# Patient Record
Sex: Female | Born: 1970
Health system: Southern US, Community
[De-identification: ages and names within clinical notes are randomized; demographics above are authoritative.]

## PROBLEM LIST (undated history)

## (undated) DIAGNOSIS — I519 Heart disease, unspecified: Secondary | ICD-10-CM

## (undated) DIAGNOSIS — Z85850 Personal history of malignant neoplasm of thyroid: Secondary | ICD-10-CM

## (undated) DIAGNOSIS — E663 Overweight: Secondary | ICD-10-CM

## (undated) DIAGNOSIS — Z9889 Other specified postprocedural states: Secondary | ICD-10-CM

## (undated) DIAGNOSIS — T7840XA Allergy, unspecified, initial encounter: Secondary | ICD-10-CM

## (undated) DIAGNOSIS — I2510 Atherosclerotic heart disease of native coronary artery without angina pectoris: Secondary | ICD-10-CM

## (undated) DIAGNOSIS — Z800 Family history of malignant neoplasm of digestive organs: Secondary | ICD-10-CM

## (undated) DIAGNOSIS — C801 Malignant (primary) neoplasm, unspecified: Secondary | ICD-10-CM

## (undated) DIAGNOSIS — M1990 Unspecified osteoarthritis, unspecified site: Secondary | ICD-10-CM

## (undated) DIAGNOSIS — R112 Nausea with vomiting, unspecified: Secondary | ICD-10-CM

## (undated) DIAGNOSIS — C50919 Malignant neoplasm of unspecified site of unspecified female breast: Secondary | ICD-10-CM

## (undated) DIAGNOSIS — K219 Gastro-esophageal reflux disease without esophagitis: Secondary | ICD-10-CM

## (undated) DIAGNOSIS — Z5189 Encounter for other specified aftercare: Secondary | ICD-10-CM

## (undated) DIAGNOSIS — D649 Anemia, unspecified: Secondary | ICD-10-CM

## (undated) DIAGNOSIS — E039 Hypothyroidism, unspecified: Secondary | ICD-10-CM

## (undated) HISTORY — DX: Anemia, unspecified: D64.9

## (undated) HISTORY — PX: COLONOSCOPY: SHX174

## (undated) HISTORY — DX: Gastro-esophageal reflux disease without esophagitis: K21.9

## (undated) HISTORY — PX: ULNAR NERVE REPAIR: SHX2594

## (undated) HISTORY — DX: Malignant neoplasm of unspecified site of unspecified female breast: C50.919

## (undated) HISTORY — DX: Heart disease, unspecified: I51.9

## (undated) HISTORY — DX: Unspecified osteoarthritis, unspecified site: M19.90

## (undated) HISTORY — DX: Nausea with vomiting, unspecified: R11.2

## (undated) HISTORY — DX: Other specified postprocedural states: Z98.890

## (undated) HISTORY — DX: Family history of malignant neoplasm of digestive organs: Z80.0

## (undated) HISTORY — DX: Malignant (primary) neoplasm, unspecified: C80.1

## (undated) HISTORY — PX: THYROIDECTOMY: SHX17

## (undated) HISTORY — DX: Encounter for other specified aftercare: Z51.89

## (undated) HISTORY — DX: Overweight: E66.3

## (undated) HISTORY — DX: Allergy, unspecified, initial encounter: T78.40XA

## (undated) HISTORY — DX: Gilbert syndrome: E80.4

## (undated) HISTORY — DX: Atherosclerotic heart disease of native coronary artery without angina pectoris: I25.10

## (undated) HISTORY — PX: OTHER SURGICAL HISTORY: SHX169

## (undated) HISTORY — DX: Personal history of malignant neoplasm of thyroid: Z85.850

## (undated) HISTORY — PX: UPPER GASTROINTESTINAL ENDOSCOPY: SHX188

## (undated) HISTORY — PX: BILATERAL TOTAL MASTECTOMY WITH AXILLARY LYMPH NODE DISSECTION: SHX6364

---
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
## 2001-11-16 ENCOUNTER — Ambulatory Visit (HOSPITAL_BASED_OUTPATIENT_CLINIC_OR_DEPARTMENT_OTHER): Admission: RE | Admit: 2001-11-16 | Discharge: 2001-11-16 | Payer: Self-pay | Admitting: Orthopaedic Surgery

## 2002-07-21 ENCOUNTER — Encounter: Admission: RE | Admit: 2002-07-21 | Discharge: 2002-07-21 | Payer: Self-pay | Admitting: Family Medicine

## 2002-07-21 ENCOUNTER — Encounter: Payer: Self-pay | Admitting: Family Medicine

## 2004-06-11 ENCOUNTER — Encounter: Admission: RE | Admit: 2004-06-11 | Discharge: 2004-06-11 | Payer: Self-pay | Admitting: Family Medicine

## 2004-08-05 ENCOUNTER — Other Ambulatory Visit: Admission: RE | Admit: 2004-08-05 | Discharge: 2004-08-05 | Payer: Self-pay | Admitting: Family Medicine

## 2004-10-14 ENCOUNTER — Encounter: Admission: RE | Admit: 2004-10-14 | Discharge: 2004-10-14 | Payer: Self-pay | Admitting: Family Medicine

## 2005-01-13 DIAGNOSIS — C801 Malignant (primary) neoplasm, unspecified: Secondary | ICD-10-CM

## 2005-01-13 HISTORY — DX: Malignant (primary) neoplasm, unspecified: C80.1

## 2006-01-07 ENCOUNTER — Other Ambulatory Visit: Admission: RE | Admit: 2006-01-07 | Discharge: 2006-01-07 | Payer: Self-pay | Admitting: Family Medicine

## 2006-08-18 ENCOUNTER — Emergency Department (HOSPITAL_COMMUNITY): Admission: EM | Admit: 2006-08-18 | Discharge: 2006-08-19 | Payer: Self-pay | Admitting: Emergency Medicine

## 2008-07-06 ENCOUNTER — Other Ambulatory Visit: Admission: RE | Admit: 2008-07-06 | Discharge: 2008-07-06 | Payer: Self-pay | Admitting: Family Medicine

## 2010-05-28 NOTE — Op Note (Signed)
NAMETEARIA, GIBBS NO.:  [id number]   MEDICAL RECORD NO.:  [id number]          PATIENT TYPE:  EMS   LOCATION:  MAJO                         FACILITY:  [facility]   PHYSICIAN:  Hermelinda Medicus, M.D.   DATE OF BIRTH:  Jan 31, 1970   DATE OF PROCEDURE:  08/19/2006  DATE OF DISCHARGE:                               OPERATIVE REPORT   This patient is a 40 year old female who, while at a baseball game, was  hit by a fly ball.  The ball hit her right in the frontal region, right  just below the hairline and causing a stellate crushing-type laceration.  The superior aspect of this was 4 cm, the inferior 3 cm, and the lateral  1 cm and 2 cm in size; with some crushing of tissue in the center, and  this cut right down to her skull.  There was no evidence of any skull  fracture.  She was not unconscious.  This was closed after cleansing  with Betadine and anesthetizing with 1% Xylocaine with epinephrine.  Closed with 5-0 chromic catgut subcutaneous, and then with 5-0 Ethilon.  Once this was completed, the area was shown to her husband where the  very center of the stellate laceration was somewhat tenuous in terms of  tissue damage, and could need to be revised.  However, she did very well  and Steri-Strips were applied.  A dressing was applied.  We will follow  with her on the second postoperative day, and then we will see her again  next week for suture removal.   MEDICATIONS:  She will go home with:  1. Amoxicillin 250 3 times a day.  2. Vicodin 1 tablet q.4-6 h. p.r.n.   ALLERGIES:  SHE HAS NO ALLERGIES TO MEDICATIONS.   DIAGNOSIS:  Stellate laceration of frontal region, trauma by a baseball.           ______________________________  Hermelinda Medicus, M.D.     JC/MEDQ  D:  08/19/2006  T:  08/19/2006  Job:  [id number]

## 2010-05-31 NOTE — Op Note (Signed)
NAME:  [name], [name]                      ACCOUNT NO.:  [id number]   MEDICAL RECORD NO.:  [id number]                   PATIENT TYPE:  AMB   LOCATION:  DSC                                  FACILITY:  [facility]   PHYSICIAN:  Lubertha Basque. Jerl Santos, M.D.             DATE OF BIRTH:  [birth year]   DATE OF PROCEDURE:  11/16/2001  DATE OF DISCHARGE:                                 OPERATIVE REPORT   PREOPERATIVE DIAGNOSIS:  Right ulnar nerve instability.   POSTOPERATIVE DIAGNOSIS:  Right ulnar nerve instability.   PROCEDURE:  Right elbow medial epicondylectomy.   ANESTHESIA:  General.   SURGEON:  Lubertha Basque. Jerl Santos, M.D.   ASSISTANT:  Lindwood Qua, P.A.   INDICATION FOR PROCEDURE:  The patient is a 40 year old woman 10 or 15 years  out from what sounds like an ulnar nerve decompression procedure.  She did  fairly well until over the last five years she developed a pop in her elbow  with flexion.  This would shoot a pain down into her hand.  This became  unpleasant to her.  This has persisted despite activity restriction and anti-  inflammatories.  She is offered a medial epicondylectomy with re-exploration  of the ulnar nerve.  The procedure was discussed with the patient and  informed operative consent was obtained after a discussion of the possible  complications of, reaction to anesthesia, infection, and neurovascular  injury.   DESCRIPTION OF PROCEDURE:  The patient was taken to the operating suite,  where a general anesthetic was applied without difficulty.  She was  positioned supine and prepped and draped in a normal sterile fashion.  After  the administration of preop IV antibiotics, the right arm was elevated,  exsanguinated, and a tourniquet was inflated about the upper arm.  Her old  incision was utilized with dissection down to an ulnar nerve which was  encased in scar tissue.  Once this was decompressed, upon flexion the ulnar  nerve could be seen to flip out of the  groove.  The flexor-pronator origin  was dissected off the medial epicondyle in anterior and posterior  directions.  An oscillating saw was used to perform a medial  epicondylectomy.  The edge of this was smoothed with a rongeur, followed by  repair of the flexor-pronator origin with Vicryl suture.  At this point the  elbow moved well and the ulnar nerve no longer subluxed over any prominence.  Dissection was carried and taken proximally to the intermuscular septum,  which was released around the nerve and distally into the FCU muscle belly,  where the nerve seemed to be very irritated.  The wound was again irrigated,  followed by release of the tourniquet.  The arm became pink and warm  immediately, and a small amount of bleeding was easily controlled with Bovie  cautery.  Undyed 2-0 Vicryl was used to reapproximate the subcutaneous  tissues, followed by a subcuticular Vicryl  and Steri-Strips.  Marcaine was  injected about the wound, followed by dry gauze and a posterior splint of  plaster with the elbow in neutral position.  Estimated blood loss and  intraoperative fluid as well as accurate tourniquet time can be obtained  from anesthesia records.    DISPOSITION:  The patient was extubated in the operating room and taken to  the recovery room in stable condition.  Plans were for her to go home the  same day and follow up in the office in less than a week.  I will contact  her by phone tonight.                                               Lubertha Basque Jerl Santos, M.D.    PGD/MEDQ  D:  11/16/2001  T:  11/16/2001  Job:  [id number]

## 2010-09-11 ENCOUNTER — Emergency Department (INDEPENDENT_AMBULATORY_CARE_PROVIDER_SITE_OTHER): Payer: BC Managed Care – PPO

## 2010-09-11 ENCOUNTER — Emergency Department (HOSPITAL_BASED_OUTPATIENT_CLINIC_OR_DEPARTMENT_OTHER)
Admission: EM | Admit: 2010-09-11 | Discharge: 2010-09-11 | Disposition: A | Discharge status: Other Acute Inpatient Hospital | Payer: BC Managed Care – PPO | Source: Home / Self Care | Attending: Emergency Medicine | Admitting: Emergency Medicine

## 2010-09-11 ENCOUNTER — Inpatient Hospital Stay (HOSPITAL_COMMUNITY)
Admission: AD | Admit: 2010-09-11 | Discharge: 2010-09-16 | DRG: 546 | Disposition: A | Discharge status: Home / Self Care | Payer: BC Managed Care – PPO | Source: Other Acute Inpatient Hospital | Attending: Cardiothoracic Surgery | Admitting: Cardiothoracic Surgery

## 2010-09-11 ENCOUNTER — Encounter: Payer: Self-pay | Admitting: *Deleted

## 2010-09-11 DIAGNOSIS — E039 Hypothyroidism, unspecified: Secondary | ICD-10-CM | POA: Insufficient documentation

## 2010-09-11 DIAGNOSIS — R55 Syncope and collapse: Secondary | ICD-10-CM | POA: Diagnosis present

## 2010-09-11 DIAGNOSIS — R0602 Shortness of breath: Secondary | ICD-10-CM | POA: Insufficient documentation

## 2010-09-11 DIAGNOSIS — I214 Non-ST elevation (NSTEMI) myocardial infarction: Secondary | ICD-10-CM | POA: Diagnosis present

## 2010-09-11 DIAGNOSIS — Z85850 Personal history of malignant neoplasm of thyroid: Secondary | ICD-10-CM

## 2010-09-11 DIAGNOSIS — I472 Ventricular tachycardia, unspecified: Secondary | ICD-10-CM | POA: Diagnosis not present

## 2010-09-11 DIAGNOSIS — R079 Chest pain, unspecified: Secondary | ICD-10-CM

## 2010-09-11 DIAGNOSIS — E876 Hypokalemia: Secondary | ICD-10-CM | POA: Diagnosis present

## 2010-09-11 DIAGNOSIS — E785 Hyperlipidemia, unspecified: Secondary | ICD-10-CM | POA: Diagnosis present

## 2010-09-11 DIAGNOSIS — K219 Gastro-esophageal reflux disease without esophagitis: Secondary | ICD-10-CM | POA: Diagnosis present

## 2010-09-11 DIAGNOSIS — R42 Dizziness and giddiness: Secondary | ICD-10-CM | POA: Insufficient documentation

## 2010-09-11 DIAGNOSIS — D62 Acute posthemorrhagic anemia: Secondary | ICD-10-CM | POA: Diagnosis not present

## 2010-09-11 DIAGNOSIS — I2542 Coronary artery dissection: Principal | ICD-10-CM | POA: Diagnosis present

## 2010-09-11 DIAGNOSIS — I4729 Other ventricular tachycardia: Secondary | ICD-10-CM | POA: Diagnosis not present

## 2010-09-11 HISTORY — DX: Hypothyroidism, unspecified: E03.9

## 2010-09-11 LAB — BASIC METABOLIC PANEL
BUN: 14 mg/dL (ref 6–23)
CO2: 21 mEq/L (ref 19–32)
Calcium: 8.7 mg/dL (ref 8.4–10.5)
Chloride: 106 mEq/L (ref 96–112)
Creatinine, Ser: 1 mg/dL (ref 0.50–1.10)
GFR calc Af Amer: >60 mL/min (ref >60)
GFR calc non Af Amer: >60 mL/min (ref >60)
Glucose, Bld: 98 mg/dL (ref 70–99)
Potassium: 3.4 mEq/L — ABNORMAL LOW (ref 3.5–5.1)
Sodium: 138 mEq/L (ref 135–145)

## 2010-09-11 LAB — URINALYSIS, ROUTINE W REFLEX MICROSCOPIC
Bilirubin Urine: NEGATIVE
Glucose, UA: NEGATIVE mg/dL
Hgb urine dipstick: NEGATIVE
Ketones, ur: NEGATIVE mg/dL
Nitrite: NEGATIVE
Protein, ur: NEGATIVE mg/dL
Specific Gravity, Urine: 1.008 (ref 1.005–1.030)
Urobilinogen, UA: 0.2 mg/dL (ref 0.0–1.0)
pH: 7 (ref 5.0–8.0)

## 2010-09-11 LAB — CARDIAC PANEL(CRET KIN+CKTOT+MB+TROPI)
CK, MB: 3.8 ng/mL (ref 0.3–4.0)
Relative Index: 2.9 — ABNORMAL HIGH (ref 0.0–2.5)
Total CK: 131 U/L (ref 7–177)
Troponin I: <0.3 ng/mL (ref <0.30)

## 2010-09-11 LAB — TROPONIN I: Troponin I: <0.3 ng/mL (ref <0.30)

## 2010-09-11 LAB — URINE MICROSCOPIC-ADD ON

## 2010-09-11 LAB — CBC
HCT: 37.7 % (ref 36.0–46.0)
Hemoglobin: 13.3 g/dL (ref 12.0–15.0)
MCH: 28.9 pg (ref 26.0–34.0)
MCHC: 35.3 g/dL (ref 30.0–36.0)
MCV: 81.8 fL (ref 78.0–100.0)
Platelets: 321 10*3/uL (ref 150–400)
RBC: 4.61 MIL/uL (ref 3.87–5.11)
RDW: 12.9 % (ref 11.5–15.5)
WBC: 16.2 10*3/uL — ABNORMAL HIGH (ref 4.0–10.5)

## 2010-09-11 MED ORDER — SODIUM CHLORIDE 0.9 % IV SOLN
Freq: Once | INTRAVENOUS | Status: AC
  Administered 2010-09-11: 15:00:00 via INTRAVENOUS

## 2010-09-11 MED ORDER — ENOXAPARIN SODIUM 100 MG/ML ~~LOC~~ SOLN: 1.0000 mg/kg | Freq: Once | SUBCUTANEOUS | Status: DC

## 2010-09-11 MED ORDER — SODIUM CHLORIDE 0.9 % IV SOLN
Freq: Once | INTRAVENOUS | Status: AC
  Administered 2010-09-11: 16:00:00 via INTRAVENOUS

## 2010-09-11 MED ORDER — ENOXAPARIN SODIUM 150 MG/ML ~~LOC~~ SOLN
SUBCUTANEOUS | Status: AC
  Administered 2010-09-11: 97 mg
  Filled 2010-09-11: qty 1

## 2010-09-11 NOTE — ED Notes (Signed)
Iv charted "removed" for documentation purposes only. IV remains intact at time of transfer. Report given to [facility] and they are on their way for transport

## 2010-09-11 NOTE — ED Notes (Signed)
Patient is resting comfortably.family at side pr reports "feeling much better Now" denies any discomfort Care plan reviewed No distress Improved overall Vital signs stable

## 2010-09-11 NOTE — ED Notes (Signed)
Report given to Tai RN on 4700. Pt transported to bed 4729 by [facility] who are here for transport.

## 2010-09-11 NOTE — ED Provider Notes (Signed)
History     CSN: [id number] Arrival date & time: 09/11/2010  2:19 PM  Chief Complaint  Patient presents with  . Chest Pain    Pt reports chest pain and diaphoresis while running today resolved,given asa 325mg  and .4SL Nitro per EMS   3/10 chest   Patient is a 40 y.o. female presenting with chest pain. The history is provided by the patient and the EMS personnel.  Chest Pain The chest pain began 1 - 2 hours ago. Duration of episode(s) is 1 hour. Chest pain occurs constantly. The chest pain is improving. The pain is associated with exertion. The severity of the pain is moderate. The quality of the pain is described as tightness (to throat/neck area, upper sternum). Primary symptoms include fatigue, shortness of breath, nausea and dizziness. Pertinent negatives for primary symptoms include no fever, no syncope, no cough, no wheezing, no palpitations, no abdominal pain and no vomiting.  Dizziness also occurs with nausea and diaphoresis. Dizziness does not occur with vomiting or weakness.   Associated symptoms include diaphoresis and near-syncope.  Pertinent negatives for associated symptoms include no lower extremity edema, no numbness and no weakness. She tried nitroglycerin and aspirin (given by EMS PTA) for the symptoms. Risk factors include obesity.  Her past medical history is significant for thyroid problem.  Her family medical history is significant for heart disease in family.  Procedure history is negative for cardiac catheterization, stress echo and exercise treadmill test.     Past Medical History  Diagnosis Date  . Hypothyroid     Past Surgical History  Procedure Date  . Thyroidectomy     History reviewed. No pertinent family history.  History  Substance Use Topics  . Smoking status: Never Smoker   . Smokeless tobacco: Not on file  . Alcohol Use: No    OB History    Grav Para Term Preterm Abortions TAB SAB Ect Mult Living                  Review of Systems    Constitutional: Positive for diaphoresis and fatigue. Negative for fever and chills.  HENT: Negative for congestion, rhinorrhea and sneezing.   Eyes: Negative.   Respiratory: Positive for shortness of breath. Negative for cough, chest tightness and wheezing.   Cardiovascular: Positive for chest pain and near-syncope. Negative for palpitations, leg swelling and syncope.  Gastrointestinal: Positive for nausea. Negative for vomiting, abdominal pain, diarrhea and blood in stool.  Genitourinary: Negative for frequency, hematuria, flank pain and difficulty urinating.  Musculoskeletal: Negative for back pain and arthralgias.  Skin: Positive for pallor. Negative for rash.  Neurological: Positive for dizziness. Negative for speech difficulty, weakness, numbness and headaches.    Physical Exam  BP 91/55  Pulse 82  Temp(Src) 97.9 F (36.6 C) (Oral)  Resp 18  SpO2 100%  Physical Exam  Constitutional: She is oriented to person, place, and time. She appears well-developed and well-nourished.  HENT:  Head: Normocephalic and atraumatic.  Eyes: Pupils are equal, round, and reactive to light.  Neck: Normal range of motion. Neck supple. No tracheal deviation present. No thyromegaly present.  Cardiovascular: Normal rate, regular rhythm, normal heart sounds and intact distal pulses.  Exam reveals no gallop and no friction rub.   No murmur heard. Pulmonary/Chest: Effort normal and breath sounds normal. No respiratory distress. She has no wheezes. She has no rales. She exhibits no tenderness.  Abdominal: Soft. Bowel sounds are normal. There is no tenderness. There is no rebound  and no guarding.  Musculoskeletal: Normal range of motion. She exhibits no edema and no tenderness.  Lymphadenopathy:    She has no cervical adenopathy.  Neurological: She is alert and oriented to person, place, and time.  Skin: Skin is warm and dry. No rash noted. There is pallor.  Psychiatric: She has a normal mood and affect.     ED Course  Procedures  Pt hypotensive on arrival with BP in 90s, dropped down to 70s.  HR was dropping down intor 30s, now stable in 70s-80s.  Pt's tightness to throat improving.  Getting fluid bolus, on oxygen  MDM     Results for orders placed during the hospital encounter of 09/11/10  CBC      Component Value Range   WBC 16.2 (*) 4.0 - 10.5 (K/uL)   RBC 4.61  3.87 - 5.11 (MIL/uL)   Hemoglobin 13.3  12.0 - 15.0 (g/dL)   HCT 16.1  09.6 - 04.5 (%)   MCV 81.8  78.0 - 100.0 (fL)   MCH 28.9  26.0 - 34.0 (pg)   MCHC 35.3  30.0 - 36.0 (g/dL)   RDW 40.9  81.1 - 91.4 (%)   Platelets 321  150 - 400 (K/uL)  BASIC METABOLIC PANEL      Component Value Range   Sodium 138  135 - 145 (mEq/L)   Potassium 3.4 (*) 3.5 - 5.1 (mEq/L)   Chloride 106  96 - 112 (mEq/L)   CO2 21  19 - 32 (mEq/L)   Glucose, Bld 98  70 - 99 (mg/dL)   BUN 14  6 - 23 (mg/dL)   Creatinine, Ser 7.82  0.50 - 1.10 (mg/dL)   Calcium 8.7  8.4 - 95.6 (mg/dL)   GFR calc non Af Amer >60  >60 (mL/min)   GFR calc Af Amer >60  >60 (mL/min)  TROPONIN I      Component Value Range   Troponin I <0.30  <0.30 (ng/mL)  CARDIAC PANEL(CRET KIN+CKTOT+MB+TROPI)      Component Value Range   Total CK 131  7 - 177 (U/L)   CK, MB 3.8  0.3 - 4.0 (ng/mL)   Troponin I <0.30  <0.30 (ng/mL)   Relative Index 2.9 (*) 0.0 - 2.5    Dg Chest 2 View  09/11/2010  *RADIOLOGY REPORT*  Clinical Data: Chest pain.  CHEST - 2 VIEW  Comparison: None  Findings: The cardiac silhouette, mediastinal and hilar contours are within normal limits.  The lungs are clear.  The bony thorax is intact.  IMPRESSION: No acute cardiopulmonary findings.  Original Report Authenticated By: P. Loralie Champagne, M.D.    EKG: there are no previous tracings available for comparison, sinus bradycardia, nl axis, no ST/T wave changes.  Discussed with Hospitalist at [facility], will be admitted to [facility], MD 09/11/10 [phone]

## 2010-09-11 NOTE — ED Notes (Signed)
Report received from [facility]. Assuming care of pt at this time. Pt denies CP or associated symptoms. Vitals stable. Family at bedside. Pt aware of plans for admission.

## 2010-09-11 NOTE — ED Notes (Signed)
MD at bedside.Variable heart rate with drop in Blood Pressure MD in at side Fluid bolus in progress

## 2010-09-11 NOTE — ED Notes (Signed)
Pt had 7-10 minute episode of chest pain with diaphoresis/today while running

## 2010-09-11 NOTE — ED Notes (Signed)
Pt reports onset of mid sternal chest pain/ tightness. Began around 12:30 while running/ walking. Pt denies any hx of heart disease. Non smoker. At moment pt reports still some chest tightness but no pain.

## 2010-09-12 ENCOUNTER — Inpatient Hospital Stay (HOSPITAL_COMMUNITY): Payer: BC Managed Care – PPO

## 2010-09-12 DIAGNOSIS — I214 Non-ST elevation (NSTEMI) myocardial infarction: Secondary | ICD-10-CM

## 2010-09-12 DIAGNOSIS — I2510 Atherosclerotic heart disease of native coronary artery without angina pectoris: Secondary | ICD-10-CM

## 2010-09-12 LAB — POCT I-STAT 4, (NA,K, GLUC, HGB,HCT)
Glucose, Bld: 93 mg/dL (ref 70–99)
Glucose, Bld: 94 mg/dL (ref 70–99)
Glucose, Bld: 93 mg/dL (ref 70–99)
Glucose, Bld: 109 mg/dL — ABNORMAL HIGH (ref 70–99)
HCT: 32 % — ABNORMAL LOW (ref 36.0–46.0)
HCT: 25 % — ABNORMAL LOW (ref 36.0–46.0)
HCT: 30 % — ABNORMAL LOW (ref 36.0–46.0)
HCT: 34 % — ABNORMAL LOW (ref 36.0–46.0)
Hemoglobin: 10.9 g/dL — ABNORMAL LOW (ref 12.0–15.0)
Hemoglobin: 10.2 g/dL — ABNORMAL LOW (ref 12.0–15.0)
Hemoglobin: 8.8 g/dL — ABNORMAL LOW (ref 12.0–15.0)
Hemoglobin: 11.6 g/dL — ABNORMAL LOW (ref 12.0–15.0)
Potassium: 3.8 mEq/L (ref 3.5–5.1)
Potassium: 4.3 mEq/L (ref 3.5–5.1)
Potassium: 3.6 mEq/L (ref 3.5–5.1)
Sodium: 140 mEq/L (ref 135–145)
Sodium: 140 mEq/L (ref 135–145)
Sodium: 141 mEq/L (ref 135–145)
Sodium: 148 mEq/L — ABNORMAL HIGH (ref 135–145)

## 2010-09-12 LAB — POCT I-STAT 3, ART BLOOD GAS (G3+)
Acid-base deficit: 5 mmol/L — ABNORMAL HIGH (ref 0.0–2.0)
Acid-base deficit: 5 mmol/L — ABNORMAL HIGH (ref 0.0–2.0)
Acid-base deficit: 5 mmol/L — ABNORMAL HIGH (ref 0.0–2.0)
Bicarbonate: 24.6 mEq/L — ABNORMAL HIGH (ref 20.0–24.0)
Bicarbonate: 19.4 mEq/L — ABNORMAL LOW (ref 20.0–24.0)
O2 Saturation: 97 %
O2 Saturation: 98 %
Patient temperature: 37
TCO2: 21 mmol/L (ref 0–100)
TCO2: 20 mmol/L (ref 0–100)
pCO2 arterial: 38.4 mmHg (ref 35.0–45.0)
pCO2 arterial: 41 mmHg (ref 35.0–45.0)
pH, Arterial: 7.318 — ABNORMAL LOW (ref 7.350–7.400)
pH, Arterial: 7.386 (ref 7.350–7.400)
pH, Arterial: 7.39 (ref 7.350–7.400)
pO2, Arterial: 494 mmHg — ABNORMAL HIGH (ref 80.0–100.0)
pO2, Arterial: 365 mmHg — ABNORMAL HIGH (ref 80.0–100.0)
pO2, Arterial: 414 mmHg — ABNORMAL HIGH (ref 80.0–100.0)
pO2, Arterial: 90 mmHg (ref 80.0–100.0)

## 2010-09-12 LAB — CBC
HCT: 34.3 % — ABNORMAL LOW (ref 36.0–46.0)
HCT: 28.3 % — ABNORMAL LOW (ref 36.0–46.0)
HCT: 30.3 % — ABNORMAL LOW (ref 36.0–46.0)
Hemoglobin: 9.7 g/dL — ABNORMAL LOW (ref 12.0–15.0)
Hemoglobin: 10.4 g/dL — ABNORMAL LOW (ref 12.0–15.0)
MCH: 28 pg (ref 26.0–34.0)
MCH: 28 pg (ref 26.0–34.0)
MCHC: 34.3 g/dL (ref 30.0–36.0)
MCHC: 34.3 g/dL (ref 30.0–36.0)
MCV: 81.1 fL (ref 78.0–100.0)
MCV: 81.8 fL (ref 78.0–100.0)
MCV: 81.7 fL (ref 78.0–100.0)
Platelets: 256 10*3/uL (ref 150–400)
Platelets: 263 10*3/uL (ref 150–400)
Platelets: 182 10*3/uL (ref 150–400)
Platelets: 207 10*3/uL (ref 150–400)
RBC: 4.41 MIL/uL (ref 3.87–5.11)
RBC: 4.23 MIL/uL (ref 3.87–5.11)
RBC: 3.46 MIL/uL — ABNORMAL LOW (ref 3.87–5.11)
RBC: 3.71 MIL/uL — ABNORMAL LOW (ref 3.87–5.11)
RDW: 12.8 % (ref 11.5–15.5)
RDW: 12.8 % (ref 11.5–15.5)
RDW: 12.9 % (ref 11.5–15.5)
RDW: 13 % (ref 11.5–15.5)
WBC: 10.1 10*3/uL (ref 4.0–10.5)
WBC: 8.4 10*3/uL (ref 4.0–10.5)
WBC: 14.6 10*3/uL — ABNORMAL HIGH (ref 4.0–10.5)
WBC: 18.7 10*3/uL — ABNORMAL HIGH (ref 4.0–10.5)

## 2010-09-12 LAB — CK TOTAL AND CKMB (NOT AT ARMC)
CK, MB: 25 ng/mL (ref 0.3–4.0)
Total CK: 291 U/L — ABNORMAL HIGH (ref 7–177)

## 2010-09-12 LAB — MRSA PCR SCREENING
MRSA by PCR: NEGATIVE
MRSA by PCR: NEGATIVE

## 2010-09-12 LAB — MAGNESIUM: Magnesium: 2.7 mg/dL — ABNORMAL HIGH (ref 1.5–2.5)

## 2010-09-12 LAB — POCT I-STAT, CHEM 8
BUN: 6 mg/dL (ref 6–23)
Calcium, Ion: 1.08 mmol/L — ABNORMAL LOW (ref 1.12–1.32)
Creatinine, Ser: 0.8 mg/dL (ref 0.50–1.10)
Glucose, Bld: 134 mg/dL — ABNORMAL HIGH (ref 70–99)
Hemoglobin: 9.9 g/dL — ABNORMAL LOW (ref 12.0–15.0)
Sodium: 139 mEq/L (ref 135–145)
TCO2: 18 mmol/L (ref 0–100)

## 2010-09-12 LAB — CARDIAC PANEL(CRET KIN+CKTOT+MB+TROPI)
CK, MB: 41.1 ng/mL (ref 0.3–4.0)
Relative Index: 9.4 — ABNORMAL HIGH (ref 0.0–2.5)
Relative Index: 8.5 — ABNORMAL HIGH (ref 0.0–2.5)
Total CK: 436 U/L — ABNORMAL HIGH (ref 7–177)
Total CK: 293 U/L — ABNORMAL HIGH (ref 7–177)
Troponin I: 13.56 ng/mL (ref <0.30)

## 2010-09-12 LAB — PROTIME-INR
INR: 1.1 (ref 0.00–1.49)
INR: 4.07 — ABNORMAL HIGH (ref 0.00–1.49)
INR: 1.44 (ref 0.00–1.49)
Prothrombin Time: 14.4 seconds (ref 11.6–15.2)
Prothrombin Time: 17.8 seconds — ABNORMAL HIGH (ref 11.6–15.2)

## 2010-09-12 LAB — CREATININE, SERUM
Creatinine, Ser: 0.73 mg/dL (ref 0.50–1.10)
GFR calc Af Amer: >60 mL/min (ref >60)
GFR calc non Af Amer: >60 mL/min (ref >60)

## 2010-09-12 LAB — BASIC METABOLIC PANEL
CO2: 19 mEq/L (ref 19–32)
Chloride: 110 mEq/L (ref 96–112)
GFR calc Af Amer: >60 mL/min (ref >60)
Sodium: 139 mEq/L (ref 135–145)

## 2010-09-12 LAB — GLUCOSE, CAPILLARY
Glucose-Capillary: 116 mg/dL — ABNORMAL HIGH (ref 70–99)
Glucose-Capillary: 108 mg/dL — ABNORMAL HIGH (ref 70–99)

## 2010-09-12 LAB — HEMOGLOBIN A1C
Hgb A1c MFr Bld: 5.3 % (ref <5.7)
Mean Plasma Glucose: 105 mg/dL (ref <117)

## 2010-09-12 LAB — TYPE AND SCREEN: ABO/RH(D): O POS

## 2010-09-12 LAB — PLATELET COUNT: Platelets: 257 10*3/uL (ref 150–400)

## 2010-09-12 LAB — PREGNANCY, URINE
Preg Test, Ur: NEGATIVE
Preg Test, Ur: NEGATIVE

## 2010-09-12 LAB — LIPID PANEL
Cholesterol: 145 mg/dL (ref 0–200)
Total CHOL/HDL Ratio: 4.8 RATIO
Triglycerides: 124 mg/dL (ref <150)
VLDL: 25 mg/dL (ref 0–40)

## 2010-09-12 LAB — TSH: TSH: 0.087 u[IU]/mL — ABNORMAL LOW (ref 0.350–4.500)

## 2010-09-12 LAB — APTT
aPTT: 102 seconds — ABNORMAL HIGH (ref 24–37)
aPTT: 38 seconds — ABNORMAL HIGH (ref 24–37)

## 2010-09-12 LAB — POCT ACTIVATED CLOTTING TIME
Activated Clotting Time: 133 seconds
Activated Clotting Time: 380 seconds

## 2010-09-12 LAB — D-DIMER, QUANTITATIVE: D-Dimer, Quant: 0.46 ug/mL-FEU (ref 0.00–0.48)

## 2010-09-12 LAB — TROPONIN I: Troponin I: 3.13 ng/mL (ref <0.30)

## 2010-09-12 LAB — ABO/RH: ABO/RH(D): O POS

## 2010-09-13 ENCOUNTER — Inpatient Hospital Stay (HOSPITAL_COMMUNITY): Payer: BC Managed Care – PPO

## 2010-09-13 DIAGNOSIS — IMO0001 Reserved for inherently not codable concepts without codable children: Secondary | ICD-10-CM

## 2010-09-13 DIAGNOSIS — E1165 Type 2 diabetes mellitus with hyperglycemia: Secondary | ICD-10-CM

## 2010-09-13 HISTORY — PX: OTHER SURGICAL HISTORY: SHX169

## 2010-09-13 LAB — CBC
HCT: 27.8 % — ABNORMAL LOW (ref 36.0–46.0)
HCT: 28.8 % — ABNORMAL LOW (ref 36.0–46.0)
Hemoglobin: 9.4 g/dL — ABNORMAL LOW (ref 12.0–15.0)
Hemoglobin: 10 g/dL — ABNORMAL LOW (ref 12.0–15.0)
MCH: 27.9 pg (ref 26.0–34.0)
MCH: 28.7 pg (ref 26.0–34.0)
MCHC: 33.8 g/dL (ref 30.0–36.0)
MCHC: 34.7 g/dL (ref 30.0–36.0)
MCV: 82.5 fL (ref 78.0–100.0)
MCV: 82.8 fL (ref 78.0–100.0)
Platelets: 182 10*3/uL (ref 150–400)
Platelets: 245 10*3/uL (ref 150–400)
RBC: 3.37 MIL/uL — ABNORMAL LOW (ref 3.87–5.11)
RBC: 3.48 MIL/uL — ABNORMAL LOW (ref 3.87–5.11)
RDW: 12.9 % (ref 11.5–15.5)
RDW: 13.5 % (ref 11.5–15.5)
WBC: 16.6 10*3/uL — ABNORMAL HIGH (ref 4.0–10.5)
WBC: 17.9 10*3/uL — ABNORMAL HIGH (ref 4.0–10.5)

## 2010-09-13 LAB — T4: T4, Total: 5.3 ug/dL (ref 5.0–12.5)

## 2010-09-13 LAB — POCT I-STAT 3, ART BLOOD GAS (G3+)
Bicarbonate: 17.3 mEq/L — ABNORMAL LOW (ref 20.0–24.0)
TCO2: 19 mmol/L (ref 0–100)
pCO2 arterial: 50.2 mmHg — ABNORMAL HIGH (ref 35.0–45.0)
pH, Arterial: 7.143 — CL (ref 7.350–7.400)

## 2010-09-13 LAB — GLUCOSE, CAPILLARY
Glucose-Capillary: 138 mg/dL — ABNORMAL HIGH (ref 70–99)
Glucose-Capillary: 121 mg/dL — ABNORMAL HIGH (ref 70–99)
Glucose-Capillary: 110 mg/dL — ABNORMAL HIGH (ref 70–99)
Glucose-Capillary: 114 mg/dL — ABNORMAL HIGH (ref 70–99)
Glucose-Capillary: 140 mg/dL — ABNORMAL HIGH (ref 70–99)

## 2010-09-13 LAB — BASIC METABOLIC PANEL
BUN: 8 mg/dL (ref 6–23)
CO2: 21 mEq/L (ref 19–32)
Calcium: 7.4 mg/dL — ABNORMAL LOW (ref 8.4–10.5)
Chloride: 105 mEq/L (ref 96–112)
Creatinine, Ser: 0.68 mg/dL (ref 0.50–1.10)
GFR calc Af Amer: >60 mL/min (ref >60)
GFR calc non Af Amer: >60 mL/min (ref >60)
Glucose, Bld: 114 mg/dL — ABNORMAL HIGH (ref 70–99)
Potassium: 3.5 mEq/L (ref 3.5–5.1)
Sodium: 134 mEq/L — ABNORMAL LOW (ref 135–145)

## 2010-09-13 LAB — CREATININE, SERUM
Creatinine, Ser: 0.72 mg/dL (ref 0.50–1.10)
GFR calc Af Amer: >60 mL/min (ref >60)
GFR calc non Af Amer: >60 mL/min (ref >60)

## 2010-09-13 LAB — POCT I-STAT, CHEM 8
Creatinine, Ser: 0.9 mg/dL (ref 0.50–1.10)
HCT: 29 % — ABNORMAL LOW (ref 36.0–46.0)
Hemoglobin: 9.9 g/dL — ABNORMAL LOW (ref 12.0–15.0)
Potassium: 3.5 mEq/L (ref 3.5–5.1)
Sodium: 138 mEq/L (ref 135–145)

## 2010-09-13 LAB — CARDIAC PANEL(CRET KIN+CKTOT+MB+TROPI)
CK, MB: 14.5 ng/mL (ref 0.3–4.0)
Relative Index: 4.7 — ABNORMAL HIGH (ref 0.0–2.5)
Total CK: 307 U/L — ABNORMAL HIGH (ref 7–177)
Troponin I: 2.74 ng/mL (ref <0.30)

## 2010-09-13 LAB — T4, FREE: Free T4: 1.29 ng/dL (ref 0.80–1.80)

## 2010-09-13 LAB — MAGNESIUM
Magnesium: 2.4 mg/dL (ref 1.5–2.5)
Magnesium: 2.3 mg/dL (ref 1.5–2.5)

## 2010-09-13 LAB — TSH: TSH: 0.049 u[IU]/mL — ABNORMAL LOW (ref 0.350–4.500)

## 2010-09-14 ENCOUNTER — Inpatient Hospital Stay (HOSPITAL_COMMUNITY): Payer: BC Managed Care – PPO

## 2010-09-14 LAB — GLUCOSE, CAPILLARY
Glucose-Capillary: 113 mg/dL — ABNORMAL HIGH (ref 70–99)
Glucose-Capillary: 115 mg/dL — ABNORMAL HIGH (ref 70–99)

## 2010-09-14 LAB — BASIC METABOLIC PANEL
BUN: 9 mg/dL (ref 6–23)
CO2: 25 mEq/L (ref 19–32)
Calcium: 7.7 mg/dL — ABNORMAL LOW (ref 8.4–10.5)
Chloride: 106 mEq/L (ref 96–112)
Creatinine, Ser: 0.76 mg/dL (ref 0.50–1.10)
GFR calc Af Amer: >60 mL/min (ref >60)
GFR calc non Af Amer: >60 mL/min (ref >60)
Glucose, Bld: 111 mg/dL — ABNORMAL HIGH (ref 70–99)
Potassium: 3.5 mEq/L (ref 3.5–5.1)
Sodium: 137 mEq/L (ref 135–145)

## 2010-09-14 LAB — CBC
HCT: 27.8 % — ABNORMAL LOW (ref 36.0–46.0)
Hemoglobin: 9.4 g/dL — ABNORMAL LOW (ref 12.0–15.0)
MCH: 28.1 pg (ref 26.0–34.0)
MCHC: 33.8 g/dL (ref 30.0–36.0)
MCV: 83.2 fL (ref 78.0–100.0)
Platelets: 216 10*3/uL (ref 150–400)
RBC: 3.34 MIL/uL — ABNORMAL LOW (ref 3.87–5.11)
RDW: 13.4 % (ref 11.5–15.5)
WBC: 14.5 10*3/uL — ABNORMAL HIGH (ref 4.0–10.5)

## 2010-09-15 ENCOUNTER — Inpatient Hospital Stay (HOSPITAL_COMMUNITY): Payer: BC Managed Care – PPO

## 2010-09-15 LAB — GLUCOSE, CAPILLARY
Glucose-Capillary: 113 mg/dL — ABNORMAL HIGH (ref 70–99)
Glucose-Capillary: 100 mg/dL — ABNORMAL HIGH (ref 70–99)
Glucose-Capillary: 104 mg/dL — ABNORMAL HIGH (ref 70–99)
Glucose-Capillary: 113 mg/dL — ABNORMAL HIGH (ref 70–99)

## 2010-09-15 LAB — CBC
HCT: 28.2 % — ABNORMAL LOW (ref 36.0–46.0)
Hemoglobin: 9.4 g/dL — ABNORMAL LOW (ref 12.0–15.0)
MCH: 27.7 pg (ref 26.0–34.0)
MCHC: 33.3 g/dL (ref 30.0–36.0)
MCV: 83.2 fL (ref 78.0–100.0)
Platelets: 216 10*3/uL (ref 150–400)
RBC: 3.39 MIL/uL — ABNORMAL LOW (ref 3.87–5.11)
RDW: 13.3 % (ref 11.5–15.5)
WBC: 10.3 10*3/uL (ref 4.0–10.5)

## 2010-09-15 LAB — BASIC METABOLIC PANEL
BUN: 6 mg/dL (ref 6–23)
CO2: 25 mEq/L (ref 19–32)
Calcium: 8.2 mg/dL — ABNORMAL LOW (ref 8.4–10.5)
Chloride: 107 mEq/L (ref 96–112)
Creatinine, Ser: 0.67 mg/dL (ref 0.50–1.10)
GFR calc Af Amer: >60 mL/min (ref >60)
GFR calc non Af Amer: >60 mL/min (ref >60)
Glucose, Bld: 96 mg/dL (ref 70–99)
Potassium: 3.3 mEq/L — ABNORMAL LOW (ref 3.5–5.1)
Sodium: 139 mEq/L (ref 135–145)

## 2010-09-16 LAB — BASIC METABOLIC PANEL
BUN: 6 mg/dL (ref 6–23)
CO2: 24 mEq/L (ref 19–32)
Calcium: 8.6 mg/dL (ref 8.4–10.5)
Chloride: 103 mEq/L (ref 96–112)
Creatinine, Ser: 0.75 mg/dL (ref 0.50–1.10)
GFR calc Af Amer: >60 mL/min (ref >60)
GFR calc non Af Amer: >60 mL/min (ref >60)
Glucose, Bld: 98 mg/dL (ref 70–99)
Potassium: 3.5 mEq/L (ref 3.5–5.1)
Sodium: 136 mEq/L (ref 135–145)

## 2010-09-17 LAB — POCT I-STAT 3, ART BLOOD GAS (G3+)
Acid-base deficit: 2 mmol/L (ref 0.0–2.0)
Patient temperature: 36.2
pH, Arterial: 7.431 — ABNORMAL HIGH (ref 7.350–7.400)

## 2010-09-20 ENCOUNTER — Other Ambulatory Visit: Payer: Self-pay | Admitting: *Deleted

## 2010-09-25 NOTE — Cardiovascular Report (Signed)
[name], [name] NO.:  [id number]  MEDICAL RECORD NO.:  [id number]  LOCATION:  2905                         FACILITY:  [facility]  PHYSICIAN:  Arturo Morton. Riley Kill, MD, FACCDATE OF BIRTH:  December 27, 1970  DATE OF PROCEDURE: DATE OF DISCHARGE:                           CARDIAC CATHETERIZATION   INDICATIONS:  [name] is a 40 year old who presented to the emergency room after running with chest pain.  There were no definite electrocardiographic changes, but her troponin was 13.  She was seen by Dr. Gala Romney, set up for urgent cardiac catheterization.  PROCEDURE: 1. Left heart catheterization. 2. Selective coronary arteriography. 3. Selective left ventriculography. 4. Intravascular ultrasound to the right coronary artery.  DESCRIPTION OF THE PROCEDURE:  The patient was brought to the catheterization laboratory and prepped and draped in the usual fashion. We had initially planned to use the radial approach, but with potential that the patient might have spontaneous coronary dissection based on her clinical history, I elected to use the femoral approach.  Following this, ventriculography was performed in the RAO projection followed by proximal root aortography to identify other pathology.  Following this, a shot of the right coronary artery was obtained.  Intracoronary nitroglycerin was administered.  Repeat shots were obtained as well. Following this, a view of the left coronary system was obtained in multiple angiographic projections.  We then completed the procedure and I discussed the findings with my colleagues.  The patient has a diffuse area of narrowing in the proximal to mid right coronary artery.  There are few other signs of atherosclerotic disease.  Based upon the clinical history, my concern was that the patient might have a spontaneous coronary dissection with intramural hematoma.  As a result, I elected to consider intravascular ultrasound.  I  discussed this with the patient and subsequently with the husband.  A 5-French sheath was exchanged for a 6-French sheath.  Following this, ACT was checked and found to be appropriate.  We then entered the right coronary artery using a JR-4 guiding catheter with side holes.  A traverse wire was placed down in the distal vessel.  Intravascular ultrasound was performed.  This demonstrated a normal large caliber distal vessel of 4 to 4.5 mm in diameter.  In the area of question, there is an extensive hematoma that extends throughout the entire area of narrowing.  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 Intravascular ultrasound catheter was then subsequently removed.  I called Dr. Kathlee Nations Trigt to the catheterization laboratory and he and Dr. Clifton Jackson and I reviewed the findings.  The patient has an intact distal lumen.  It is widely patent.  She has evidence of intravascular hematoma.  It is not extended to the distal vessel yet.  Options of percutaneous intervention versus revascularization surgery were then reviewed.  After thorough discussion, we felt that urgent revascularization surgery would be the best mechanism for protection of the distal integrity of the right coronary artery.  This was explained to the patient and her husband and they were agreeable to proceed.  She is being taken to the operating room by Dr. [name].  HEMODYNAMIC DATA: 1. Central aortic pressure is 118/86,  mean 102. 2. Left ventricular pressure 113/9. 3. There was no gradient or pullback across aortic valve.  ANGIOGRAPHIC DATA: 1. Ventriculography in the RAO projection reveals an inferobasal area     of moderately severe hypokinesis.  The rest of the left ventricle     appears to move.  EF would be in the range of 50%. 2. Aortic root does not appear to demonstrate dissection.  There is no     significant aortic regurgitation.  We were not able to  identify     significant contrast hang up at the coronaries with the aortic root     injection. 3. The left main is free of disease. 4. The LAD courses to the apex.  There are 2 major diagonal branches.     There may be about 20% narrowing in the distal LAD but it does not     appear to be highly compromised and blood flow is relatively good     distally.  The diagonals are without critical narrowing. 5. The circumflex is a large-caliber vessel providing predominantly a     large marginal branch free of critical disease. 6. The right coronary artery demonstrates segmental narrowing of about     40-50% throughout the proximal and mid vessel and then a focal area     of about 70%.  The vessel then opens up and provides a posterior     descending and very large posterolateral system with multiple     branches and a large vascular distribution. 7. Intravascular ultrasound demonstrates evidence of a fairly normal     distal vessel with lumens of up to almost 5 mm.  Extending from the     midportion of the right coronary artery all the way close towards     the ostium is evidence of a large volume hematoma with extrinsic     compression of the lumen.  This is compatible with a intramural     hematoma.  CONCLUSION: 1. Non-ST-elevation myocardial infarction. 2. Narrowing of the right coronary artery with intramural hematoma     noted by intracoronary ultrasound compatible with spontaneous     coronary dissection in a 40 year old female runner.  DISPOSITION:  The patient presented with chest pain and positive enzymes.  She has a wall motion abnormality.  An ultrasound evidence of a large intramural hematoma of the right coronary artery.  The integrity of the distal vessel is intact by intravascular ultrasound. Importantly, options at this point would include awaiting or establishment of the integrity of the distal lumen.  Given the historical natural course of spontaneous coronary  dissection, it was felt that establishment of the distal luminal integrity would be the best option.  After discussion with Dr. [name], given the size of the right coronary artery it was felt that approaching this with urgent revascularization surgery would provide the likely best optimal outcome at the present time.  This will proceed promptly.     Arturo Morton. Riley Kill, MD, [facility]     TDS/MEDQ  D:  09/12/2010  T:  09/12/2010  Job:  [id number]  cc:   Bevelyn Buckles. Bensimhon, MD CV Laboratory  Electronically Signed by Shawnie Pons MD [facility] on 09/25/2010 06:09:26 PM

## 2010-09-30 ENCOUNTER — Encounter: Payer: Self-pay | Admitting: Cardiology

## 2010-09-30 ENCOUNTER — Encounter: Payer: Self-pay | Admitting: *Deleted

## 2010-10-01 ENCOUNTER — Ambulatory Visit (INDEPENDENT_AMBULATORY_CARE_PROVIDER_SITE_OTHER): Payer: BC Managed Care – PPO | Admitting: Cardiology

## 2010-10-01 ENCOUNTER — Encounter: Payer: Self-pay | Admitting: Cardiology

## 2010-10-01 DIAGNOSIS — I2510 Atherosclerotic heart disease of native coronary artery without angina pectoris: Secondary | ICD-10-CM

## 2010-10-01 DIAGNOSIS — E7800 Pure hypercholesterolemia, unspecified: Secondary | ICD-10-CM

## 2010-10-01 NOTE — Patient Instructions (Addendum)
Your physician recommends that you return for a FASTING lipid and liver profile (414.01, 272.0), nothing to eat or drink after midnight--lab opens at 8:30.   Your physician has requested that you have an exercise tolerance test in 4-6 WEEKS. For further information please visit [email or web address] Please also follow instruction sheet, as given.  Your physician recommends that you continue on your current medications as directed. Please refer to the Current Medication list given to you today.  [facility] IS NOT APPROVED BY YOUR INSURANCE, ONCE YOU HAVE LABS DRAWN WE WILL START A GENERIC STATIN.

## 2010-10-01 NOTE — Progress Notes (Signed)
HPI:  Doing well post bypass.  Presented with [facility], and underwent CABG.  Walking daily and doing well.   No chest pain.  Had CP while running, pos enzymes, then evidence of RCA hematoma with vessel compromise-----leading to urgent CABG.  IVUS done reveals large RCA hematoma.    Current Outpatient Prescriptions  Medication Sig Dispense Refill  . aspirin 325 MG tablet Take 325 mg by mouth daily.        . diphenhydrAMINE (BENADRYL) 25 mg capsule Take 25 mg by mouth at bedtime as needed.        . diphenhydrAMINE (SOMINEX) 25 MG tablet Take 25 mg by mouth at bedtime as needed.        Marland Kitchen levonorgestrel (MIRENA) 20 MCG/24HR IUD 1 each by Intrauterine route once.        Marland Kitchen levothyroxine (SYNTHROID, LEVOTHROID) 150 MCG tablet Take 150 mcg by mouth daily.        . metoprolol tartrate (LOPRESSOR) 25 MG tablet Take 1/2 tablet twice a day       . omeprazole (PRILOSEC OTC) 20 MG tablet Take 20 mg by mouth daily.          No Known Allergies  Past Medical History  Diagnosis Date  . Hypothyroid   . Overweight     Past Surgical History  Procedure Date  . Thyroidectomy     Family History  Problem Relation Age of Onset  . Hypertension Mother   . Hypertension Father   . Hypertension Sister   . Coronary artery disease      grandfather    History   Social History  . Marital Status: Married    Spouse Name: N/A    Number of Children: N/A  . Years of Education: N/A   Occupational History  . Not on file.   Social History Main Topics  . Smoking status: Never Smoker   . Smokeless tobacco: Not on file  . Alcohol Use: No  . Drug Use: No  . Sexually Active: Yes    Birth Control/ Protection: IUD   Other Topics Concern  . Not on file   Social History Narrative  . No narrative on file    ROS: Please see the HPI.  All other systems reviewed and negative.  PHYSICAL EXAM:  BP 104/80  Pulse 71  Resp 18  Ht 5\' 8"  (1.727 m)  Wt 215 lb 12.8 oz (97.886 kg)  BMI 32.81 kg/m2  General: Well  developed, well nourished, in no acute distress. Head:  Normocephalic and atraumatic. Neck: no JVD Lungs: Clear to auscultation and percussion. Median sternotomy healing well.  Heart: Normal S1 and S2.  No murmur, rubs or gallops.  Abdomen:  Normal bowel sounds; soft; non tender; no organomegaly Pulses: Pulses normal in all 4 extremities. Extremities: No clubbing or cyanosis. No edema. Neurologic: Alert and oriented x 3.  EKG:  NSR.Marland Kitchen Delay in R wave progression.  No acute changes.  CARDIAC CATH:  September 12, 2010  ANGIOGRAPHIC DATA:   1. Ventriculography in the RAO projection reveals an inferobasal area       of moderately severe hypokinesis.  The rest of the left ventricle       appears to move.  EF would be in the range of 50%.   2. Aortic root does not appear to demonstrate dissection.  There is no       significant aortic regurgitation.  We were not able to identify       significant  contrast hang up at the coronaries with the aortic root       injection.   3. The left main is free of disease.   4. The LAD courses to the apex.  There are 2 major diagonal branches.       There may be about 20% narrowing in the distal LAD but it does not       appear to be highly compromised and blood flow is relatively good       distally.  The diagonals are without critical narrowing.   5. The circumflex is a large-caliber vessel providing predominantly a       large marginal branch free of critical disease.   6. The right coronary artery demonstrates segmental narrowing of about       40-50% throughout the proximal and mid vessel and then a focal area       of about 70%.  The vessel then opens up and provides a posterior       descending and very large posterolateral system with multiple       branches and a large vascular distribution.   7. Intravascular ultrasound demonstrates evidence of a fairly normal       distal vessel with lumens of up to almost 5 mm.  Extending from the        midportion of the right coronary artery all the way close towards       the ostium is evidence of a large volume hematoma with extrinsic       compression of the lumen.  This is compatible with a intramural       hematoma.      CONCLUSION:   1. Non-ST-elevation myocardial infarction.   2. Narrowing of the right coronary artery with intramural hematoma       noted by intracoronary ultrasound compatible with spontaneous       coronary dissection in a 40 year old female runner.      DISPOSITION:  The patient presented with chest pain and positive   enzymes.  She has a wall motion abnormality.  An ultrasound evidence of   a large intramural hematoma of the right coronary artery.  The integrity   of the distal vessel is intact by intravascular ultrasound.   Importantly, options at this point would include awaiting or   establishment of the integrity of the distal lumen.  Given the   historical natural course of spontaneous coronary dissection, it was   felt that establishment of the distal luminal integrity would be the   best option.  After discussion with Dr. [name], given the size of the   right coronary artery it was felt that approaching this with urgent   revascularization surgery would provide the likely best optimal outcome   at the present time.  This will proceed promptly.          ASSESSMENT AND PLAN:

## 2010-10-02 NOTE — Consult Note (Signed)
NAMEDAVINITY, FANARA NO.:  [id number]  MEDICAL RECORD NO.:  [id number]  LOCATION:  2905                         FACILITY:  [facility]  PHYSICIAN:  Bevelyn Buckles. Aowyn Rozeboom, MDDATE OF BIRTH:  Dec 09, 1970  DATE OF CONSULTATION:  09/12/2010 DATE OF DISCHARGE:                                CONSULTATION   REQUESTING PHYSICIAN:  Richarda Overlie, MD of the [facility].  PRIMARY CARE PHYSICIAN:  Dr. Hyacinth Meeker.  REASON FOR CONSULTATION:  Non-ST elevation myocardial infarction.  HISTORY OF PRESENT ILLNESS:  [name] is a delightful 40 year old woman with a history of overweight, low HDL, and thyroid cancer status post I- 131 ablation about 10 years ago.  She denies any cardiac history.  She does not have any history of diabetes or hypertension.  About 5 weeks ago, she joined a beginning running club at [employer] where she works.  She had been doing quite well with this.  Yesterday after her lunch time run, she developed chest pain, diaphoresis, and presyncope.  This persisted for quite some time.  She went back to her desk and put her head down in her desk and her boss asked her what was wrong.  At that point, they called 911.  She was apparently taken to the [facility] and initial EKG had some question of mild inferior ST-elevation.  D-dimer was negative.  White count was mildly elevated at 16.2.  First set of cardiac markers were normal.  However, second set of cardiac markers came back with a troponin of 13.56 and a CK-MB of 41.1.  She was transferred to [facility] and accepted by the [facility].  They contacted me early this morning.  She was started on heparin and aspirin and treated with beta-blockers.  She is now pain free.  Overnight, she did have a 14-beat run of nonsustained VT.  REVIEW OF SYSTEMS:  She denies any fevers or chills.  No nausea or vomiting.  She has not had any heart failure symptoms.  No claudication. She has not  had any bleeding.  She has an IUD and does not believe that she is pregnant.  Remainder of review of systems, all systems are negative except for HPI.  PROBLEM LIST: 1. Overweight. 2. Low HDL. 3. Thyroid cancer status post I-131 ablation approximately 10 years     ago.  HOME MEDICATIONS:  None.  ALLERGIES:  None.  SOCIAL HISTORY:  She is married.  She works as an Arts administrator at [employer].  She denies tobacco use, just social alcohol.  FAMILY HISTORY:  Both her mother, father, and sister have hypertension. There is a grandfather with coronary artery disease.  PHYSICAL EXAMINATION:  GENERAL:  She is sitting in bed in no acute distress.  She is somewhat nervous. VITAL SIGNS:  Blood pressure 128/80 with a heart rate of 94.  She is saturating 98% on 2 L nasal cannula. HEENT:  Normal. NECK:  Supple.  No JVD.  Carotids are 2+ bilaterally without bruits. There is no lymphadenopathy or thyromegaly. CARDIAC:  PMI is nondisplaced.  Regular rate and rhythm.  No murmurs, rubs, or gallops. LUNGS:  Clear. ABDOMEN:  Obese, nontender, nondistended.  No hepatosplenomegaly, no  bruits.  No masses.  Good bowel sounds. EXTREMITIES:  Warm with no cyanosis, clubbing, or edema.  There are strong distal pulses throughout. NEURO:  Alert and oriented x3.  Cranial nerves II through XII are intact.  Moves all 4 extremities without difficulty.  Affect is pleasant.  LABORATORY DATA:  Sodium 134, potassium 3.4, chloride 106, bicarb 21, BUN of 14, creatinine of 1.  White count 16.2, hemoglobin 13.3, platelets 321.  Urinalysis is negative.  Initial troponin is less than 0.3.  Followup troponin was 13.56.  Initial CK and MB were normal. Followup CK and MB of 41.1.  D-dimer was negative.  EKG shows sinus rhythm with heart rate of 79.  There is a first-degree AV block at 208 milliseconds.  There is now small Q-waves in III and aVF, which were not there previously.  ASSESSMENT: 1. Non-ST elevation myocardial  infarction. 2. Nonsustained ventricular tachycardia. 3. History of thyroid cancer status post I-131 ablation. 4. Low HDL.  PLAN/DISCUSSION:  She will need cardiac catheterization this morning.  I have discussed this with her including the risks.  She will be treated with heparin, beta-blockers, aspirin, and statin.  We will transfer her to our service.  I did discuss the possibility with her of possible spontaneous coronary dissection; however, I think this is likely a ruptured plaque physiology.     Bevelyn Buckles. Cleotilde Spadaccini, MD     DRB/MEDQ  D:  09/12/2010  T:  09/12/2010  Job:  [id number]  Electronically Signed by Arvilla Meres MD on 10/02/2010 10:39:04 PM

## 2010-10-04 NOTE — Discharge Summary (Signed)
[name], [name].:  [id number]  MEDICAL RECORD NO.:  [id number]  LOCATION:  2006                         FACILITY:  [facility]  PHYSICIAN:  Kerin Perna, M.D.  DATE OF BIRTH:  1970-10-16  DATE OF ADMISSION:  09/11/2010 DATE OF DISCHARGE:  09/16/2010                              DISCHARGE SUMMARY   HISTORY:  The patient is a 40 year old female who works in the Music therapist at [employer].  Approximately 5 weeks ago, she began an exercise program specifically running where she works.  She reported that she had been doing quite well with this.  On the date of presentation, she had an episode where she developed chest pain, diaphoresis and presyncope. These symptoms persisted for quite sometime.  Her boss inquired what was wrong with her and then they called 911.  She was initially taken to the [facility] where initial EKG had some questionable findings of inferior ST-segment elevation.  D-dimer was negative.  White cell count was elevated at 16.2.  The first set of cardiac enzymes were negative, however, the second set did prove to be positive with a troponin of 13.5 and a CK-MB of 41.1.  She was then transferred to [facility] and accepted by the [facility] for further evaluation and treatment to include Cardiology evaluation.  She was started on heparin and aspirin as well as beta blocker.  REVIEW OF SYMPTOMS:  She denied fevers or chills.  She had no nausea or vomiting.  She has not had any heart failure symptoms.  She denied claudication.  She has not had any bleeding.  She does have an IUD. Otherwise, the review of symptoms was unremarkable with the exception of the HPI.  MEDICAL HISTORY: 1. Overweight. 2. Low HDL level. 3. History of thyroid cancer.  HOME MEDICATIONS: 1. Levothyroxine 150 mcg daily. 2. Mirena intrauterine q. 5 years. 3. Omeprazole 20 mg daily. 4. Diphenhydramine 25 mg p.r.n. as needed for  sleep.  ALLERGIES:  None.  SOCIAL HISTORY:  She is married.  She works as an Arts administrator at [employer].  She denies tobacco use, alcohol use is described as social.  FAMILY HISTORY:  Both her mother, father and sister have hypertension. She also has a grandfather with a history of coronary artery disease.  PHYSICAL EXAMINATION:  Please see the history and physical.  HOSPITAL COURSE:  The patient ruled in for non-ST-segment elevation myocardial infarction.  She was taken to cardiac catheterization by Dr. Riley Kill.  She was found to have findings consistent with a dissection of the right coronary artery that was felt to be spontaneous with evidence of hematoma.  Due to this finding, urgent surgical consultation was obtained with Kerin Perna, MD who evaluated the patient and studies and agreed with recommendations to proceed with urgent surgical revascularization procedure.  On September 13, 2010, she underwent emergency coronary artery bypass grafting x1 with a saphenous vein graft to the distal right coronary artery.  She tolerated the procedure well and was taken to the surgical intensive care unit in stable condition.  POSTOPERATIVE HOSPITAL COURSE:  She has progressed quite nicely.  She has maintained stable hemodynamics.  She had been  neurologically intact. She was weaned from the ventilator without difficulty.  All routine lines, monitors, drainage devices have been discontinued in the standard fashion.  She does have a mild acute blood loss anemia.  Her values have stabilized.  Her most recent hemoglobin/hematocrit dated September 15, 2010, are 9.4 and 28.2 respectively.  Her electrolytes, BUN and creatinine are within normal limits.  She has had no significant cardiac dysrhythmias.  She has been weaned from oxygen and maintains good saturations on room air.  She is tolerating routine advancement and activities using standard protocols.  Her incisions are healing well without  evidence of infection.  Her overall status is felt to be stable for discharge on today's date, September 16, 2010.  MEDICATIONS AT DISCHARGE: 1. Aspirin 325 mg daily. 2. Guaifenesin p.r.n. 3. Metoprolol 12.5 mg twice daily. 4. Oxycodone 5-10 mg every 4-6 hours p.r.n. for pain. 5. Crestor 20 mg daily. 6. Diphenhydramine p.r.n. for sleep 25 mg. 7. Levothyroxine 150 mcg daily. 8. Mirena as used previously 20 mcg per 24-hour intrauterine device     every 5 years. 9. Omeprazole 20 mg daily. 10.Note, she was Topamax 25 mg 3 tablets at bedtime, but this has not     been restarted yet.  INSTRUCTIONS:  The patient received written instructions in regards to medications, activity, diet, wound care and followup.  Follow up include Dr. Prescott Gum office 2 weeks post discharge, Dr. Zenaida Niece Trigt's office 3 weeks post discharge.  FINAL DIAGNOSIS:  Spontaneous right coronary artery dissection, now status post surgical revascularization as described.  Other diagnoses include thyroid cancer, hypothyroidism, low HDL, dyslipidemia, acute blood loss anemia.     Rowe Clack, P.A.-C.   ______________________________ Kerin Perna, M.D.    Sherryll Burger  D:  09/16/2010  T:  09/16/2010  Job:  [id number]  cc:   Bevelyn Buckles. Bensimhon, MD  Electronically Signed by Deniece Portela GOLD P.A.-C. on 09/19/2010 12:38:13 PM Electronically Signed by Kerin Perna M.D. on 10/04/2010 01:22:32 PM

## 2010-10-04 NOTE — Consult Note (Signed)
[name], [name] NO.:  [id number]  MEDICAL RECORD NO.:  [id number]  LOCATION:  2399                         FACILITY:  [facility]  PHYSICIAN:  Kerin Perna, M.D.  DATE OF BIRTH:  13-Sep-1970  DATE OF CONSULTATION:  09/12/2010 DATE OF DISCHARGE:                                CONSULTATION   REASON FOR CONSULTATION:  Right coronary artery dissection with acute inferior MI.  CHIEF COMPLAINT:  Chest pain.  PHYSICIAN REQUESTING CONSULTATION:  Arturo Morton. Riley Kill, MD, [facility]  PRIMARY CARE PHYSICIAN:  Dr. Hyacinth Meeker  HISTORY OF PRESENT ILLNESS:  I was asked to evaluate this 40 year old Caucasian female nonsmoker for emergency coronary bypass grafting.  She presented to the hospital system with chest pain and an EKG that showed ST-segment changes in the inferior leads.  Her cardiac enzymes were elevated with a troponin of 13 and she was scheduled for urgent cardiac catheterization by Dr. Riley Kill.  Dr. Riley Kill demonstrated evidence of a right coronary intramural hematoma from probable vessel spontaneous dissection with compression of the lumen.  The vessel did not appear to have significant atherosclerotic changes.  The LAD and circumflex systems were free of significant disease.  The ventriculogram showed some inferior wall hypokinesia.  The patient did have few transient episodes of ventricular tachycardia prior to the cath.  She is now hemodynamically stable in the cath lab for the surgical consultation.  PAST MEDICAL HISTORY: 1. Status post total thyroidectomy at [facility] for     thyroid cancer in approximately 10 years ago. 2. Low HDL dyslipidemia.  No known drug allergies.  HOME MEDICATIONS:  None.  SOCIAL HISTORY:  The patient is married with children.  Works in the Firefighter at [employer].  She does not smoke and she uses alcohol occasionally.  REVIEW OF SYSTEMS:  CONSTITUTIONAL:  Negative for fever, weight  loss. She has started to run to lose weight.  She is in a running club at [employer].  ENT:  Negative for dental problems or difficulty swallowing.  THORACIC:  Negative for history of abnormal chest x-ray or chest trauma.  CARDIAC:  Positive for acute coronary artery disease. Negative for previous arrhythmia, murmur, or family history of coronary artery disease.  There is a family history of hypertension.  GI: Negative for GERD, hepatitis, jaundice, or blood per rectum.  UROLOGIC: Negative for kidney stones.  ENDOCRINE:  Positive for thyroid disease status post surgery and I-131 ablation 10 years ago.  She had no problems with the thyroid surgery or operation recovery.  She did have some nausea from the anesthetic agents.  VASCULAR:  Negative for DVT, bleeding disorder, or clotting disorder.  NEUROLOGIC:  Negative for stroke or seizure.  PHYSICAL EXAMINATION:  GENERAL:  The patient is an alert young female on the cath lab table in no acute distress. VITAL SIGNS:  Blood pressure is 129/70, pulse is 90 sinus, saturations 95% to 99% on 2 L. ENT:  Normocephalic.  Dentition good. NECK:  Without JVD or mass. LYMPHATICS:  No palpable adenopathy.  She has a well-healed thyroid collar incision. CARDIAC:  Regular rhythm without murmur or gallop. ABDOMEN:  Soft, nontender. LUNGS:  Clear without deformity of  the thorax. EXTREMITIES:  Good perfusion.  No clubbing, cyanosis, tenderness, or edema.  Peripheral pulses are all intact in the extremities. NEUROLOGIC:  Alert and oriented without focal motor deficit.  LABORATORY DATA:  Her troponin this morning is 13, MB of 41.1 ng/mL. White count 16,000, hemoglobin 13, creatinine 1.0.  I reviewed the results of the cardiac cath with Dr. Riley Kill.  The I shows a clear intramural hematoma in the main right coronary.  The aortogram shows no evidence of dissection of the ascending aorta or aortic root.  IMPRESSION AND PLAN:  The patient will be prepared  for urgent surgical coronary revascularization of the right coronary.  I have discussed the indications, benefits, and risk of the surgery with the patient.  She understands and agrees to proceed.     Kerin Perna, M.D.     PV/MEDQ  D:  09/12/2010  T:  09/12/2010  Job:  [id number]  cc:   Arturo Morton. Riley Kill, MD, [facility] Bevelyn Buckles. Bensimhon, MD Dr. Hyacinth Meeker  Electronically Signed by Kerin Perna M.D. on 10/04/2010 01:22:41 PM

## 2010-10-04 NOTE — Op Note (Signed)
[name], [name] NO.:  [id number]  MEDICAL RECORD NO.:  [id number]  LOCATION:  2311                         FACILITY:  [facility]  PHYSICIAN:  Kerin Perna, M.D.  DATE OF BIRTH:  1970/09/22  DATE OF PROCEDURE:  09/13/2010 DATE OF DISCHARGE:                              OPERATIVE REPORT   OPERATIONS: 1. Emergency coronary artery bypass grafting x1 for spontaneous     coronary artery dissection (endoscopically harvested saphenous vein     graft to the distal right coronary artery). 2. Endoscopic harvest of right greater saphenous vein.  SURGEON:  Kerin Perna, MD  ASSISTANT:  Rowe Clack, PA-C  ANESTHESIA:  General.  INDICATIONS:  The patient is a 40 year old Caucasian female who developed acute chest pain after running for exercise.  She presented to Urgent Care were her cardiac enzymes were mildly elevated and had ST- segment changes.  Urgent cardiac catheterization by Dr. Riley Kill demonstrated evidence of an intramural hematoma surrounding the midportion of the right coronary artery with compression of the lumen. The other coronary arteries appeared to be normal.  There was no sign of atherosclerotic disease.  There was no sign of aortic dissection or aneurysm.  The patient was felt to be candidate for emergency surgical revascularization and I examined the patient in the Cath Lab and agreed that the surgery was her best option.  I discussed the situation in the Cath Lab with the patient as well as in the Cath Lab waiting area with the patient's husband and family.  I reviewed the indications, benefits and risks of emergency coronary artery bypass surgery for treatment of acute coronary artery dissection.  I reviewed the alternatives to surgical therapy as well.  The patient understood the risks of the operation to include MI, stroke, bleeding, infection, recurrent dissection, and death.  After reviewing these issues, she demonstrated her  understanding and agreed to proceed with surgery under what I felt was an informed consent.  OPERATIVE FINDINGS: 1. Good-quality conduit. 2. Minimal perivascular changes around the area of the hematoma noted     from the outside of the epicardium. 3. No blood products required.  OPERATION:  The patient was brought directly from the Cath Lab to the operating room where general anesthesia was induced.  The chest, abdomen and legs were prepped and draped as a sterile field.  A transesophageal 2-D echoprobe had been placed by the anesthesiologist.  A sternotomy was performed as the saphenous vein was harvested endoscopically from the right leg.  The pericardium was opened and suspended.  There was no significant pericardial effusion or blood in the pericardium. Pursestrings were placed in the ascending aorta and right atrium.  After the vein had been harvested, heparin was administered and ACT was documented as being therapeutic for bypass.  The patient was cannulated and placed on cardiopulmonary bypass.  The coronary arteries were carefully inspected.  There was no significant abnormality in order to the right coronary artery.  However, the area of concern was located and some deep epicardial fat.  I did not disturb this or dissect into that area.  The vessel just before the bifurcation at the posterior descending appeared to be good  target and this was dissected out. Cardioplegic catheters were placed for both antegrade and retrograde cold blood cardioplegia and the patient was cooled to 32 degrees.  The crossclamp was then applied.  An 800 mL of cold blood cardioplegia was delivered in split doses between the antegrade and retrograde cardioplegia lines.  There was a good cardioplegic arrest.  The distal coronary anastomosis was then placed at the distal RCA just before the PD bifurcation.  It was 1.6 to 1.7-mm vessel with normal intima and a normal arterial wall.  A reverse  saphenous vein was sewn end-to-side with running 7-0 Prolene.  There was excellent flow through graft.  The proximal anastomosis was then performed after the vein was trimmed the appropriate length and orientation using a 4.5-mm punch on the ascending aorta with a running 7-0 Prolene.  Prior to removing the crossclamp, the air was vented from the coronary arteries with a dose of retrograde warm blood cardioplegia.  The crossclamp was removed.  The heart was resumed a spontaneous rhythm.  The graft had excellent flow and hemostasis was documented at the proximal and distal anastomoses.  The patient was rewarmed and reperfused and pacing wires were applied.  The lungs were expanded and the ventilator was resumed. The patient was weaned from bypass without difficulty.  Cardiac output and blood pressure were stable without inotropes.  Protamine was administered without adverse reaction.  The superior pericardial fat was closed over the aorta.  A mediastinal chest tube was placed and brought out through separate incision.  The sternum was closed with interrupted steel wire.  The pectoralis fascia was closed with a running #1 Vicryl. Subcutaneous and skin layers were closed with a running Vicryl and sterile dressings were applied.  Total cardiopulmonary bypass time was 41 minutes.     Kerin Perna, M.D.     PV/MEDQ  D:  09/13/2010  T:  09/14/2010  Job:  [id number]  cc:   Arturo Morton. Riley Kill, MD, [facility]. Bensimhon, MD  Electronically Signed by Kerin Perna M.D. on 10/04/2010 01:22:44 PM

## 2010-10-07 ENCOUNTER — Other Ambulatory Visit: Payer: Self-pay | Admitting: Cardiothoracic Surgery

## 2010-10-07 DIAGNOSIS — I2510 Atherosclerotic heart disease of native coronary artery without angina pectoris: Secondary | ICD-10-CM

## 2010-10-08 DIAGNOSIS — E039 Hypothyroidism, unspecified: Secondary | ICD-10-CM

## 2010-10-08 DIAGNOSIS — Z85850 Personal history of malignant neoplasm of thyroid: Secondary | ICD-10-CM | POA: Insufficient documentation

## 2010-10-08 DIAGNOSIS — E663 Overweight: Secondary | ICD-10-CM | POA: Insufficient documentation

## 2010-10-08 DIAGNOSIS — I2510 Atherosclerotic heart disease of native coronary artery without angina pectoris: Secondary | ICD-10-CM | POA: Insufficient documentation

## 2010-10-08 NOTE — Assessment & Plan Note (Signed)
Presented with SCAD involving the RCA with IVUS evidence of RCA intramural hematoma.  Underwent CABG with PVT.  Doing well.  Continue will current progress.

## 2010-10-09 ENCOUNTER — Ambulatory Visit (INDEPENDENT_AMBULATORY_CARE_PROVIDER_SITE_OTHER): Payer: Self-pay | Admitting: Cardiothoracic Surgery

## 2010-10-09 ENCOUNTER — Encounter: Payer: Self-pay | Admitting: Cardiothoracic Surgery

## 2010-10-09 ENCOUNTER — Other Ambulatory Visit (INDEPENDENT_AMBULATORY_CARE_PROVIDER_SITE_OTHER): Payer: BC Managed Care – PPO | Admitting: *Deleted

## 2010-10-09 ENCOUNTER — Ambulatory Visit
Admission: RE | Admit: 2010-10-09 | Discharge: 2010-10-09 | Disposition: A | Discharge status: Home / Self Care | Payer: BC Managed Care – PPO | Source: Ambulatory Visit | Attending: Cardiothoracic Surgery | Admitting: Cardiothoracic Surgery

## 2010-10-09 VITALS — BP 108/76 | HR 72 | Resp 18 | Ht 68.0 in | Wt 215.0 lb

## 2010-10-09 DIAGNOSIS — I2510 Atherosclerotic heart disease of native coronary artery without angina pectoris: Secondary | ICD-10-CM

## 2010-10-09 DIAGNOSIS — E7800 Pure hypercholesterolemia, unspecified: Secondary | ICD-10-CM

## 2010-10-09 DIAGNOSIS — Z951 Presence of aortocoronary bypass graft: Secondary | ICD-10-CM

## 2010-10-09 DIAGNOSIS — I2542 Coronary artery dissection: Secondary | ICD-10-CM

## 2010-10-09 LAB — HEPATIC FUNCTION PANEL
ALT: 197 U/L — ABNORMAL HIGH (ref 0–35)
AST: 158 U/L — ABNORMAL HIGH (ref 0–37)
Alkaline Phosphatase: 290 U/L — ABNORMAL HIGH (ref 39–117)
Bilirubin, Direct: 0.2 mg/dL (ref 0.0–0.3)
Total Bilirubin: 1 mg/dL (ref 0.3–1.2)

## 2010-10-09 LAB — LIPID PANEL
Cholesterol: 169 mg/dL (ref 0–200)
VLDL: 29 mg/dL (ref 0.0–40.0)

## 2010-10-09 NOTE — Progress Notes (Signed)
HPI  The patient returns 4 weeks after emergency coronary artery bypass surgery for spontaneous right coronary dissection. She had a vein graft placed to the distal RCA and did well following surgery. She had no atherosclerotic changes of her coronary vessels. She had no significant myocardial injury. She is doing well at home and is walking daily. She has no recurrent angina and the incisions are healing well.    Current Outpatient Prescriptions  Medication Sig Dispense Refill  . aspirin 81 MG tablet Take 81 mg by mouth daily.        . diphenhydrAMINE (BENADRYL) 25 mg capsule Take 25 mg by mouth at bedtime as needed.        Marland Kitchen levonorgestrel (MIRENA) 20 MCG/24HR IUD 1 each by Intrauterine route once.        Marland Kitchen levothyroxine (SYNTHROID, LEVOTHROID) 150 MCG tablet Take 150 mcg by mouth daily.        . metoprolol tartrate (LOPRESSOR) 25 MG tablet Take 1/2 tablet twice a day       . omeprazole (PRILOSEC OTC) 20 MG tablet Take 20 mg by mouth daily.        Marland Kitchen oxycodone (OXY-IR) 5 MG capsule Take 5 mg by mouth every 4 (four) hours as needed.           Review of Systems: No fever. Good appetite. Some problems with insomnia. No unusual incisional pain.   Physical Exam Blood pressure 108/74 pulse 72 and regular saturation room air 98% she is alert and pleasant. Sounds are clear and equal. The sternum is well-healed. Cardiac rhythm is regular without gallop or rub. The right leg vein harvest incision is healed and there is no significant pedal edema.  Diagnostic Tests: A chest x-ray performed today shows clear lung fields, no pleural effusion, sternal wires intact.  Impression: Excellent progress early postop period she will resume driving, light activities, and is scheduled for a stress test with Dr. Tedra Senegal later this month. I will have her return in 4-5 weeks to review her status regarding return to work.

## 2010-10-09 NOTE — Patient Instructions (Signed)
Continue a daily walk or two 25 minutes, resume driving, do not lift more than 10lbs

## 2010-10-10 ENCOUNTER — Telehealth: Payer: Self-pay

## 2010-10-10 ENCOUNTER — Encounter: Payer: Self-pay | Admitting: Gastroenterology

## 2010-10-10 DIAGNOSIS — R748 Abnormal levels of other serum enzymes: Secondary | ICD-10-CM

## 2010-10-10 NOTE — Telephone Encounter (Signed)
I spoke with the pt and made her aware of lab results.  The pt is also aware of abdominal ultrasound and repeat labs.  I will continue to work on GI appt.

## 2010-10-10 NOTE — Telephone Encounter (Signed)
I spoke with GI and she is scheduled on Monday 10/14/10 at 2:00 with Dr Russella Dar.  I made the [name] aware of this appointment.

## 2010-10-10 NOTE — Telephone Encounter (Signed)
Pt rtn call, pls call  °

## 2010-10-10 NOTE — Telephone Encounter (Signed)
Tell patient to not take a statin specifically. Have her get an ultrasound of the liver, and arrange an appointment to see GI medicine as quickly as possible.  Recheck her TSH, and liver profile repeat. TS  I left a message on the pt's home number and cell phone to call the office. The pt is scheduled for an Abdominal Ultrasound on 10/11/10 at 8:30.  The pt should arrive at 8:15 and NPO after midnight.  I called GI but there power is currently out. Order placed for lab work to be repeated on 10/11/10.

## 2010-10-11 ENCOUNTER — Ambulatory Visit (INDEPENDENT_AMBULATORY_CARE_PROVIDER_SITE_OTHER): Payer: BC Managed Care – PPO | Admitting: *Deleted

## 2010-10-11 ENCOUNTER — Ambulatory Visit (HOSPITAL_COMMUNITY)
Admission: RE | Admit: 2010-10-11 | Discharge: 2010-10-11 | Disposition: A | Discharge status: Home / Self Care | Payer: BC Managed Care – PPO | Source: Ambulatory Visit | Attending: Cardiology | Admitting: Cardiology

## 2010-10-11 DIAGNOSIS — R748 Abnormal levels of other serum enzymes: Secondary | ICD-10-CM

## 2010-10-11 DIAGNOSIS — R945 Abnormal results of liver function studies: Secondary | ICD-10-CM | POA: Insufficient documentation

## 2010-10-11 DIAGNOSIS — E039 Hypothyroidism, unspecified: Secondary | ICD-10-CM

## 2010-10-11 DIAGNOSIS — Z951 Presence of aortocoronary bypass graft: Secondary | ICD-10-CM | POA: Insufficient documentation

## 2010-10-11 LAB — HEPATIC FUNCTION PANEL
ALT: 124 U/L — ABNORMAL HIGH (ref 0–35)
AST: 53 U/L — ABNORMAL HIGH (ref 0–37)
Alkaline Phosphatase: 262 U/L — ABNORMAL HIGH (ref 39–117)
Bilirubin, Direct: 0.2 mg/dL (ref 0.0–0.3)
Total Bilirubin: 1.6 mg/dL — ABNORMAL HIGH (ref 0.3–1.2)

## 2010-10-14 ENCOUNTER — Ambulatory Visit (INDEPENDENT_AMBULATORY_CARE_PROVIDER_SITE_OTHER): Payer: BC Managed Care – PPO | Admitting: Gastroenterology

## 2010-10-14 ENCOUNTER — Encounter: Payer: Self-pay | Admitting: Gastroenterology

## 2010-10-14 ENCOUNTER — Ambulatory Visit: Payer: BC Managed Care – PPO

## 2010-10-14 VITALS — BP 118/70 | HR 64 | Ht 68.0 in | Wt 218.8 lb

## 2010-10-14 DIAGNOSIS — R7401 Elevation of levels of liver transaminase levels: Secondary | ICD-10-CM

## 2010-10-14 DIAGNOSIS — I2510 Atherosclerotic heart disease of native coronary artery without angina pectoris: Secondary | ICD-10-CM

## 2010-10-14 DIAGNOSIS — E7800 Pure hypercholesterolemia, unspecified: Secondary | ICD-10-CM

## 2010-10-14 LAB — HEPATIC FUNCTION PANEL
ALT: 57 U/L — ABNORMAL HIGH (ref 0–35)
Alkaline Phosphatase: 192 U/L — ABNORMAL HIGH (ref 39–117)
Bilirubin, Direct: 0.1 mg/dL (ref 0.0–0.3)
Total Bilirubin: 1 mg/dL (ref 0.3–1.2)
Total Protein: 7.2 g/dL (ref 6.0–8.3)

## 2010-10-14 LAB — IBC PANEL
Saturation Ratios: 11.4 % — ABNORMAL LOW (ref 20.0–50.0)
Transferrin: 306.3 mg/dL (ref 212.0–360.0)

## 2010-10-14 NOTE — Progress Notes (Signed)
History of Present Illness: This is a 40 year old female referred for evaluation of elevated liver enzymes. Patient relates a history of elevated liver enzymes for several years. I do not have records available today. She states she has been tested for viral hepatitis and these tests have been negative. She denies alcohol use, blood transfusions, history of jaundice, history of exposure to hepatitis, family history of liver disease or intravenous drug usage. She was placed on a statin during recent hospitalization for urgent coronary bypass surgery but has not been on a statin since discharge. Her most recent liver function tests show an AST of 158, and ALT 197 an alkaline phosphatase of 290. Formal ultrasound performed on September 28 showed no abnormalities. She has no GI or liver-related complaints. Denies weight loss, abdominal pain, constipation, diarrhea, change in stool caliber, melena, hematochezia, nausea, vomiting, dysphagia, reflux symptoms, chest pain.  Review of Systems: Pertinent positive and negative review of systems were noted in the above HPI section. All other review of systems were otherwise negative.  Current Medications, Allergies, Past Medical History, Past Surgical History, Family History and Social History were reviewed in [employer] record.  Physical Exam: General: Well developed , well nourished, no acute distress Head: Normocephalic and atraumatic Eyes:  sclerae anicteric, EOMI Ears: Normal auditory acuity Mouth: No deformity or lesions Neck: Supple, no masses or thyromegaly Lungs: Clear throughout to auscultation Heart: Regular rate and rhythm; no murmurs, rubs or bruits Abdomen: Soft, non tender and non distended. No masses, hepatosplenomegaly or hernias noted. Normal Bowel sounds Musculoskeletal: Symmetrical with no gross deformities  Skin: No lesions on visible extremities Pulses:  Normal pulses noted Extremities: No clubbing, cyanosis, edema  or deformities noted Neurological: Alert oriented x 4, grossly nonfocal Cervical Nodes:  No significant cervical adenopathy Inguinal Nodes: No significant inguinal adenopathy Psychological:  Alert and cooperative. Normal mood and affect  Assessment and Recommendations:  1. Elevated liver enzymes including transaminases and alkaline phosphatase. Rule out medication side effects, chronic metabolic liver disease. No evidence for fatty infiltration on ultrasound but she has risk factors for fatty liver disease. Request prior liver tests records and any relevant hepatic serologies from Dr. Wynonia Lawman office. Consider liver biopsy. Repeat liver function test today and prothrombin time. Standard serologies for viral and metabolic liver diseases.

## 2010-10-14 NOTE — Patient Instructions (Addendum)
Go directly to the basement to have your labs drawn today. The Primary Care Physician we have in our building is Dr. Rene Paci and the phone number for her office is [phone]. We also have Dr. Loreen Freud at [facility] and their phone number is [phone]. The Endocrinologist we have within [place] is Dr. Everardo All who located also on the fist floor with Dr. Felicity Coyer if you would like to contact their office for an appointment.   cc: Shawnie Pons, MD

## 2010-10-15 LAB — ALPHA-1-ANTITRYPSIN: A-1 Antitrypsin, Ser: 82 mg/dL — ABNORMAL LOW (ref 90–200)

## 2010-10-15 LAB — CERULOPLASMIN: Ceruloplasmin: 29 mg/dL (ref 20–60)

## 2010-10-16 LAB — ANTI-SMOOTH MUSCLE ANTIBODY, IGG: Smooth Muscle Ab: 7 U (ref <20)

## 2010-10-17 NOTE — H&P (Signed)
[name], RAHIMI NO.:  [id number]  MEDICAL RECORD NO.:  [id number]  LOCATION:  4729                         FACILITY:  [facility]  PHYSICIAN:  Richarda Overlie, MD       DATE OF BIRTH:  01/28/1970  DATE OF ADMISSION:  09/11/2010 DATE OF DISCHARGE:                             HISTORY & PHYSICAL   PRIMARY CARE PHYSICIAN:  Dr. Hyacinth Meeker.  CHIEF COMPLAINT:  Chest pain.  SUBJECTIVE:  This is a 40 year old female who works at [employer].  She states that she was, otherwise, in good health up until this morning.  She recently started graded exercise program where she has been running long distances over the course of the last 1 month and has done quite well.  Today after she completed the run, she started experiencing some shortness of breath which was fairly acute in onset associated with diaphoresis and dizziness.  The patient denied any radiation of the pain.  The chest pain was located mostly in the left side of the chest, described as chest tightness, very different from her discomfort caused by her gastroesophageal reflux.  The patient also felt dizzy and lightheaded and her symptoms lasted for about 2 hours.  The patient has not had any recent history of travel.  She does not have any history of DVT or PE.  The patient, however, uses an IUD Mirena for the last several years.  The patient has not had any orthopnea, paroxysmal nocturnal dyspnea, dependent edema.  She denies any urinary urgency, frequency, or dysuria.  She is found to have an elevated white count of 16.2, but a negative infection workup in the ED.  She has also experienced some palpitations and near-syncopal episode today, but she did not pass out.  PAST MEDICAL HISTORY: 1. History of thyroid cancer. 2. Hypothyroidism.  PAST SURGICAL HISTORY:  Surgery for thyroid cancer.  ALLERGIES:  No known drug allergies.  SOCIAL HISTORY:  No history of smoking.  Drinks socially.  FAMILY  HISTORY:  Positive for hypertension in sister and the mother.  REVIEW OF SYSTEMS:  A 14-point review of systems was done as documented in HPI.  PHYSICAL EXAMINATION:  VITAL SIGNS:  Blood pressure 113/77, respirations 18, temperature 98.5, pulse 93-98% on room air. GENERAL:  Comfortable, currently in no acute cardiopulmonary distress. HEENT:  Pupils equal and reactive.  Extraocular movements intact. NECK:  Supple.  No JVD. LUNGS:  Clear to auscultation bilaterally.  No wheezes, crackles, or rhonchi. CARDIOVASCULAR:  Regular rate and rhythm.  No murmurs, rubs, or gallops. ABDOMEN:  Obese, soft, nontender, nondistended. EXTREMITIES:  Without cyanosis, clubbing, or edema. NEUROLOGIC:  Cranial nerves II through XII grossly intact.  LABORATORY DATA:  Urinalysis negative.  Troponin less than 0.3.  CK-MB is 3.8.  Relative index is mildly elevated at 2.9.  Sodium 134, potassium 3.4, chloride 106, bicarb 21, glucose 98, BUN 14, creatinine 1, calcium 8.7.  WBC 16.2, hemoglobin 13.3, hematocrit 37.7, platelet of 321.  EKG shows normal sinus rhythm, at a rate 59 beats per minute.  Chest x-ray shows no acute infiltrate and no acute cardiopulmonary disease.  ASSESSMENT: 1. Chest pain, rule out acute coronary syndrome. 2. Rule out  pulmonary embolism in the setting of history of thyroid     cancer as well as use of intrauterine device. 3. Hypokalemia.  PLAN:  The patient will be admitted to telemetry.  We will cycle cardiac enzymes.  We will place her on full-dose aspirin as well as sublingual nitro as needed.  The patient is bradycardic; therefore, no beta- blockers indicated.  We will order a 2-D echo to rule out wall motion abnormalities because of the fact that she is in Mirena.  We will also do a stat D-dimer.  If elevated, we will do a CT angio to rule out a PE.  We will also check a TSH because of the history of hypothyroidism and thyroid cancer and a free T4.  She is a full  code.     Richarda Overlie, MD     NA/MEDQ  D:  09/11/2010  T:  09/11/2010  Job:  [id number]  Electronically Signed by Richarda Overlie MD on 10/17/2010 01:23:15 PM

## 2010-10-24 ENCOUNTER — Ambulatory Visit (INDEPENDENT_AMBULATORY_CARE_PROVIDER_SITE_OTHER): Payer: BC Managed Care – PPO | Admitting: *Deleted

## 2010-10-24 DIAGNOSIS — I2510 Atherosclerotic heart disease of native coronary artery without angina pectoris: Secondary | ICD-10-CM

## 2010-10-24 LAB — HEPATIC FUNCTION PANEL
ALT: 71 U/L — ABNORMAL HIGH (ref 0–35)
AST: 27 U/L (ref 0–37)
Bilirubin, Direct: 0.1 mg/dL (ref 0.0–0.3)
Total Bilirubin: 1.2 mg/dL (ref 0.3–1.2)
Total Protein: 7.4 g/dL (ref 6.0–8.3)

## 2010-11-06 ENCOUNTER — Ambulatory Visit (INDEPENDENT_AMBULATORY_CARE_PROVIDER_SITE_OTHER): Payer: Self-pay | Admitting: Cardiothoracic Surgery

## 2010-11-06 ENCOUNTER — Encounter: Payer: Self-pay | Admitting: Cardiothoracic Surgery

## 2010-11-06 VITALS — BP 105/77 | HR 75 | Resp 14 | Ht 68.0 in | Wt 216.0 lb

## 2010-11-06 DIAGNOSIS — I2510 Atherosclerotic heart disease of native coronary artery without angina pectoris: Secondary | ICD-10-CM

## 2010-11-06 DIAGNOSIS — Z951 Presence of aortocoronary bypass graft: Secondary | ICD-10-CM

## 2010-11-06 DIAGNOSIS — I2542 Coronary artery dissection: Secondary | ICD-10-CM

## 2010-11-06 NOTE — Patient Instructions (Signed)
Do not lift more than 20 lbs until 3 months after surgery. OK to start outpatient cardiac rehab if approved by cardiologist.

## 2010-11-06 NOTE — Progress Notes (Signed)
HPI The patient returns for 2 months postop check after emergency CABG x1 for spontaneous right coronary dissection. She is doing well without recurrent angina. She is walking 1/2-2 miles daily. She is scheduled to have a stress test with Dr. Tedra Senegal next week. She has resumed driving in light activity levels. She is currently being evaluated by GI medicine for elevated LFTs and for now her stats and is on hold. She did receive a short course of Crestor while she was hospitalized after surgery. No nausea. A recent abdominal ultrasound was normal liver normal gallbladder normal kidneys.  Current Outpatient Prescriptions  Medication Sig Dispense Refill  . aspirin 81 MG tablet Take 81 mg by mouth daily.        . diphenhydrAMINE (BENADRYL) 25 mg capsule Take 25 mg by mouth at bedtime as needed.        Marland Kitchen levonorgestrel (MIRENA) 20 MCG/24HR IUD 1 each by Intrauterine route once.        Marland Kitchen levothyroxine (SYNTHROID, LEVOTHROID) 150 MCG tablet Take 150 mcg by mouth daily.        . metoprolol tartrate (LOPRESSOR) 25 MG tablet Take 1/2 tablet twice a day       . omeprazole (PRILOSEC OTC) 20 MG tablet Take 20 mg by mouth daily.        Marland Kitchen oxycodone (OXY-IR) 5 MG capsule Take 5 mg by mouth every 4 (four) hours as needed.           Review of Systems: The patient denies fever weight loss or night sweats. She still has some mild incisional pain relieved by Tylenol. She has some intermittent headaches which are chronic. No pedal edema. Physical Exam Vital signs blood pressure 110/80. Pulse 70 and regular. Saturation 98% on room air. Breath sounds are clear and equal. Sternal incision and leg incision well-healed. Cardiac rhythm regular without murmur rub or gallop. No pedal edema.    Diagnostic Tests:  None   Impression: Excellent early postop course 2 months following emergency CABG x1 with vein graft to the distal RCA for spontaneous dissection. She will start cardiac rehabilitation after she is clear by  Dr. Tedra Senegal. She knows not to lift more than 20 pounds for another month. She will return here as needed.

## 2010-11-13 ENCOUNTER — Ambulatory Visit (INDEPENDENT_AMBULATORY_CARE_PROVIDER_SITE_OTHER): Payer: BC Managed Care – PPO | Admitting: Cardiology

## 2010-11-13 ENCOUNTER — Encounter: Payer: Self-pay | Admitting: Gastroenterology

## 2010-11-13 ENCOUNTER — Other Ambulatory Visit (INDEPENDENT_AMBULATORY_CARE_PROVIDER_SITE_OTHER): Payer: BC Managed Care – PPO

## 2010-11-13 ENCOUNTER — Other Ambulatory Visit: Payer: Self-pay | Admitting: *Deleted

## 2010-11-13 ENCOUNTER — Ambulatory Visit (INDEPENDENT_AMBULATORY_CARE_PROVIDER_SITE_OTHER): Payer: BC Managed Care – PPO | Admitting: Gastroenterology

## 2010-11-13 VITALS — BP 120/78 | HR 64 | Ht 68.0 in | Wt 218.0 lb

## 2010-11-13 DIAGNOSIS — D509 Iron deficiency anemia, unspecified: Secondary | ICD-10-CM

## 2010-11-13 DIAGNOSIS — Z951 Presence of aortocoronary bypass graft: Secondary | ICD-10-CM

## 2010-11-13 DIAGNOSIS — I2510 Atherosclerotic heart disease of native coronary artery without angina pectoris: Secondary | ICD-10-CM

## 2010-11-13 DIAGNOSIS — E7800 Pure hypercholesterolemia, unspecified: Secondary | ICD-10-CM

## 2010-11-13 LAB — CBC WITH DIFFERENTIAL/PLATELET
Basophils Absolute: 0 10*3/uL (ref 0.0–0.1)
HCT: 39.1 % (ref 36.0–46.0)
Lymphs Abs: 1.6 10*3/uL (ref 0.7–4.0)
MCHC: 34 g/dL (ref 30.0–36.0)
MCV: 82.5 fl (ref 78.0–100.0)
Monocytes Absolute: 0.4 10*3/uL (ref 0.1–1.0)
Platelets: 310 10*3/uL (ref 150.0–400.0)
RDW: 13.3 % (ref 11.5–14.6)

## 2010-11-13 LAB — HEPATIC FUNCTION PANEL
ALT: 60 U/L — ABNORMAL HIGH (ref 0–35)
Bilirubin, Direct: 0.1 mg/dL (ref 0.0–0.3)

## 2010-11-13 MED ORDER — METOPROLOL TARTRATE 25 MG PO TABS: ORAL_TABLET | ORAL | Status: DC

## 2010-11-13 NOTE — Progress Notes (Signed)
History of Present Illness: This is a 40 year old female improved substantially on recheck on October 1. She had borderline positive ANA at 1:40 and slightly low alpha-1 antitrypsin at 82. In addition she had a mild iron deficiency with a saturation of 11.4%. She has no complaints.  Current Medications, Allergies, Past Medical History, Past Surgical History, Family History and Social History were reviewed in [employer] record.  Physical Exam: General: Well developed , well nourished, no acute distress Head: Normocephalic and atraumatic Eyes:  sclerae anicteric, EOMI Ears: Normal auditory acuity Mouth: No deformity or lesions Lungs: Clear throughout to auscultation Heart: Regular rate and rhythm; no murmurs, rubs or bruits Abdomen: Soft, non tender and non distended. No masses, hepatosplenomegaly or hernias noted. Normal Bowel sounds Musculoskeletal: Symmetrical with no gross deformities  Neurological: Alert oriented x 4, grossly nonfocal Psychological:  Alert and cooperative. Normal mood and affect  Assessment and Recommendations:  1. Abnormal LFTs. Autoimmune hepatitis is possible but unlikely given the borderline positive ANA and negative anti-smooth muscle antibody. Possible alpha-1 antitrypsin deficiency although her level is only slightly below normal. Obtain viral hepatitis markers, CBC and repeat liver function tests today. If her LFTs remain elevated proceed with liver biopsy for further evaluation.  2. Iron deficiency. CBC today. Begin iron replacement if she is anemic.

## 2010-11-13 NOTE — Progress Notes (Signed)
Exercise Treadmill Test  Pre-Exercise Testing Evaluation Rhythm: normal sinus  Rate: 80   PR:  .19 QRS:  .08  QT:  .38 QTc: .43     Test  Exercise Tolerance Test Ordering MD: Shawnie Pons, MD  Interpreting MD:  Shawnie Pons, MD  Unique Test No: 1  Treadmill:  1  Indication for ETT: Post CABG  Contraindication to ETT: No   Stress Modality: exercise - treadmill  Cardiac Imaging Performed: non   Protocol: standard Bruce - maximal  Max BP: 166/78  Max MPHR (bpm):  179 85% MPR (bpm):  152  MPHR obtained (bpm):  173 % MPHR obtained: 95%  Reached 85% MPHR (min:sec):  6;20 Total Exercise Time (min-sec):  8:38  Workload in METS: 10.1 Borg Scale: 15  Reason ETT Terminated:  fatigue    ST Segment Analysis At Rest: normal ST segments - no evidence of significant ST depression With Exercise: no evidence of significant ST depression  Other Information Arrhythmia:  No Angina during ETT:  absent (0) Quality of ETT:  diagnostic  ETT Interpretation:  normal - no evidence of ischemia by ST analysis  Comments: Patient exercised without incident.  She tolerated this well. She presented with SCAD, and had urgent CABG because of the size of the vessel.  She has done well post surgery, and has a great attitude.    Recommendations: Continue medical therapy.  Low level exercise program with walking. Avoid running.  Continue analysis of her liver enzymes.  Being followed by Dr. Russella Dar

## 2010-11-13 NOTE — Patient Instructions (Addendum)
Go directly to the basement to have your labs drawn today.  We will call you with the results of these lab tests and to possible schedule your liver biopsy.  cc: Sigmund Hazel, MD

## 2010-11-14 ENCOUNTER — Other Ambulatory Visit: Payer: Self-pay | Admitting: Gastroenterology

## 2010-11-18 ENCOUNTER — Other Ambulatory Visit: Payer: Self-pay | Admitting: Physician Assistant

## 2010-11-19 ENCOUNTER — Encounter (HOSPITAL_COMMUNITY): Payer: Self-pay | Admitting: Pharmacy Technician

## 2010-11-21 ENCOUNTER — Ambulatory Visit (HOSPITAL_COMMUNITY)
Admission: RE | Admit: 2010-11-21 | Discharge: 2010-11-21 | Disposition: A | Discharge status: Home / Self Care | Payer: BC Managed Care – PPO | Source: Ambulatory Visit | Attending: Gastroenterology | Admitting: Gastroenterology

## 2010-11-21 ENCOUNTER — Other Ambulatory Visit: Payer: Self-pay | Admitting: Interventional Radiology

## 2010-11-21 DIAGNOSIS — R7989 Other specified abnormal findings of blood chemistry: Secondary | ICD-10-CM | POA: Insufficient documentation

## 2010-11-21 DIAGNOSIS — M25519 Pain in unspecified shoulder: Secondary | ICD-10-CM | POA: Insufficient documentation

## 2010-11-21 DIAGNOSIS — R110 Nausea: Secondary | ICD-10-CM | POA: Insufficient documentation

## 2010-11-21 LAB — CBC
HCT: 37.3 % (ref 36.0–46.0)
Hemoglobin: 12.8 g/dL (ref 12.0–15.0)
MCHC: 34.3 g/dL (ref 30.0–36.0)
RDW: 12.7 % (ref 11.5–15.5)
WBC: 6.5 10*3/uL (ref 4.0–10.5)

## 2010-11-21 LAB — PROTIME-INR
INR: 0.99 (ref 0.00–1.49)
Prothrombin Time: 13.3 seconds (ref 11.6–15.2)

## 2010-11-21 LAB — APTT: aPTT: 30 seconds (ref 24–37)

## 2010-11-21 MED ORDER — MIDAZOLAM HCL 5 MG/5ML IJ SOLN
INTRAMUSCULAR | Status: AC | PRN
  Administered 2010-11-21: 2 mg via INTRAVENOUS

## 2010-11-21 MED ORDER — ONDANSETRON HCL 4 MG/2ML IJ SOLN
INTRAMUSCULAR | Status: AC
  Administered 2010-11-21: 4 mg via INTRAVENOUS
  Filled 2010-11-21: qty 2

## 2010-11-21 MED ORDER — SODIUM CHLORIDE 0.9 % IV SOLN: INTRAVENOUS | Status: DC

## 2010-11-21 MED ORDER — FENTANYL CITRATE 0.05 MG/ML IJ SOLN
INTRAMUSCULAR | Status: AC | PRN
  Administered 2010-11-21: 100 ug via INTRAVENOUS

## 2010-11-21 NOTE — H&P (Signed)
[name] is an 40 y.o. female.   Chief Complaint: elevated liver enzymes HPI: Patient with history of elevated liver enzymes presents today for elective US guided random core liver biopsy.  Past Medical History  Diagnosis Date  . Hypothyroid   . Overweight   . CAD (coronary artery disease)     open heart surgery 09/12/10  . History of thyroid cancer   . GERD (gastroesophageal reflux disease)     Past Surgical History  Procedure Date  . Thyroidectomy   . Coronary artery bypass grafting x1 09/13/10    [name]  . Arm surgery     rt    Family History  Problem Relation Age of Onset  . Hypertension Mother   . Hypertension Father   . Hypertension Sister   . Coronary artery disease Maternal Grandfather   . Heart disease Maternal Aunt   . Lupus Maternal Aunt   . Diabetes Maternal Aunt   . Colon polyps Mother   . Hiatal hernia Father   . Colon cancer Neg Hx    Social History:  reports that she has never smoked. She has never used smokeless tobacco. She reports that she drinks alcohol. She reports that she does not use illicit drugs.  Allergies: No Known Allergies  Medications Prior to Admission  Medication Sig Dispense Refill  . acetaminophen (TYLENOL) 325 MG tablet Take 325 mg by mouth every 6 (six) hours as needed. Pain        . aspirin 81 MG tablet Take 81 mg by mouth every morning.       . diphenhydrAMINE (BENADRYL) 25 mg capsule Take 25 mg by mouth at bedtime.       Marland Kitchen levonorgestrel (MIRENA) 20 MCG/24HR IUD 1 each by Intrauterine route once.        Marland Kitchen levothyroxine (SYNTHROID, LEVOTHROID) 150 MCG tablet Take 150 mcg by mouth every morning.       . metoprolol tartrate (LOPRESSOR) 25 MG tablet Take 1/2 tablet twice a day  30 tablet  11  . omeprazole (PRILOSEC OTC) 20 MG tablet Take 20 mg by mouth daily.        Marland Kitchen oxycodone (OXY-IR) 5 MG capsule Take 5 mg by mouth every 4 (four) hours as needed. Pain        Medications Prior to Admission  Medication Dose Route  Frequency Provider Last Rate Last Dose  . 0.9 %  sodium chloride infusion   Intravenous Continuous Anselm Pancoast, PA       Labs: PT 13.3  INR 0.99   WBC 6.5   HGB  12.8  PLT   272 Review of Systems  Constitutional: Negative for fever and chills.  Respiratory: Negative for cough and shortness of breath.   Cardiovascular: Negative for chest pain.  Gastrointestinal: Negative for nausea, vomiting and abdominal pain.  Neurological: Negative for headaches.  Endo/Heme/Allergies: Does not bruise/bleed easily.    Blood pressure 121/75, pulse 68, height 5\' 8"  (1.727 m), weight 218 lb (98.884 kg), SpO2 96.00%. Physical Exam  Constitutional: She is oriented to person, place, and time. She appears well-developed and well-nourished.  Cardiovascular: Normal rate, regular rhythm and normal heart sounds.   Respiratory: Effort normal and breath sounds normal.  GI: Soft. Bowel sounds are normal. There is no tenderness.  Musculoskeletal: Normal range of motion.  Neurological: She is alert and oriented to person, place, and time.  Psychiatric: She has a normal mood and affect.     Assessment/Plan Patient with  history of elevated liver enzymes; plan is for US guided random core liver biopsy.  [name],D KEVIN 11/21/2010, 10:14 AM

## 2010-11-21 NOTE — Progress Notes (Signed)
Pt resting comfortable and states her rt shoulder pain is tolerable.

## 2010-11-21 NOTE — ED Notes (Signed)
MD at bedside. 

## 2010-11-21 NOTE — Progress Notes (Signed)
Pt states her nausea is improving and pain in shoulder has improved.  Dr. Grace Isaac has been notified of pt's status and VS.

## 2010-11-21 NOTE — Progress Notes (Signed)
Pt A & O x 3.  Pt c/o mild aching in shoulder only.  Pt states it is tolerable.

## 2010-11-21 NOTE — Progress Notes (Signed)
Pt states she feels nauseated, she states it started after Dr. Grace Isaac had her turn to her side for a couple minutes then back on her back.  Pt given cool wash cloth and lights turned out for comfort measures. VSS.

## 2010-11-21 NOTE — Procedures (Signed)
Successful US guided Core liver biopsy.  Three 18 gauge biopsies obtained.  No immediate complications.

## 2010-11-21 NOTE — ED Notes (Signed)
Patient denies pain and is resting comfortably.  

## 2010-11-21 NOTE — Progress Notes (Signed)
Notified Dr. Grace Isaac of pt's c/o of shoulder pain. New order given.

## 2010-11-21 NOTE — ED Notes (Signed)
Vital signs stable. 

## 2010-11-21 NOTE — Progress Notes (Signed)
Dr. Watts at pt's bedside.  

## 2010-11-26 ENCOUNTER — Other Ambulatory Visit: Payer: Self-pay

## 2010-12-02 ENCOUNTER — Telehealth (HOSPITAL_COMMUNITY): Payer: Self-pay | Admitting: *Deleted

## 2010-12-02 NOTE — Telephone Encounter (Signed)
Pt called to decline cardiac rehab due to exercising on own.

## 2011-01-17 ENCOUNTER — Telehealth: Payer: Self-pay | Admitting: Gastroenterology

## 2011-01-20 NOTE — Telephone Encounter (Signed)
Patient is due for LFT she is advised to come to basement anytime M-F 7:30-5

## 2011-01-21 ENCOUNTER — Ambulatory Visit (INDEPENDENT_AMBULATORY_CARE_PROVIDER_SITE_OTHER): Payer: BC Managed Care – PPO | Admitting: Cardiology

## 2011-01-21 ENCOUNTER — Other Ambulatory Visit (INDEPENDENT_AMBULATORY_CARE_PROVIDER_SITE_OTHER): Payer: BC Managed Care – PPO

## 2011-01-21 ENCOUNTER — Encounter: Payer: Self-pay | Admitting: Cardiology

## 2011-01-21 ENCOUNTER — Other Ambulatory Visit: Payer: Self-pay

## 2011-01-21 VITALS — BP 118/76 | HR 73 | Ht 68.0 in | Wt 224.8 lb

## 2011-01-21 DIAGNOSIS — R7989 Other specified abnormal findings of blood chemistry: Secondary | ICD-10-CM

## 2011-01-21 DIAGNOSIS — R945 Abnormal results of liver function studies: Secondary | ICD-10-CM

## 2011-01-21 DIAGNOSIS — E039 Hypothyroidism, unspecified: Secondary | ICD-10-CM

## 2011-01-21 DIAGNOSIS — I2510 Atherosclerotic heart disease of native coronary artery without angina pectoris: Secondary | ICD-10-CM

## 2011-01-21 LAB — HEPATIC FUNCTION PANEL
Albumin: 4 g/dL (ref 3.5–5.2)
Total Bilirubin: 1.4 mg/dL — ABNORMAL HIGH (ref 0.3–1.2)

## 2011-01-21 NOTE — Patient Instructions (Signed)
Your physician wants you to follow-up in: 6 MONTHS.  You will receive a reminder letter in the mail two months in advance. If you don't receive a letter, please call our office to schedule the follow-up appointment.  Your physician recommends that you continue on your current medications as directed. Please refer to the Current Medication list given to you today.  

## 2011-01-21 NOTE — Progress Notes (Signed)
   HPI:  Doing well.  Wants to resume some activity.  No chest pain.  Tolerating meds.    Current Outpatient Prescriptions  Medication Sig Dispense Refill  . acetaminophen (TYLENOL) 325 MG tablet Take 325 mg by mouth every 6 (six) hours as needed. Pain        . aspirin 81 MG tablet Take 81 mg by mouth every morning.       . diphenhydrAMINE (BENADRYL) 25 mg capsule Take 25 mg by mouth at bedtime.       Marland Kitchen levonorgestrel (MIRENA) 20 MCG/24HR IUD 1 each by Intrauterine route once.        Marland Kitchen levothyroxine (SYNTHROID, LEVOTHROID) 150 MCG tablet Take 150 mcg by mouth every morning.       . metoprolol tartrate (LOPRESSOR) 25 MG tablet Take 1/2 tablet twice a day  30 tablet  11  . omeprazole (PRILOSEC OTC) 20 MG tablet Take 20 mg by mouth daily.        Marland Kitchen oxycodone (OXY-IR) 5 MG capsule Take 5 mg by mouth every 4 (four) hours as needed. Pain         No Known Allergies  Past Medical History  Diagnosis Date  . Hypothyroid   . Overweight   . CAD (coronary artery disease)     open heart surgery 09/12/10  . History of thyroid cancer   . GERD (gastroesophageal reflux disease)     Past Surgical History  Procedure Date  . Thyroidectomy   . Coronary artery bypass grafting x1 09/13/10    [name]  . Arm surgery     rt    Family History  Problem Relation Age of Onset  . Hypertension Mother   . Hypertension Father   . Hypertension Sister   . Coronary artery disease Maternal Grandfather   . Heart disease Maternal Aunt   . Lupus Maternal Aunt   . Diabetes Maternal Aunt   . Colon polyps Mother   . Hiatal hernia Father   . Colon cancer Neg Hx     History   Social History  . Marital Status: Married    Spouse Name: N/A    Number of Children: 2  . Years of Education: N/A   Occupational History  .  [employer]   Social History Main Topics  . Smoking status: Never Smoker   . Smokeless tobacco: Never Used  . Alcohol Use: Yes     rarely  . Drug Use: No  . Sexually Active:  Yes    Birth Control/ Protection: IUD   Other Topics Concern  . Not on file   Social History Narrative  . No narrative on file    ROS: Please see the HPI.  All other systems reviewed and negative.  PHYSICAL EXAM:  BP 118/76  Pulse 73  Ht 5\' 8"  (1.727 m)  Wt 101.969 kg (224 lb 12.8 oz)  BMI 34.18 kg/m2  General: Well developed, well nourished, in no acute distress. Head:  Normocephalic and atraumatic. Neck: no JVD Lungs: Clear to auscultation and percussion. Heart: Normal S1 and S2.  No murmur, rubs or gallops.  Pulses: Pulses normal in all 4 extremities. Extremities: No clubbing or cyanosis. No edema.  Harvest site looks good.   Neurologic: Alert and oriented x 3.  EKG:  NSR.  Delay in R wave progression, likely from lead position.  ASSESSMENT AND PLAN:

## 2011-01-26 DIAGNOSIS — R945 Abnormal results of liver function studies: Secondary | ICD-10-CM | POA: Insufficient documentation

## 2011-01-26 NOTE — Assessment & Plan Note (Signed)
She presented with SCAD.  She is currently stable and improved.  She would like to resume some activity.  I told her that light aerobic activity would be ok, but discouraged running for her.  She will do this.  Options discussed.

## 2011-01-26 NOTE — Assessment & Plan Note (Signed)
She had original LFT abnormalities.  They are improving off of statins.  She had SCAD.  Biopsy does not appear revealing  (defer to Dr. Russella Dar).  I am not inclined to place her back on statin as LDL 98, and pathology of her presentation was different.

## 2011-01-26 NOTE — Assessment & Plan Note (Signed)
On replacement.  Needs TSH rechecked.

## 2011-01-27 ENCOUNTER — Telehealth: Payer: Self-pay

## 2011-01-27 DIAGNOSIS — E039 Hypothyroidism, unspecified: Secondary | ICD-10-CM

## 2011-01-27 NOTE — Telephone Encounter (Signed)
[name]  She needs a TSH rechecked. Last was 0.20 and improving. Can you arrange either with Dr. Rondel Baton office or here.  Thanks  TS

## 2011-01-30 NOTE — Telephone Encounter (Signed)
I spoke with the pt and she will come into our office and have a TSH rechecked next week.  The pt said she would like to change to a new endocrinologist.

## 2011-02-04 ENCOUNTER — Other Ambulatory Visit: Payer: BC Managed Care – PPO | Admitting: *Deleted

## 2011-02-05 ENCOUNTER — Other Ambulatory Visit (INDEPENDENT_AMBULATORY_CARE_PROVIDER_SITE_OTHER): Payer: BC Managed Care – PPO | Admitting: *Deleted

## 2011-02-05 DIAGNOSIS — E039 Hypothyroidism, unspecified: Secondary | ICD-10-CM

## 2011-02-05 LAB — TSH: TSH: 3.76 u[IU]/mL (ref 0.35–5.50)

## 2011-02-05 LAB — HEPATIC FUNCTION PANEL
AST: 21 U/L (ref 0–37)
Alkaline Phosphatase: 96 U/L (ref 39–117)
Bilirubin, Direct: 0.2 mg/dL (ref 0.0–0.3)

## 2011-02-06 ENCOUNTER — Other Ambulatory Visit: Payer: Self-pay

## 2011-02-07 ENCOUNTER — Other Ambulatory Visit: Payer: Self-pay

## 2011-04-18 ENCOUNTER — Encounter: Payer: Self-pay | Admitting: Cardiology

## 2011-05-06 ENCOUNTER — Telehealth: Payer: Self-pay | Admitting: Cardiology

## 2011-05-06 NOTE — Telephone Encounter (Signed)
I spoke with the pt and she complains of some discomfort above her left breast near the sternum.  This has been off and on for 2 weeks. The discomfort does improve when she presses on chest. The pt denies CP and said that she has been under a lot of stress the past few weeks. I scheduled the pt to see Dr Riley Kill on 05/07/11.

## 2011-05-06 NOTE — Telephone Encounter (Signed)
Pt having some discomfort in chest for about a week, not having pain, wants to know if she should be seen or not, had open heart surgery in august

## 2011-05-07 ENCOUNTER — Ambulatory Visit (INDEPENDENT_AMBULATORY_CARE_PROVIDER_SITE_OTHER): Payer: BC Managed Care – PPO | Admitting: Cardiology

## 2011-05-07 ENCOUNTER — Encounter: Payer: Self-pay | Admitting: Cardiology

## 2011-05-07 DIAGNOSIS — R0789 Other chest pain: Secondary | ICD-10-CM

## 2011-05-07 DIAGNOSIS — R7989 Other specified abnormal findings of blood chemistry: Secondary | ICD-10-CM

## 2011-05-07 DIAGNOSIS — R945 Abnormal results of liver function studies: Secondary | ICD-10-CM

## 2011-05-07 DIAGNOSIS — R079 Chest pain, unspecified: Secondary | ICD-10-CM

## 2011-05-07 DIAGNOSIS — Z85850 Personal history of malignant neoplasm of thyroid: Secondary | ICD-10-CM

## 2011-05-07 LAB — CARDIAC PANEL: Relative Index: 3.9 calc — ABNORMAL HIGH (ref 0.0–2.5)

## 2011-05-07 NOTE — Patient Instructions (Signed)
Your physician wants you to follow-up in:   DUE IN  June OR July  2013\ You will receive a reminder letter in the mail two months in advance. If you don't receive a letter, please call our office to schedule the follow-up appointment. Your physician recommends that you continue on your current medications as directed. Please refer to the Current Medication list given to you today. Your physician recommends that you return for lab work in: TODAY  TROPONIN  AND CK PANEL  DX CHEST PAIN Non-Cardiac CT Angiography (CTA), is a special type of CT scan that uses a computer to produce multi-dimensional views of major blood vessels throughout the body. In CT angiography, a contrast material is injected through an IV to help visualize the blood vessels  DX CHEST PAIN

## 2011-05-11 DIAGNOSIS — R079 Chest pain, unspecified: Secondary | ICD-10-CM | POA: Insufficient documentation

## 2011-05-11 NOTE — Assessment & Plan Note (Addendum)
Her chest pain is somewhat atypical.  It is stress related.  There are no acute ECG changes.  Regarding mechanism, I suspect, as does she, that some emotional distress is having a component.  Regarding treatment, I think we should check a set of cardiac enzymes.  This would be helfpul.  Previously, they were positive which was the tip off.  Her coronary anatomy demonstrated modest narrowing of the RCA by angio, but a large hematoma by IVUS, consistent with our series on SCAD.  I also think at this point a CTA of the coronaries, done on the 264 slice scanner might be helpful.  As there is a relationship with FMD, some of this might be observed, and we would have a way to follow her seeing that the MAYO series suggests a potential for recurrence, even though we have not seen that here.  With the new scanner, the dosimetry should be low  (history of thyroid cancer).

## 2011-05-11 NOTE — Progress Notes (Signed)
HPI:  [name] is in for follow up.  She has had some chest discomfort recently and wanted to get checked.  She is in the midst of some difficult personal issues related to her marriage, and she thinks this is kicking things up a bit.  It seems somewhat related to stress.  It is not exertion per se related, but rather just there, and more oriented to the left of the sternum.  No diaphoresis, nausea, vomiting, or other symptoms.  We talked SCAD in detail.    Current Outpatient Prescriptions  Medication Sig Dispense Refill  . acetaminophen (TYLENOL) 325 MG tablet Take 325 mg by mouth every 6 (six) hours as needed. Pain        . aspirin 81 MG tablet Take 81 mg by mouth every morning.       . diphenhydrAMINE (BENADRYL) 25 mg capsule Take 25 mg by mouth at bedtime.       Marland Kitchen levonorgestrel (MIRENA) 20 MCG/24HR IUD 1 each by Intrauterine route once.        Marland Kitchen levothyroxine (SYNTHROID, LEVOTHROID) 150 MCG tablet Take 150 mcg by mouth every morning.       . metoprolol tartrate (LOPRESSOR) 25 MG tablet Take 1/2 tablet twice a day  30 tablet  11  . omeprazole (PRILOSEC OTC) 20 MG tablet Take 20 mg by mouth daily.        Marland Kitchen oxycodone (OXY-IR) 5 MG capsule Take 5 mg by mouth every 4 (four) hours as needed. Pain         No Known Allergies  Past Medical History  Diagnosis Date  . Hypothyroid   . Overweight   . CAD (coronary artery disease)     open heart surgery 09/12/10  . History of thyroid cancer   . GERD (gastroesophageal reflux disease)     Past Surgical History  Procedure Date  . Thyroidectomy   . Coronary artery bypass grafting x1 09/13/10    [name]  . Arm surgery     rt    Family History  Problem Relation Age of Onset  . Hypertension Mother   . Hypertension Father   . Hypertension Sister   . Coronary artery disease Maternal Grandfather   . Heart disease Maternal Aunt   . Lupus Maternal Aunt   . Diabetes Maternal Aunt   . Colon polyps Mother   . Hiatal hernia Father   .  Colon cancer Neg Hx     History   Social History  . Marital Status: Married    Spouse Name: N/A    Number of Children: 2  . Years of Education: N/A   Occupational History  .  [employer]   Social History Main Topics  . Smoking status: Never Smoker   . Smokeless tobacco: Never Used  . Alcohol Use: Yes     rarely  . Drug Use: No  . Sexually Active: Yes    Birth Control/ Protection: IUD   Other Topics Concern  . Not on file   Social History Narrative  . No narrative on file    ROS: Please see the HPI.  All other systems reviewed and negative.  PHYSICAL EXAM:  BP 110/84  Pulse 62  Ht 5\' 8"  (1.727 m)  Wt 217 lb (98.431 kg)  BMI 32.99 kg/m2  General: Well developed, well nourished, in no acute distress. Head:  Normocephalic and atraumatic. Neck: no JVD Lungs: Clear to auscultation and percussion. Sternotomy well healed.  Heart: Normal S1 and S2.  No murmur, rubs or gallops.  Abdomen:  Normal bowel sounds; soft; non tender; no organomegaly Pulses: Pulses normal in all 4 extremities. Extremities: No clubbing or cyanosis. No edema. Neurologic: Alert and oriented x 3.  EKG:  NSR.  Delay R wave progression.  No acute changes.   ASSESSMENT AND PLAN:

## 2011-05-11 NOTE — Assessment & Plan Note (Signed)
Notes received

## 2011-05-11 NOTE — Assessment & Plan Note (Signed)
Last set were normal.  Dr. Russella Dar suggested six month follow up.  Statins not as important per se, as not clear any relationship. Her IVUS was mainly hematoma.

## 2011-05-13 ENCOUNTER — Other Ambulatory Visit: Payer: Self-pay

## 2011-05-13 DIAGNOSIS — R079 Chest pain, unspecified: Secondary | ICD-10-CM

## 2011-05-14 ENCOUNTER — Encounter: Payer: Self-pay | Admitting: *Deleted

## 2011-05-15 ENCOUNTER — Telehealth: Payer: Self-pay | Admitting: Cardiology

## 2011-05-15 ENCOUNTER — Other Ambulatory Visit (INDEPENDENT_AMBULATORY_CARE_PROVIDER_SITE_OTHER): Payer: BC Managed Care – PPO

## 2011-05-15 DIAGNOSIS — R079 Chest pain, unspecified: Secondary | ICD-10-CM

## 2011-05-16 ENCOUNTER — Telehealth: Payer: Self-pay | Admitting: *Deleted

## 2011-05-16 LAB — BASIC METABOLIC PANEL
CO2: 26 mEq/L (ref 19–32)
Chloride: 105 mEq/L (ref 96–112)
Creatinine, Ser: 0.8 mg/dL (ref 0.4–1.2)
Glucose, Bld: 120 mg/dL — ABNORMAL HIGH (ref 70–99)

## 2011-05-16 NOTE — Telephone Encounter (Signed)
Message copied by Carmelina Paddock on Fri May 16, 2011  7:43 AM ------      Message from: Carmelina Paddock      Created: Fri May 16, 2011  7:01 AM      Regarding: FW: Cardiac CTA                   ----- Message -----         From: Carmelina Paddock         Sent: 05/16/2011   7:00 AM           To: Connye Burkitt, NT      Subject: FW: Cardiac CTA                                          Good morning Merita Norton,            Per Dr. Riley Kill please cancel Cardiac CTA.            Thanks,            Morrie Sheldon            ----- Message -----         From: Sharyn Blitz, RN         Sent: 05/15/2011   4:44 PM           To: Carmelina Paddock      Subject: RE: Cardiac CTA                                          Dr Riley Kill spoke with the pt and decided to cancel cardiac cta      ----- Message -----         From: Carmelina Paddock         Sent: 05/14/2011   9:15 AM           To: Sharyn Blitz, RN, Herby Abraham, MD      Subject: Cardiac CTA                                              Needs MD review for Cardiac CTA.            Insurance RN unable to approve at her level due to no recent cardiac testing.            Case will close 05/15/11            Please call             [phone] option 2      ID# [id number]      DOB: 17-Apr-2070            Thanks,            Morrie Sheldon

## 2011-05-21 ENCOUNTER — Other Ambulatory Visit (HOSPITAL_COMMUNITY): Payer: BC Managed Care – PPO

## 2011-05-30 ENCOUNTER — Ambulatory Visit (INDEPENDENT_AMBULATORY_CARE_PROVIDER_SITE_OTHER): Payer: BC Managed Care – PPO | Admitting: Cardiology

## 2011-05-30 ENCOUNTER — Encounter: Payer: Self-pay | Admitting: Cardiology

## 2011-05-30 VITALS — BP 110/84 | HR 64 | Ht 68.0 in | Wt 221.0 lb

## 2011-05-30 DIAGNOSIS — R945 Abnormal results of liver function studies: Secondary | ICD-10-CM

## 2011-05-30 DIAGNOSIS — R079 Chest pain, unspecified: Secondary | ICD-10-CM

## 2011-05-30 DIAGNOSIS — R7989 Other specified abnormal findings of blood chemistry: Secondary | ICD-10-CM

## 2011-05-30 DIAGNOSIS — I2510 Atherosclerotic heart disease of native coronary artery without angina pectoris: Secondary | ICD-10-CM

## 2011-05-30 NOTE — Progress Notes (Signed)
HPI:  The patient is in for followup. She still notes that in stressful conditions she has the discomfort up along her left chest in the parasternal region. It is in a different location from what she originally presented with. She did not get short of breath nor has diaphoresis. It is unlike what she had at the time of her cardiac event. She notes this merrily during times of stress, and she is currently in a difficult situation with regard to her personal life.  Current Outpatient Prescriptions  Medication Sig Dispense Refill  . acetaminophen (TYLENOL) 325 MG tablet Take 325 mg by mouth every 6 (six) hours as needed. Pain        . aspirin 81 MG tablet Take 81 mg by mouth every morning.       . diphenhydrAMINE (BENADRYL) 25 mg capsule Take 25 mg by mouth at bedtime.       Marland Kitchen levonorgestrel (MIRENA) 20 MCG/24HR IUD 1 each by Intrauterine route once.        Marland Kitchen levothyroxine (SYNTHROID, LEVOTHROID) 150 MCG tablet Take 150 mcg by mouth every morning.       . metoprolol tartrate (LOPRESSOR) 25 MG tablet Take 1/2 tablet twice a day  30 tablet  11  . omeprazole (PRILOSEC OTC) 20 MG tablet Take 20 mg by mouth daily.        Marland Kitchen oxycodone (OXY-IR) 5 MG capsule Take 5 mg by mouth every 4 (four) hours as needed. Pain         No Known Allergies  Past Medical History  Diagnosis Date  . Hypothyroid   . Overweight   . CAD (coronary artery disease)     open heart surgery 09/12/10  . History of thyroid cancer   . GERD (gastroesophageal reflux disease)     Past Surgical History  Procedure Date  . Thyroidectomy   . Coronary artery bypass grafting x1 09/13/10    [name]  . Arm surgery     rt    Family History  Problem Relation Age of Onset  . Hypertension Mother   . Hypertension Father   . Hypertension Sister   . Coronary artery disease Maternal Grandfather   . Heart disease Maternal Aunt   . Lupus Maternal Aunt   . Diabetes Maternal Aunt   . Colon polyps Mother   . Hiatal hernia Father     . Colon cancer Neg Hx     History   Social History  . Marital Status: Married    Spouse Name: N/A    Number of Children: 2  . Years of Education: N/A   Occupational History  .  [employer]   Social History Main Topics  . Smoking status: Never Smoker   . Smokeless tobacco: Never Used  . Alcohol Use: Yes     rarely  . Drug Use: No  . Sexually Active: Yes    Birth Control/ Protection: IUD   Other Topics Concern  . Not on file   Social History Narrative  . No narrative on file    ROS: Please see the HPI.  All other systems reviewed and negative.  PHYSICAL EXAM:  BP 110/84  Pulse 64  Ht 5\' 8"  (1.727 m)  Wt 221 lb (100.245 kg)  BMI 33.60 kg/m2  General: Well developed, well nourished, in no acute distress. Head:  Normocephalic and atraumatic. Neck: no JVD Lungs: Clear to auscultation and percussion. Heart: Normal S1 and S2.  No murmur, rubs  or gallops.  Chest:  nontender Pulses: Pulses normal in all 4 extremities. Extremities: No clubbing or cyanosis. No edema. Neurologic: Alert and oriented x 3.  EKG:  NSR.  WNL. ASSESSMENT AND PLAN:  More than 30 minutes total evaluation and note preparation time.

## 2011-05-30 NOTE — Patient Instructions (Signed)
Your physician recommends that you schedule a follow-up appointment in: 3 WEEKS  Your physician recommends that you continue on your current medications as directed. Please refer to the Current Medication list given to you today.  

## 2011-05-31 NOTE — Assessment & Plan Note (Signed)
She seems pretty convinced that her current symptoms are related to stress.  The location is different, she has not other associated symptoms.  It seems to correlate with the stress in her life presently, which is complex and stressful for her.  We will continue to monitor her closely.  I reviewed her films in detail last pm in the cath lab, and we will continue to follow her.

## 2011-05-31 NOTE — Assessment & Plan Note (Addendum)
These have been scheduled by Dr. Russella Dar for six months follow up.  Path report without fibrosis.  We have discussed later eval for FMD.

## 2011-06-16 ENCOUNTER — Encounter: Payer: Self-pay | Admitting: Endocrinology

## 2011-06-16 ENCOUNTER — Other Ambulatory Visit (INDEPENDENT_AMBULATORY_CARE_PROVIDER_SITE_OTHER): Payer: BC Managed Care – PPO

## 2011-06-16 ENCOUNTER — Ambulatory Visit (INDEPENDENT_AMBULATORY_CARE_PROVIDER_SITE_OTHER): Payer: BC Managed Care – PPO | Admitting: Endocrinology

## 2011-06-16 VITALS — BP 118/82 | HR 77 | Temp 97.7°F | Ht 68.0 in | Wt 221.0 lb

## 2011-06-16 DIAGNOSIS — C73 Malignant neoplasm of thyroid gland: Secondary | ICD-10-CM | POA: Insufficient documentation

## 2011-06-16 DIAGNOSIS — E890 Postprocedural hypothyroidism: Secondary | ICD-10-CM | POA: Insufficient documentation

## 2011-06-16 LAB — BASIC METABOLIC PANEL
BUN: 10 mg/dL (ref 6–23)
Chloride: 108 mEq/L (ref 96–112)
Creatinine, Ser: 1 mg/dL (ref 0.4–1.2)
GFR: 68.41 mL/min (ref >60.00)
Glucose, Bld: 83 mg/dL (ref 70–99)

## 2011-06-16 LAB — TSH: TSH: 0.45 u[IU]/mL (ref 0.35–5.50)

## 2011-06-16 NOTE — Patient Instructions (Signed)
blood tests are being requested for you today.  You will receive a letter with results. Please return in 1 year. pending the test results, please continue the same thyroid medication for now

## 2011-06-16 NOTE — Progress Notes (Signed)
Subjective:    Patient ID: [name], female    DOB: [birth year], 41 y.o.   MRN: [id number]  HPI Pt had thyroidectomy in approx 2006 for papillary adenocarcinoma of the thyroid cancer.  She then had adjuvant i-131 rx at [facility].  she had a nuc med scan a few years later, which pt says was negative.  She says her cancer blood test has been neg also.  She says synthroid was decreased, with the goal of a normal tsh, because of her long disease-free interval.   Pt states few years of slight intermittent headache, throughout the head, but no assoc sxs. Past Medical History  Diagnosis Date  . Hypothyroid   . Overweight   . CAD (coronary artery disease)     open heart surgery 09/12/10  . History of thyroid cancer   . GERD (gastroesophageal reflux disease)     Past Surgical History  Procedure Date  . Thyroidectomy   . Coronary artery bypass grafting x1 09/13/10    [name]  . Arm surgery     rt    History   Social History  . Marital Status: Married    Spouse Name: N/A    Number of Children: 2  . 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 Smoking status: Never Smoker   . Smokeless tobacco: Never Used  . Alcohol Use: Yes     rarely  . Drug Use: No  . Sexually Active: Yes    Birth Control/ Protection: IUD   Other Topics Concern  . Not on file   Social History Narrative   Regular exercise-yesCaffeine Use-yes    Current Outpatient Prescriptions on File Prior to Visit  Medication Sig Dispense Refill  . acetaminophen (TYLENOL) 325 MG tablet Take 325 mg by mouth every 6 (six) hours as needed. Pain        . aspirin 81 MG tablet Take 81 mg by mouth every morning.       . diphenhydrAMINE (BENADRYL) 25 mg capsule Take 25 mg by mouth at bedtime.       Marland Kitchen levonorgestrel (MIRENA) 20 MCG/24HR IUD 1 each by Intrauterine route once.        Marland Kitchen levothyroxine (SYNTHROID, LEVOTHROID) 150 MCG tablet Take 150 mcg by mouth  every morning.       . metoprolol tartrate (LOPRESSOR) 25 MG tablet Take 1/2 tablet twice a day  30 tablet  11  . omeprazole (PRILOSEC) 20 MG capsule Take 20 mg by mouth daily.       Marland Kitchen oxycodone (OXY-IR) 5 MG capsule Take 5 mg by mouth every 4 (four) hours as needed. Pain        No Known Allergies  Family History  Problem Relation Age of Onset  . Hypertension Mother   . Hypertension Father   . Hypertension Sister   . Coronary artery disease Maternal Grandfather   . Heart disease Maternal Aunt   . Lupus Maternal Aunt   . Diabetes Maternal Aunt   . Colon polyps Mother   . Hiatal hernia Father   . Colon cancer Neg Hx    BP 118/82  Pulse 77  Temp(Src) 97.7 F (36.5 C) (Oral)  Ht 5\' 8"  (1.727 m)  Wt 221 lb (100.245 kg)  BMI 33.60 kg/m2  SpO2 97%  Review of Systems denies weight loss, hoarseness, double vision, palpitations, sob, diarrhea, polyuria, myalgias, neck pain, numbness, tremor,  anxiety, easy bruising, and rhinorrhea.  She has intermittent headache, easy bruising, and excessive diaphoresis--all chronic.  She has reg menses.    Objective:   Physical Exam VS: see vs page GEN: no distress HEAD: head: no deformity eyes: no periorbital swelling, no proptosis external nose and ears are normal mouth: no lesion seen NECK: a healed scar is present.  i do not appreciate a nodule in the thyroid or elsewhere in the neck CHEST WALL: no deformity.  Healed medial sternotomy scar LUNGS:  Clear to auscultation CV: reg rate and rhythm, no murmur ABD: abdomen is soft, nontender.  no hepatosplenomegaly.  not distended.  no hernia. MUSCULOSKELETAL: muscle bulk and strength are grossly normal.  no obvious joint swelling.  gait is normal and steady EXTEMITIES: no deformity.  no ulcer on the feet.  feet are of normal color and temp.  no edema NEURO:  cn 2-12 grossly intact.   readily moves all 4's.  sensation is diffusely intact to touch SKIN:  Normal texture and temperature.  No rash or  suspicious lesion is visible.   NODES:  None palpable at the neck.   PSYCH: alert, oriented x3.  Does not appear anxious nor depressed.  outside test results are reviewed: i reviewed pathol report:  Largest diameter is 27 mm.  It is near but does not involve the margins Lab Results  Component Value Date   TSH 0.45 06/16/2011   T4TOTAL 5.3 09/13/2010  Thyroglobulin 0.8     Assessment & Plan:  Stage-1 papillary adenocarcinoma of the thyroid.  No evidence of recurrence Postsurgical hypothyroidism.  In view of her long disease-free interval and low stage, normal tsh is appropriate. Headache, not thyroid-related

## 2011-06-17 ENCOUNTER — Telehealth: Payer: Self-pay | Admitting: Endocrinology

## 2011-06-17 NOTE — Telephone Encounter (Signed)
Received 16 pages from [facility], sent to Dr. Everardo All. 06/17/11/SD.

## 2011-06-18 ENCOUNTER — Encounter: Payer: Self-pay | Admitting: Endocrinology

## 2011-06-18 DIAGNOSIS — K219 Gastro-esophageal reflux disease without esophagitis: Secondary | ICD-10-CM | POA: Insufficient documentation

## 2011-06-23 ENCOUNTER — Encounter: Payer: Self-pay | Admitting: Cardiology

## 2011-06-23 ENCOUNTER — Ambulatory Visit (INDEPENDENT_AMBULATORY_CARE_PROVIDER_SITE_OTHER): Payer: BC Managed Care – PPO | Admitting: Cardiology

## 2011-06-23 VITALS — BP 108/74 | HR 73 | Ht 68.0 in | Wt 222.0 lb

## 2011-06-23 DIAGNOSIS — I2510 Atherosclerotic heart disease of native coronary artery without angina pectoris: Secondary | ICD-10-CM

## 2011-06-23 DIAGNOSIS — R079 Chest pain, unspecified: Secondary | ICD-10-CM

## 2011-06-23 NOTE — Progress Notes (Signed)
HPI:  The patient returns in followup. In general, things are actually better at home. However, she continues to have persistent left parasternal chest pain. The discomfort at times almost constant. It is not as severe as when she came in with her non-ST elevation myocardial infarction. However it is there. A previous cardiac enzymes were negative. Talked about doing a CAT scan of the coronary vessels in the vein graft to the right. It is important to note the patient presented with SCAD  (spontaneous coronary artery dissection).  She had a large hematoma in the RCA with a large distal vessel, and we recommended urgent surgery .  She has no clinical evidence of FMD.  Her symptoms are fairly constant, but she does look good overall.    Current Outpatient Prescriptions  Medication Sig Dispense Refill  . acetaminophen (TYLENOL) 325 MG tablet Take 325 mg by mouth every 6 (six) hours as needed. Pain        . aspirin 81 MG tablet Take 81 mg by mouth every morning.       . diphenhydrAMINE (BENADRYL) 25 mg capsule Take 25 mg by mouth at bedtime.       Marland Kitchen levonorgestrel (MIRENA) 20 MCG/24HR IUD 1 each by Intrauterine route once.        Marland Kitchen levothyroxine (SYNTHROID, LEVOTHROID) 150 MCG tablet Take 150 mcg by mouth every morning.       . metoprolol tartrate (LOPRESSOR) 25 MG tablet Take 1/2 tablet twice a day  30 tablet  11  . omeprazole (PRILOSEC) 20 MG capsule Take 20 mg by mouth daily.       Marland Kitchen oxycodone (OXY-IR) 5 MG capsule Take 5 mg by mouth every 4 (four) hours as needed. Pain         No Known Allergies  Past Medical History  Diagnosis Date  . Hypothyroid   . Overweight   . CAD (coronary artery disease)     open heart surgery 09/12/10  . History of thyroid cancer   . GERD (gastroesophageal reflux disease)     Past Surgical History  Procedure Date  . Thyroidectomy   . Coronary artery bypass grafting x1 09/13/10    [name]  . Arm surgery     rt    Family History  Problem Relation Age  of Onset  . Hypertension Mother   . Hypertension Father   . Hypertension Sister   . Coronary artery disease Maternal Grandfather   . Heart disease Maternal Aunt   . Lupus Maternal Aunt   . Diabetes Maternal Aunt   . Colon polyps Mother   . Hiatal hernia Father   . Colon cancer Neg Hx     History   Social History  . Marital Status: Married    Spouse Name: N/A    Number of Children: 2  . 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 Smoking status: Never Smoker   . Smokeless tobacco: Never Used  . Alcohol Use: Yes     rarely  . Drug Use: No  . Sexually Active: Yes    Birth Control/ Protection: IUD   Other Topics Concern  . Not on file   Social History Narrative   Regular exercise-yesCaffeine Use-yes    ROS: Please see the HPI.  All other systems reviewed and negative.  PHYSICAL EXAM:  BP 108/74  Pulse 73  Ht 5\' 8"  (1.727 m)  Wt 222 lb (100.699 kg)  BMI 33.76 kg/m2  General: Well developed, well nourished, in no acute distress. Head:  Normocephalic and atraumatic. Neck: no JVD.  No carotid bruits.   Lungs: Clear to auscultation and percussion. Heart: Normal S1 and S2.  No murmur, rubs or gallops.  Abdomen:  Normal bowel sounds; soft; non tender; no organomegaly Pulses: Pulses normal in all 4 extremities. Extremities: No clubbing or cyanosis. No edema. Neurologic: Alert and oriented x 3.  EKG:  NSR.  Cannot exlude anterior MI.    ASSESSMENT AND PLAN:

## 2011-06-23 NOTE — Patient Instructions (Signed)
Your physician recommends that you schedule a follow-up appointment in: 3 WEEKS  Your physician has requested that you have cardiac CT. Cardiac computed tomography (CT) is a painless test that uses an x-ray machine to take clear, detailed pictures of your heart. For further information please visit [email or web address] Please follow instruction sheet as given.

## 2011-06-23 NOTE — Assessment & Plan Note (Addendum)
Other current symptoms are difficult. They seem to get worse when she is taking Tylenol Advil 3 times a day because of the persistent discomfort. She has no acute EKG changes. Given her initial presentation with capital SCAD, I am inclined to get ahead and get a CTA of the coronaries.  Cath is really not so helpful, but a CT could give [place] some helpful information.  Reviewed in detail with the patient.   Addendum:  The request for CT has been denied by [employer] twice, and I spoke with the physician who said the case was closed and would require an appeal.   This is added for the appeals process.  I have left my name and number.   The patients original presentation was for SCAD.  IVUS documented a large RCA hematoma.  Urgent surgery was recommended.  A graft was placed to the RCA.  She has had some recurrent chest pain.  Since many of these lesions related to SCAD are non obstructive, they do not lend themselves well to stress testing.  Her original presentation occurred during running.  In addition, catheterization documents only luminal diameter, and her RCA lesion was not obstructive at that time.  [facility]US images are well documented at [facility].  The issues of SCAD have been widely reported in the past year.  CTA would be helpful in documenting vessel appearance, and graft patency  (which may not be present now with healing of the tear.  The indication for surgery over PCI was due to the potential for hematoma advancement distally, and the indication for surgery was a patent distal vessel in a large caliber territory, with the potential for limited options should hematoma expansion progress.  See [email or web address] for more information.

## 2011-06-25 NOTE — Assessment & Plan Note (Signed)
Patient had SCAD.  Current symptoms are somewhat different.  Would best be assessed with CTA to look at graft patency  (which potentially could no longer be patent) and assessing her remaining anatomy.

## 2011-07-02 ENCOUNTER — Telehealth: Payer: Self-pay | Admitting: Cardiology

## 2011-07-02 NOTE — Telephone Encounter (Signed)
Dr Riley Kill called and spoke with Dr Power.  Appeals department called with authorization for [name]'s CTA.

## 2011-07-02 NOTE — Telephone Encounter (Signed)
Dr Riley Kill needs to call [phone] press option #2 and give pt ID [id number].

## 2011-07-02 NOTE — Telephone Encounter (Signed)
New problem:  Please call the number that you would general use for pre-authorization. Need to discuss CTA.

## 2011-07-03 ENCOUNTER — Encounter: Payer: Self-pay | Admitting: *Deleted

## 2011-07-07 ENCOUNTER — Ambulatory Visit (HOSPITAL_COMMUNITY)
Admission: RE | Admit: 2011-07-07 | Discharge: 2011-07-07 | Disposition: A | Discharge status: Home / Self Care | Payer: BC Managed Care – PPO | Source: Ambulatory Visit | Attending: Cardiology | Admitting: Cardiology

## 2011-07-07 DIAGNOSIS — R079 Chest pain, unspecified: Secondary | ICD-10-CM | POA: Insufficient documentation

## 2011-07-07 DIAGNOSIS — Z951 Presence of aortocoronary bypass graft: Secondary | ICD-10-CM | POA: Insufficient documentation

## 2011-07-07 MED ORDER — METOPROLOL TARTRATE 1 MG/ML IV SOLN
5.0000 mg | INTRAVENOUS | Status: AC | PRN
Start: 2011-07-07 — End: 2011-07-07
  Administered 2011-07-07 (×3): 5 mg via INTRAVENOUS

## 2011-07-07 MED ORDER — NITROGLYCERIN 0.4 MG SL SUBL
0.4000 mg | SUBLINGUAL_TABLET | Freq: Once | SUBLINGUAL | Status: AC
  Administered 2011-07-07: 0.4 mg via SUBLINGUAL
  Filled 2011-07-07: qty 25

## 2011-07-07 MED ORDER — IOHEXOL 350 MG/ML SOLN
80.0000 mL | Freq: Once | INTRAVENOUS | Status: AC | PRN
  Administered 2011-07-07: 80 mL via INTRAVENOUS

## 2011-07-07 MED ORDER — METOPROLOL TARTRATE 1 MG/ML IV SOLN
INTRAVENOUS | Status: AC
  Filled 2011-07-07: qty 15

## 2011-07-07 MED ORDER — NITROGLYCERIN 0.4 MG SL SUBL
SUBLINGUAL_TABLET | SUBLINGUAL | Status: AC
  Filled 2011-07-07: qty 50

## 2011-07-07 MED ORDER — METOPROLOL TARTRATE 1 MG/ML IV SOLN
INTRAVENOUS | Status: AC
  Filled 2011-07-07: qty 5

## 2011-07-10 ENCOUNTER — Telehealth: Payer: Self-pay | Admitting: Cardiology

## 2011-07-10 NOTE — Telephone Encounter (Signed)
Pt notified of ct results

## 2011-07-10 NOTE — Telephone Encounter (Signed)
Fu call °Patient returning your call °

## 2011-07-15 ENCOUNTER — Ambulatory Visit (INDEPENDENT_AMBULATORY_CARE_PROVIDER_SITE_OTHER): Payer: BC Managed Care – PPO | Admitting: Cardiology

## 2011-07-15 ENCOUNTER — Encounter: Payer: Self-pay | Admitting: Cardiology

## 2011-07-15 VITALS — BP 124/80 | HR 72 | Ht 68.0 in | Wt 219.0 lb

## 2011-07-15 DIAGNOSIS — R945 Abnormal results of liver function studies: Secondary | ICD-10-CM

## 2011-07-15 DIAGNOSIS — R7989 Other specified abnormal findings of blood chemistry: Secondary | ICD-10-CM

## 2011-07-15 DIAGNOSIS — I2510 Atherosclerotic heart disease of native coronary artery without angina pectoris: Secondary | ICD-10-CM

## 2011-07-15 DIAGNOSIS — R079 Chest pain, unspecified: Secondary | ICD-10-CM

## 2011-07-15 NOTE — Progress Notes (Signed)
HPI:  The patient returns in followup today. We reviewed in great detail the results of her CT scan. She continues to have some upper chest discomfort. It is described as a upper left chest pressure which really does not go away much. She denies any dyspnea, shortness of breath, or relationship to exertion. In reviewing the CAT scan carefully, the results are noted in epic.  We also reviewed the studies ourselves.    Current Outpatient Prescriptions  Medication Sig Dispense Refill  . acetaminophen (TYLENOL) 325 MG tablet Take 325 mg by mouth every 6 (six) hours as needed. Pain        . aspirin 81 MG tablet Take 81 mg by mouth every morning.       . diphenhydrAMINE (BENADRYL) 25 mg capsule Take 25 mg by mouth at bedtime.       Marland Kitchen levonorgestrel (MIRENA) 20 MCG/24HR IUD 1 each by Intrauterine route once.        Marland Kitchen levothyroxine (SYNTHROID, LEVOTHROID) 150 MCG tablet Take 150 mcg by mouth every morning.       . metoprolol tartrate (LOPRESSOR) 25 MG tablet Take 1/2 tablet twice a day  30 tablet  11  . omeprazole (PRILOSEC) 20 MG capsule Take 20 mg by mouth daily.         No Known Allergies  Past Medical History  Diagnosis Date  . Hypothyroid   . Overweight   . CAD (coronary artery disease)     open heart surgery 09/12/10  . History of thyroid cancer   . GERD (gastroesophageal reflux disease)     Past Surgical History  Procedure Date  . Thyroidectomy   . Coronary artery bypass grafting x1 09/13/10    [name]  . Arm surgery     rt    Family History  Problem Relation Age of Onset  . Hypertension Mother   . Hypertension Father   . Hypertension Sister   . Coronary artery disease Maternal Grandfather   . Heart disease Maternal Aunt   . Lupus Maternal Aunt   . Diabetes Maternal Aunt   . Colon polyps Mother   . Hiatal hernia Father   . Colon cancer Neg Hx     History   Social History  . Marital Status: Married    Spouse Name: N/A    Number of Children: 2  . 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 Smoking status: Never Smoker   . Smokeless tobacco: Never Used  . Alcohol Use: Yes     rarely  . Drug Use: No  . Sexually Active: Yes    Birth Control/ Protection: IUD   Other Topics Concern  . Not on file   Social History Narrative   Regular exercise-yesCaffeine Use-yes    ROS: Please see the HPI.  All other systems reviewed and negative.  PHYSICAL EXAM:  BP 124/80  Pulse 72  Ht 5\' 8"  (1.727 m)  Wt 219 lb (99.338 kg)  BMI 33.30 kg/m2  General: Well developed, well nourished, in no acute distress. Head:  Normocephalic and atraumatic. Neck: no JVD Lungs: Clear to auscultation and percussion. Heart: Normal S1 and S2.  No murmur, rubs or gallops.  Abdomen:  Normal bowel sounds; soft; non tender; no organomegaly Pulses: Pulses normal in all 4 extremities. Extremities: No clubbing or cyanosis. No edema. Neurologic: Alert and oriented x 3.  EKG  CT SCAN  OVER-READ INTERPRETATION - CT CHEST  The following report is an over-read performed by radiologist Dr. Karn Cassis. Reche Dixon, M.D. of [facility], [place] on 07/08/2011 08:28:01. This over-read does not include interpretation of cardiac or coronary anatomy or pathology. The CTA interpretation by the cardiologist is attached.  Comparison: Plain film chest of 10/09/2010. No prior chest CTs.  Findings: Lung windows demonstrate no nodules or airspace opacities.  Soft tissue windows demonstrate normal imaged thoracic aorta without dissection. No pericardial or pleural effusion. Prior median sternotomy No mediastinal or hilar adenopathy. Limited evaluation for pulmonary embolism, secondary bolus timing.  Limited abdominal imaging demonstrates no significant findings. No acute osseous abnormality.  IMPRESSION: No extracardiac findings within the imaged chest.  **END ADDENDUM** SIGNED BY: Karn Cassis. Reche Dixon, M.D.   Signed  by Consuello Bossier, MD on 07/08/2011 8:37 AM   **ADDENDUM** CREATED: 07/07/2011 14:59:29  [facility] to read noncardiac findings.  **END ADDENDUM** SIGNED BY: Laurey Morale   Signed by Laurey Morale, MD on 07/07/2011 3:01 PM         Narrative         CARDIAC CTA WITH CALCIUM SCORE 07/07/2011 13:17:00  Ordering Physician: Lysbeth Galas  Reading Physician: DaltonS.Mclean  Protocol: The patient scanned on a Philips 256 slice scanner. Prospective gating with 5% phase tolerance at 120 kV was used. A total of 15 mg of IV Lopressor was administered. Average heart rate during the scan was 65 beats per minute. After an initial AP and lateral topogram, 3 mm axial slices were performed through the heart for calcium scoring. The patient then had a 80 ml bolus of contrast given for coronary CTA with bolus tracking in the descending thoracic aorta. The 3-D data set was then sent to the [employer]. Reconstructions were done using MIP,MPR and VRT modes.  Indications: Chest pain, h/o spontaneous RCA dissection  DETAILED FINDINGS:  Quality of Study: Good  Left Main: No plaque or stenosis  Left Anterior Descending: No plaque or stenosis  Left Circumflex: Large OM1, small AV LCX beyond OM1. No plaque or stenosis.  Right Coronary Artery: Dominant vessel. No plaque or stenosis. There did not appear to be any significant perivascular thickening suggestive of hematoma (prior proximal to mid RCA dissection). There was a patent saphenous vein graft to the distal RCA with no significant disease.  Coronary Calcium Score: 0 Agatston units  IMPRESSION: 1. No plaque or stenosis in the coronaries with coronary artery calcium score = 0 Agatston units. From the stand point of traditional risk factors, this conveys low risk of future cardiac events.  2. Prior RCA dissection with SVG-distal RCA. The bypass graft is patent without significant disease. I do not see  significant perivascular/intramural thickening around the RCA and it appears widely patent.  Original Report Authenticated By: Consuello Bossier, M.D.        Last Resulted: 07/07/11 2:02 PM       ASSESSMENT AND PLAN:

## 2011-07-15 NOTE — Patient Instructions (Signed)
Your physician recommends that you schedule a follow-up appointment in: 2 MONTHS  Your physician recommends that you continue on your current medications as directed. Please refer to the Current Medication list given to you today.   

## 2011-07-15 NOTE — Assessment & Plan Note (Signed)
Chest pain occurs or left side of the sternum. It is not associated with dyspnea, nausea, or anything similar to what she experienced prior to spontaneous coronary artery dissection. Her CAT scan suggests that the vein graft as well as the native right coronary artery opened, and there is no obvious evidence of hematoma. The other vessels remain open as well. She and I discussed the treatment plan in detail. She feels comfortable with a watch and wait approach. The symptoms initially seemed to be worse with stress, although she notes them regularly but she says that they're not in any way disabling. It is possible to the symptoms that could be related to her sternotomy. There is no obvious sign of infection. She's not short of breath, and there were no obvious clinical findings on exam. We'll continue to follow her up closely in the office. She knows that should she have any problems she should contact us promptly.

## 2011-07-20 NOTE — Assessment & Plan Note (Signed)
See CT scan results

## 2011-07-20 NOTE — Assessment & Plan Note (Addendum)
Being followed by Dr. Russella Dar for hepatic function abnormality. She will be due in the near future for her repeat hepatic studies.

## 2011-07-25 ENCOUNTER — Ambulatory Visit (INDEPENDENT_AMBULATORY_CARE_PROVIDER_SITE_OTHER): Payer: BC Managed Care – PPO | Admitting: Family Medicine

## 2011-07-25 ENCOUNTER — Encounter: Payer: Self-pay | Admitting: Family Medicine

## 2011-07-25 VITALS — BP 126/80 | HR 64 | Temp 98.6°F | Ht 68.5 in | Wt 222.0 lb

## 2011-07-25 DIAGNOSIS — Z975 Presence of (intrauterine) contraceptive device: Secondary | ICD-10-CM | POA: Insufficient documentation

## 2011-07-25 DIAGNOSIS — C73 Malignant neoplasm of thyroid gland: Secondary | ICD-10-CM

## 2011-07-25 DIAGNOSIS — I2510 Atherosclerotic heart disease of native coronary artery without angina pectoris: Secondary | ICD-10-CM

## 2011-07-25 NOTE — Progress Notes (Signed)
  Subjective:    Patient ID: [name], female    DOB: May 27, 1970, 41 y.o.   MRN: [id number]  HPI New to establish.  Previous MD- Sigmund Hazel, [place].  Cards- [place].  EndoEverardo All  Thyroid Cancer- following w/ Endo Everardo All), was seeing [facility].  S/p radioactive I in 2006 and removal.  Denies heat/cold intolerance, changes to skin/hair/nails.  CAD- s/p single vessel bypass 8/12.  Following w/ Dr Riley Kill.  Hx of SCAD.  Continues to have CP, no SOB, edema.  Is continuing to see cards re: CP.  Reports this is currently stable.  IUD- due for replacement.  No local GYN.  Due for pap and mammo.   Review of Systems For ROS see HPI     Objective:   Physical Exam  Vitals reviewed. Constitutional: She is oriented to person, place, and time. She appears well-developed and well-nourished. No distress.  HENT:  Head: Normocephalic and atraumatic.  Eyes: Conjunctivae and EOM are normal. Pupils are equal, round, and reactive to light.  Neck: Normal range of motion. Neck supple. No thyromegaly present.  Cardiovascular: Normal rate, regular rhythm, normal heart sounds and intact distal pulses.   No murmur heard. Pulmonary/Chest: Effort normal and breath sounds normal. No respiratory distress.  Abdominal: Soft. She exhibits no distension. There is no tenderness.  Musculoskeletal: She exhibits no edema.  Lymphadenopathy:    She has no cervical adenopathy.  Neurological: She is alert and oriented to person, place, and time.  Skin: Skin is warm and dry.  Psychiatric: She has a normal mood and affect. Her behavior is normal.          Assessment & Plan:

## 2011-07-25 NOTE — Patient Instructions (Addendum)
Schedule your complete physical at your convenience We'll call you with your GYN appt Call with any questions or concerns Welcome!  We're glad to have you!!!

## 2011-07-27 NOTE — Assessment & Plan Note (Signed)
New to provider.  S/p surgery and radioactive Iodine tx.  Pt following w/ endo.  Currently asymptomatic.  Will follow along and assist as able.

## 2011-07-27 NOTE — Assessment & Plan Note (Addendum)
New to provider.  Pt w/ complicated hx (see synopsis above).  Following w/ Dr Riley Kill.  Continues to have CP but reports this is currently stable and is continuing to have w/u to attempt to determine cause.  Will follow along and assist as able.

## 2011-07-27 NOTE — Assessment & Plan Note (Signed)
New to provider.  Pt due for IUD removal and desires re-insertion.  Will refer to GYN for this.

## 2011-08-14 ENCOUNTER — Ambulatory Visit: Payer: BC Managed Care – PPO

## 2011-08-14 DIAGNOSIS — C73 Malignant neoplasm of thyroid gland: Secondary | ICD-10-CM

## 2011-08-14 LAB — HM PAP SMEAR: HM Pap smear: NORMAL

## 2011-08-18 LAB — THYROGLOBULIN ANTIBODY: Thyroglobulin Ab: <20 U/mL (ref <40.0)

## 2011-09-17 ENCOUNTER — Encounter: Payer: Self-pay | Admitting: Cardiology

## 2011-09-17 ENCOUNTER — Ambulatory Visit (INDEPENDENT_AMBULATORY_CARE_PROVIDER_SITE_OTHER): Payer: BC Managed Care – PPO | Admitting: Cardiology

## 2011-09-17 VITALS — BP 121/85 | HR 76 | Ht 68.5 in | Wt 221.8 lb

## 2011-09-17 DIAGNOSIS — I2510 Atherosclerotic heart disease of native coronary artery without angina pectoris: Secondary | ICD-10-CM

## 2011-09-17 NOTE — Patient Instructions (Signed)
Your physician wants you to follow-up in: 4 MONTHS.  You will receive a reminder letter in the mail two months in advance. If you don't receive a letter, please call our office to schedule the follow-up appointment.  Your physician recommends that you continue on your current medications as directed. Please refer to the Current Medication list given to you today.  

## 2011-09-17 NOTE — Assessment & Plan Note (Signed)
The patient has spontaneous coronary artery dissection. She is currently stable. She underwent emergent bypass for a very large right coronary artery which had mid vessel dissection. She is stabilized. Her followup CT scan does not reveal significant problems she will continue on the same current medical regimen I will see her back in followup in 4 months.

## 2011-09-17 NOTE — Progress Notes (Signed)
HPI:  Patient is seen in followup. From a cardiac standpoint she really is doing pretty well. She will not having a lot more trouble at the present time. She has some mild chest discomfort, but she has undergone CT angiography which demonstrates no she's gotten used to this, and overall is comfortableevidence of significant dissection that we can tell.  Current Outpatient Prescriptions  Medication Sig Dispense Refill  . acetaminophen (TYLENOL) 325 MG tablet Take 325 mg by mouth every 6 (six) hours as needed. Pain        . aspirin 81 MG tablet Take 81 mg by mouth every morning.       . diphenhydrAMINE (BENADRYL) 25 mg capsule Take 25 mg by mouth at bedtime.       Marland Kitchen levonorgestrel (MIRENA) 20 MCG/24HR IUD 1 each by Intrauterine route once.        Marland Kitchen levothyroxine (SYNTHROID, LEVOTHROID) 150 MCG tablet Take 150 mcg by mouth every morning.       . metoprolol tartrate (LOPRESSOR) 25 MG tablet Take 1/2 tablet twice a day  30 tablet  11  . omeprazole (PRILOSEC) 20 MG capsule Take 20 mg by mouth daily.         No Known Allergies  Past Medical History  Diagnosis Date  . Hypothyroid   . Overweight   . CAD (coronary artery disease)     open heart surgery 09/12/10  . History of thyroid cancer   . GERD (gastroesophageal reflux disease)   . Arthritis   . Cancer 2007    thyroid  . Heart disease     Past Surgical History  Procedure Date  . Thyroidectomy   . Coronary artery bypass grafting x1 09/13/10    [name]  . Arm surgery     rt    Family History  Problem Relation Age of Onset  . Hypertension Mother   . Colon polyps Mother   . Arthritis Mother   . Hypertension Father   . Hiatal hernia Father   . Hypertension Sister   . Coronary artery disease Maternal Grandfather   . Heart disease Maternal Aunt   . Lupus Maternal Aunt   . Diabetes Maternal Aunt   . Colon cancer Neg Hx   . Arthritis Paternal Grandmother     History   Social History  . Marital Status: Married    Spouse  Name: N/A    Number of Children: 2  . 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 Smoking status: Never Smoker   . Smokeless tobacco: Never Used  . Alcohol Use: Yes     rarely  . Drug Use: No  . Sexually Active: Yes    Birth Control/ Protection: IUD   Other Topics Concern  . Not on file   Social History Narrative   Regular exercise-yesCaffeine Use-yes    ROS: Please see the HPI.  All other systems reviewed and negative.  PHYSICAL EXAM:  BP 121/85  Pulse 76  Ht 5' 8.5" (1.74 m)  Wt 221 lb 12.8 oz (100.608 kg)  BMI 33.23 kg/m2  General: Well developed, well nourished, in no acute distress. Head:  Normocephalic and atraumatic. Neck: no JVD Lungs: Clear to auscultation and percussion. Heart: Normal S1 and S2.  No murmur, rubs or gallops.  Pulses: Pulses normal in all 4 extremities. Extremities: No clubbing or cyanosis. No edema. Neurologic: Alert and oriented x 3.  EKG:    ASSESSMENT AND PLAN:

## 2011-09-22 ENCOUNTER — Encounter: Payer: Self-pay | Admitting: Family Medicine

## 2011-09-22 ENCOUNTER — Ambulatory Visit (INDEPENDENT_AMBULATORY_CARE_PROVIDER_SITE_OTHER): Payer: BC Managed Care – PPO | Admitting: Family Medicine

## 2011-09-22 VITALS — BP 124/80 | HR 64 | Temp 98.2°F | Ht 67.5 in | Wt 222.6 lb

## 2011-09-22 DIAGNOSIS — Z0000 Encounter for general adult medical examination without abnormal findings: Secondary | ICD-10-CM

## 2011-09-22 LAB — BASIC METABOLIC PANEL
BUN: 10 mg/dL (ref 6–23)
CO2: 23 mEq/L (ref 19–32)
Calcium: 8.6 mg/dL (ref 8.4–10.5)
Chloride: 105 mEq/L (ref 96–112)
Creatinine, Ser: 0.8 mg/dL (ref 0.4–1.2)
GFR: 85.78 mL/min (ref >60.00)
Glucose, Bld: 84 mg/dL (ref 70–99)
Potassium: 3.5 mEq/L (ref 3.5–5.1)
Sodium: 137 mEq/L (ref 135–145)

## 2011-09-22 LAB — CBC WITH DIFFERENTIAL/PLATELET
Basophils Absolute: 0.1 10*3/uL (ref 0.0–0.1)
Basophils Relative: 1 % (ref 0.0–3.0)
Eosinophils Absolute: 0 10*3/uL (ref 0.0–0.7)
Eosinophils Relative: 0 % (ref 0.0–5.0)
HCT: 38.4 % (ref 36.0–46.0)
Hemoglobin: 12.9 g/dL (ref 12.0–15.0)
Lymphocytes Relative: 19.6 % (ref 12.0–46.0)
Lymphs Abs: 1.6 10*3/uL (ref 0.7–4.0)
MCHC: 33.7 g/dL (ref 30.0–36.0)
MCV: 86.1 fl (ref 78.0–100.0)
Monocytes Absolute: 0.5 10*3/uL (ref 0.1–1.0)
Monocytes Relative: 5.6 % (ref 3.0–12.0)
Neutro Abs: 6.1 10*3/uL (ref 1.4–7.7)
Neutrophils Relative %: 73.8 % (ref 43.0–77.0)
Platelets: 285 10*3/uL (ref 150.0–400.0)
RBC: 4.47 Mil/uL (ref 3.87–5.11)
RDW: 12.9 % (ref 11.5–14.6)
WBC: 8.3 10*3/uL (ref 4.5–10.5)

## 2011-09-22 LAB — LIPID PANEL
Cholesterol: 156 mg/dL (ref 0–200)
HDL: 38 mg/dL — ABNORMAL LOW (ref >39.00)
LDL Cholesterol: 85 mg/dL (ref 0–99)
Total CHOL/HDL Ratio: 4
Triglycerides: 164 mg/dL — ABNORMAL HIGH (ref 0.0–149.0)

## 2011-09-22 LAB — HEPATIC FUNCTION PANEL
Alkaline Phosphatase: 111 U/L (ref 39–117)
Bilirubin, Direct: 0.1 mg/dL (ref 0.0–0.3)

## 2011-09-22 MED ORDER — OMEPRAZOLE 20 MG PO CPDR: 20.0000 mg | DELAYED_RELEASE_CAPSULE | Freq: Every day | ORAL | Status: DC

## 2011-09-22 NOTE — Assessment & Plan Note (Signed)
New.  Pt's PE WNL.  Plans on getting mammo this fall.  UTD on pap.  Check labs.  Anticipatory guidance provided.

## 2011-09-22 NOTE — Progress Notes (Signed)
  Subjective:    Patient ID: [name], female    DOB: 25-Feb-1970, 41 y.o.   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  No concerns today.   Review of Systems Patient reports no vision/ hearing changes, adenopathy,fever, weight change,  persistant/recurrent hoarseness , swallowing issues, chest pain, palpitations, edema, persistant/recurrent cough, hemoptysis, dyspnea (rest/exertional/paroxysmal nocturnal), gastrointestinal bleeding (melena, rectal bleeding), abdominal pain, significant heartburn, bowel changes, GU symptoms (dysuria, hematuria, incontinence), Gyn symptoms (abnormal  bleeding, pain),  syncope, focal weakness, memory loss, numbness & tingling, skin/hair/nail changes, abnormal bruising or bleeding, anxiety, or depression.     Objective:   Physical Exam General Appearance:    Alert, cooperative, no distress, appears stated age  Head:    Normocephalic, without obvious abnormality, atraumatic  Eyes:    PERRL, conjunctiva/corneas clear, EOM's intact, fundi    benign, both eyes  Ears:    Normal TM's and external ear canals, both ears  Nose:   Nares normal, septum midline, mucosa normal, no drainage    or sinus tenderness  Throat:   Lips, mucosa, and tongue normal; teeth and gums normal  Neck:   Supple, symmetrical, trachea midline, no adenopathy;    Thyroid: no enlargement/tenderness/nodules  Back:     Symmetric, no curvature, ROM normal, no CVA tenderness  Lungs:     Clear to auscultation bilaterally, respirations unlabored  Chest Wall:    No tenderness or deformity   Heart:    Regular rate and rhythm, S1 and S2 normal, no murmur, rub   or gallop  Breast Exam:    Deferred to GYN  Abdomen:     Soft, non-tender, bowel sounds active all four quadrants,    no masses, no organomegaly  Genitalia:    Deferred to GYN  Rectal:    Extremities:   Extremities normal, atraumatic, no cyanosis or edema  Pulses:   2+  and symmetric all extremities  Skin:   Skin color, texture, turgor normal, no rashes or lesions  Lymph nodes:   Cervical, supraclavicular, and axillary nodes normal  Neurologic:   CNII-XII intact, normal strength, sensation and reflexes    throughout          Assessment & Plan:

## 2011-09-22 NOTE — Patient Instructions (Addendum)
You look great!  Keep up the good work! We'll notify you of your lab results Call with any questions or concerns Have a great fall!!!

## 2011-09-24 ENCOUNTER — Encounter: Payer: Self-pay | Admitting: *Deleted

## 2011-09-26 ENCOUNTER — Encounter: Payer: Self-pay | Admitting: *Deleted

## 2011-09-26 LAB — VITAMIN D 1,25 DIHYDROXY: Vitamin D 1, 25 (OH)2 Total: 32 pg/mL (ref 18–72)

## 2011-12-02 ENCOUNTER — Other Ambulatory Visit: Payer: Self-pay | Admitting: Family Medicine

## 2011-12-02 MED ORDER — LEVOTHYROXINE SODIUM 150 MCG PO TABS: 150.0000 ug | ORAL_TABLET | ORAL | Status: DC

## 2011-12-02 NOTE — Telephone Encounter (Signed)
Patient called stating that she need a refill of her levothyroxine (SYNTHROID, LEVOTHROID) 150 MCG tablet [[id number] Take 150 mcg by mouth every morning called into [id number] - [place], [place] - [address]. Please assist.

## 2011-12-02 NOTE — Telephone Encounter (Signed)
Refill levothyroxine to [facility]. Patient notified.

## 2011-12-10 ENCOUNTER — Encounter: Payer: Self-pay | Admitting: Family Medicine

## 2011-12-16 ENCOUNTER — Other Ambulatory Visit: Payer: Self-pay

## 2011-12-16 DIAGNOSIS — I2510 Atherosclerotic heart disease of native coronary artery without angina pectoris: Secondary | ICD-10-CM

## 2011-12-16 MED ORDER — METOPROLOL TARTRATE 25 MG PO TABS: ORAL_TABLET | ORAL | Status: DC

## 2012-02-02 ENCOUNTER — Other Ambulatory Visit: Payer: Self-pay

## 2012-02-03 ENCOUNTER — Other Ambulatory Visit: Payer: Self-pay | Admitting: Neurology

## 2012-02-03 ENCOUNTER — Other Ambulatory Visit: Payer: Self-pay | Admitting: Endocrinology

## 2012-02-03 MED ORDER — LEVOTHYROXINE SODIUM 150 MCG PO TABS: 150.0000 ug | ORAL_TABLET | ORAL | Status: DC

## 2012-02-20 ENCOUNTER — Encounter: Payer: Self-pay | Admitting: Cardiology

## 2012-02-20 ENCOUNTER — Ambulatory Visit (INDEPENDENT_AMBULATORY_CARE_PROVIDER_SITE_OTHER): Payer: BC Managed Care – PPO | Admitting: Cardiology

## 2012-02-20 VITALS — BP 120/90 | HR 61 | Ht 68.0 in | Wt 235.0 lb

## 2012-02-20 DIAGNOSIS — E663 Overweight: Secondary | ICD-10-CM

## 2012-02-20 DIAGNOSIS — I2510 Atherosclerotic heart disease of native coronary artery without angina pectoris: Secondary | ICD-10-CM

## 2012-02-20 NOTE — Patient Instructions (Addendum)
Your physician wants you to follow-up in: 6 MONTHS with Dr Cooper.  You will receive a reminder letter in the mail two months in advance. If you don't receive a letter, please call our office to schedule the follow-up appointment.  Your physician recommends that you continue on your current medications as directed. Please refer to the Current Medication list given to you today.  

## 2012-02-20 NOTE — Progress Notes (Signed)
HPI:  This lady continues to do well. She's not had any major new symptoms. She still has at times a little bit of mild left chest discomfort but she associates it closely on the back side of stress.  She maintains an excellent attitude and today is dressed in red.    Current Outpatient Prescriptions  Medication Sig Dispense Refill  . acetaminophen (TYLENOL) 325 MG tablet Take 325 mg by mouth every 6 (six) hours as needed. Pain        . aspirin 81 MG tablet Take 81 mg by mouth every morning.       . diphenhydrAMINE (BENADRYL) 25 mg capsule Take 25 mg by mouth at bedtime.       Marland Kitchen levonorgestrel (MIRENA) 20 MCG/24HR IUD 1 each by Intrauterine route once.        Marland Kitchen levothyroxine (SYNTHROID, LEVOTHROID) 150 MCG tablet Take 1 tablet (150 mcg total) by mouth every morning.  90 tablet  1  . metoprolol tartrate (LOPRESSOR) 25 MG tablet Take 1/2 tablet twice a day  30 tablet  6  . omeprazole (PRILOSEC) 20 MG capsule Take 1 capsule (20 mg total) by mouth daily.  90 capsule  3  . VIRASAL 27.5 % LIQD Use as directed        No Known Allergies  Past Medical History  Diagnosis Date  . Hypothyroid   . Overweight   . CAD (coronary artery disease)     open heart surgery 09/12/10  . History of thyroid cancer   . GERD (gastroesophageal reflux disease)   . Arthritis   . Cancer 2007    thyroid  . Heart disease     Past Surgical History  Procedure Date  . Thyroidectomy   . Coronary artery bypass grafting x1 09/13/10    [name]  . Arm surgery     rt    Family History  Problem Relation Age of Onset  . Hypertension Mother   . Colon polyps Mother   . Arthritis Mother   . Hypertension Father   . Hiatal hernia Father   . Hypertension Sister   . Coronary artery disease Maternal Grandfather   . Heart disease Maternal Aunt   . Lupus Maternal Aunt   . Diabetes Maternal Aunt   . Colon cancer Neg Hx   . Arthritis Paternal Grandmother     History   Social History  . Marital Status: Married      Spouse Name: N/A    Number of Children: 2  . 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 Smoking status: Never Smoker   . Smokeless tobacco: Never Used  . Alcohol Use: Yes     Comment: rarely  . Drug Use: No  . Sexually Active: Yes    Birth Control/ Protection: IUD   Other Topics Concern  . Not on file   Social History Narrative   Regular exercise-yesCaffeine Use-yes    ROS: Please see the HPI.  All other systems reviewed and negative.  PHYSICAL EXAM:  BP 120/90  Pulse 61  Ht 5\' 8"  (1.727 m)  Wt 235 lb (106.595 kg)  BMI 35.73 kg/m2  SpO2 99%  General: Well developed, well nourished, in no acute distress. Head:  Normocephalic and atraumatic. Neck: no JVD Lungs: Clear to auscultation and percussion. Heart: Normal S1 and S2.  No murmur, rubs or gallops.  Pulses: Pulses normal in all  4 extremities. Extremities: No clubbing or cyanosis. No edema. Neurologic: Alert and oriented x 3.  EKG:  NSR.  WNL.    ASSESSMENT AND PLAN:

## 2012-02-20 NOTE — Assessment & Plan Note (Addendum)
Patient has SCAD--therefore not on statins.  Had emergent CABG for spontaneous coronary dissection RCA with large distal vessel.  Continue medical therapy.  No statins.  No obvious evidence of FMD.

## 2012-02-26 NOTE — Assessment & Plan Note (Signed)
Encouraged to maintain a healthy lifestyle.

## 2012-08-18 ENCOUNTER — Other Ambulatory Visit: Payer: Self-pay | Admitting: Endocrinology

## 2012-09-14 IMAGING — CR DG CHEST 1V PORT
1 series · 1 of 1 positions shown · non-contrast
Comparison: 09/13/2010.

CLINICAL DATA: Status post CABG.

PORTABLE CHEST - 1 VIEW

[AP]
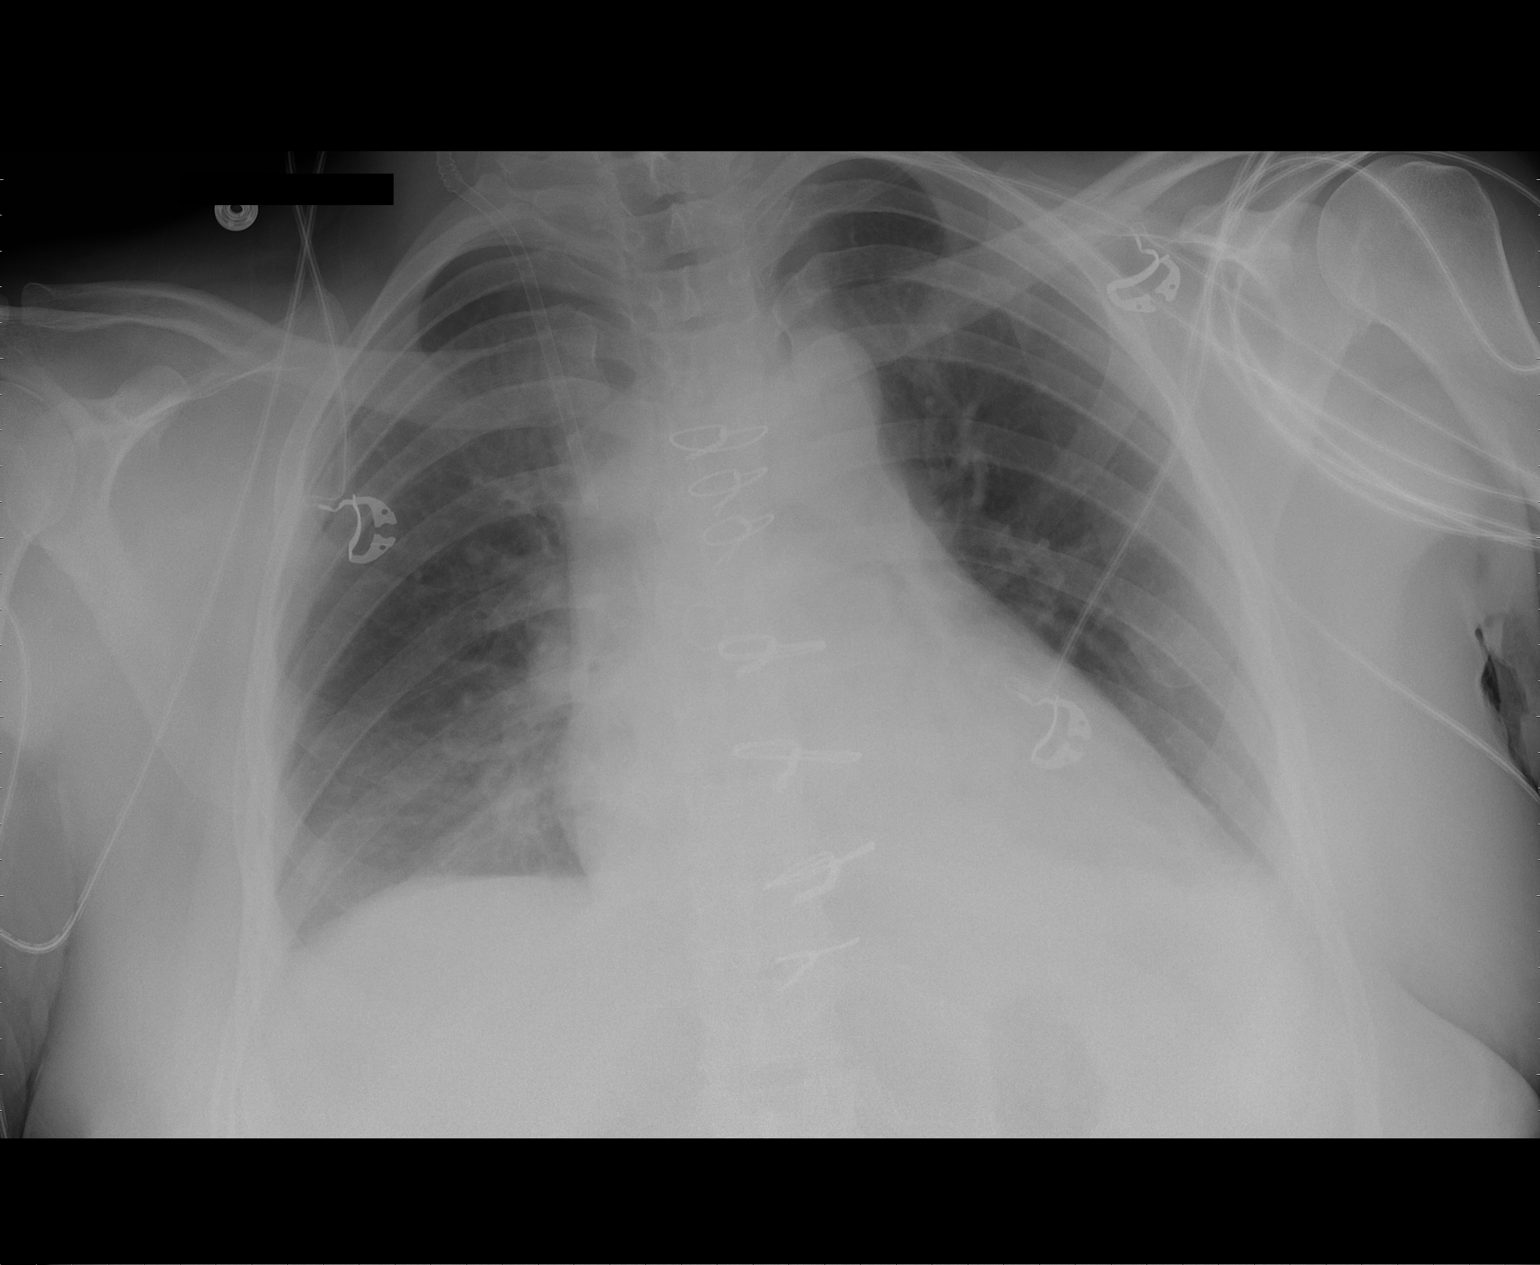

[1 of 1 positions shown; findings below may reference images not displayed]

FINDINGS: The mediastinal drain has been removed.  The Swan-Ganz
catheter has been removed.  The right IJ sheath remains.  Mild
cardiomegaly is present.  There is slight increase in bibasilar
airspace disease and effusions.  The upper lung fields are clear.
The lung volumes remain low.
IMPRESSION: 1.  Slight increase in small bilateral pleural effusions and
associated atelectasis airspace disease, likely atelectasis.
2.  Interval removal of the Swan-Ganz catheter and mediastinal
drain.  A right IJ sheath remains.

## 2012-09-25 ENCOUNTER — Other Ambulatory Visit: Payer: Self-pay | Admitting: Family Medicine

## 2012-09-27 NOTE — Telephone Encounter (Signed)
Medication refilled for 1 month. Pt needs an office visit. Last seen 09/22/2011. SW, CMA

## 2012-10-12 ENCOUNTER — Ambulatory Visit (INDEPENDENT_AMBULATORY_CARE_PROVIDER_SITE_OTHER): Payer: BC Managed Care – PPO | Admitting: Cardiovascular Disease

## 2012-10-12 ENCOUNTER — Encounter: Payer: Self-pay | Admitting: Cardiovascular Disease

## 2012-10-12 VITALS — BP 120/86 | HR 66 | Wt 229.0 lb

## 2012-10-12 DIAGNOSIS — I2510 Atherosclerotic heart disease of native coronary artery without angina pectoris: Secondary | ICD-10-CM

## 2012-10-12 DIAGNOSIS — R079 Chest pain, unspecified: Secondary | ICD-10-CM

## 2012-10-12 NOTE — Progress Notes (Signed)
HPI:  42 year old woman presenting for followup evaluation. She has been followed by Dr. Riley Kill. 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 She underwent single-vessel CABG at that time. She presents today for followup evaluation. Last lipids from September 2013 showed a cholesterol of 156, triglycerides 164, HDL 38, and LDL 85. Liver function tests were normal. Left ventricular function at the time of her event demonstrated hypokinesis of the inferior wall with an ejection fraction estimated at 50%.  The patient is doing fairly well. She works long hours at [employer] and has not been engaged in regular exercise. She has occasional chest pain but this is unrelated to exertion or physical activity. She's had this ever since her bypass surgery. It is on the left upper side of the chest and is self-limited. It is nonradiating. There is no shortness of breath, palpitations, or leg swelling.  Outpatient Encounter Prescriptions as of 10/12/2012  Medication Sig Dispense Refill  . aspirin 81 MG tablet Take 81 mg by mouth every morning.       . diphenhydrAMINE (BENADRYL) 25 mg capsule Take 25 mg by mouth at bedtime.       Marland Kitchen levonorgestrel (MIRENA) 20 MCG/24HR IUD 1 each by Intrauterine route once.        Marland Kitchen levothyroxine (SYNTHROID, LEVOTHROID) 150 MCG tablet TAKE 1 TABLET EVERY MORNING  30 tablet  0  . metoprolol tartrate (LOPRESSOR) 25 MG tablet Take 1/2 tablet twice a day  30 tablet  6  . omeprazole (PRILOSEC) 20 MG capsule TAKE 1 CAPSULE BY MOUTH DAILY  30 capsule  0  . VIRASAL 27.5 % LIQD Use as directed      . [DISCONTINUED] acetaminophen (TYLENOL) 325 MG tablet Take 325 mg by mouth every 6 (six) hours as needed. Pain        . [DISCONTINUED] levothyroxine (SYNTHROID, LEVOTHROID) 150 MCG tablet Take 1 tablet (150 mcg total) by mouth every morning.  90 tablet  1   No facility-administered encounter medications on file as of 10/12/2012.    No Known Allergies  Past Medical  History  Diagnosis Date  . Hypothyroid   . Overweight(278.02)   . CAD (coronary artery disease)     open heart surgery 09/12/10  . History of thyroid cancer   . GERD (gastroesophageal reflux disease)   . Arthritis   . Cancer 2007    thyroid  . Heart disease     ROS: Negative except as per HPI  BP 120/86  Pulse 66  Wt 103.874 kg (229 lb)  BMI 34.83 kg/m2  PHYSICAL EXAM: Pt is alert and oriented, NAD HEENT: normal Neck: JVP - normal, carotids 2+= without bruits Lungs: CTA bilaterally CV: RRR without murmur or gallop Abd: soft, NT, Positive BS, no hepatomegaly Ext: no C/C/E, distal pulses intact and equal Skin: warm/dry no rash  EKG:  Normal sinus rhythm 66 beats per minute, cannot rule out anterior infarct age undetermined.  ASSESSMENT AND PLAN: Coronary artery disease with history of spontaneous coronary artery dissection. Patient is status post single vessel CABG in 2012. I reviewed her gated cardiac CTA from last year which demonstrated complete healing of her RCA and continued patency of the saphenous vein graft to RCA. At the time of her initial event, she did have severe hypokinesis of the inferior wall with a left ventricular ejection fraction of 50%. I am going to order an echocardiogram to assess LV function to make sure that she hasn't had negative LV remodeling.  She would like to discontinue metoprolol and if her LV function has completely normalized will likely recommend that she can do this. Otherwise she can continue on aspirin 81 mg daily. There is no data for statin drugs in the spontaneous coronary dissection population. She was encouraged to initiate an exercise program. For followup I will see her back in one year.  Tonny Bollman 10/12/2012 9:24 AM

## 2012-10-12 NOTE — Patient Instructions (Addendum)
Your physician has requested that you have an echocardiogram. Echocardiography is a painless test that uses sound waves to create images of your heart. It provides your doctor with information about the size and shape of your heart and how well your heart's chambers and valves are working. This procedure takes approximately one hour. There are no restrictions for this procedure.  Your physician wants you to follow-up in: 1 YEAR with Dr Cooper. You will receive a reminder letter in the mail two months in advance. If you don't receive a letter, please call our office to schedule the follow-up appointment.  Your physician recommends that you continue on your current medications as directed. Please refer to the Current Medication list given to you today.  

## 2012-10-29 ENCOUNTER — Other Ambulatory Visit (HOSPITAL_COMMUNITY): Payer: Self-pay | Admitting: Cardiovascular Disease

## 2012-10-29 ENCOUNTER — Ambulatory Visit (HOSPITAL_COMMUNITY): Payer: BC Managed Care – PPO | Attending: Internal Medicine

## 2012-10-29 DIAGNOSIS — R079 Chest pain, unspecified: Secondary | ICD-10-CM

## 2012-10-29 DIAGNOSIS — I2510 Atherosclerotic heart disease of native coronary artery without angina pectoris: Secondary | ICD-10-CM

## 2012-10-29 NOTE — Progress Notes (Signed)
Echocardiogram performed.  

## 2012-11-01 ENCOUNTER — Encounter: Payer: Self-pay | Admitting: Endocrinology

## 2012-11-01 ENCOUNTER — Ambulatory Visit (INDEPENDENT_AMBULATORY_CARE_PROVIDER_SITE_OTHER): Payer: BC Managed Care – PPO | Admitting: Endocrinology

## 2012-11-01 VITALS — BP 132/86 | HR 98 | Temp 98.5°F | Wt 229.5 lb

## 2012-11-01 DIAGNOSIS — Z23 Encounter for immunization: Secondary | ICD-10-CM

## 2012-11-01 DIAGNOSIS — C73 Malignant neoplasm of thyroid gland: Secondary | ICD-10-CM

## 2012-11-01 DIAGNOSIS — E890 Postprocedural hypothyroidism: Secondary | ICD-10-CM

## 2012-11-01 MED ORDER — OMEPRAZOLE 20 MG PO CPDR: 20.0000 mg | DELAYED_RELEASE_CAPSULE | Freq: Every day | ORAL | Status: DC

## 2012-11-01 NOTE — Patient Instructions (Addendum)
blood tests are being requested for you today.  You will receive a letter with results. Please return in 1 year. pending the test results, please continue the same thyroid medication for now. please see dr Beverely Low soon for your annual checkup.

## 2012-11-01 NOTE — Progress Notes (Signed)
Subjective:    Patient ID: [name], female    DOB: [birth year], 42 y.o.   MRN: [id number]  HPI Pt had thyroidectomy at [facility] in 2006 for papillary adenocarcinoma of the thyroid.  pathol showed 2.7 cm right lobe tumor (T2 N0 M0).  In early 2007, she had adjuvant i-131 rx at [facility] ([facility]).  she had a nuc med scan a few years later, which pt says was negative.  She says her cancer blood test has been neg also.  She says synthroid was decreased, with the goal of a normal tsh, because of her long disease-free interval.  She does not notice any nodule at the neck.  Past Medical History  Diagnosis Date  . Hypothyroid   . Overweight(278.02)   . CAD (coronary artery disease)     open heart surgery 09/12/10  . History of thyroid cancer   . GERD (gastroesophageal reflux disease)   . Arthritis   . Cancer 2007    thyroid  . Heart disease     Past Surgical History  Procedure Laterality Date  . Thyroidectomy    . Coronary artery bypass grafting x1  09/13/10    [name]  . Arm surgery      rt    History   Social History  . Marital Status: Married    Spouse Name: N/A    Number of Children: 2  . 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 Smoking status: Never Smoker   . Smokeless tobacco: Never Used  . Alcohol Use: Yes     Comment: rarely  . Drug Use: No  . Sexual Activity: Yes    Birth Control/ Protection: IUD   Other Topics Concern  . Not on file   Social History Narrative   Regular exercise-yes   Caffeine Use-yes    Current Outpatient Prescriptions on File Prior to Visit  Medication Sig Dispense Refill  . aspirin 81 MG tablet Take 81 mg by mouth every morning.       . diphenhydrAMINE (BENADRYL) 25 mg capsule Take 25 mg by mouth at bedtime.       Marland Kitchen levonorgestrel (MIRENA) 20 MCG/24HR IUD 1 each by Intrauterine route once.        Marland Kitchen levothyroxine (SYNTHROID, LEVOTHROID) 150 MCG tablet TAKE 1  TABLET EVERY MORNING  30 tablet  0  . metoprolol tartrate (LOPRESSOR) 25 MG tablet Take 1/2 tablet twice a day  30 tablet  6  . VIRASAL 27.5 % LIQD Use as directed       No current facility-administered medications on file prior to visit.    No Known Allergies  Family History  Problem Relation Age of Onset  . Hypertension Mother   . Colon polyps Mother   . Arthritis Mother   . Hypertension Father   . Hiatal hernia Father   . Hypertension Sister   . Coronary artery disease Maternal Grandfather   . Heart disease Maternal Aunt   . Lupus Maternal Aunt   . Diabetes Maternal Aunt   . Colon cancer Neg Hx   . Arthritis Paternal Grandmother    BP 132/86  Pulse 98  Temp(Src) 98.5 F (36.9 C) (Oral)  Wt 229 lb 8 oz (104.101 kg)  BMI 34.9 kg/m2  SpO2 98%  Review of Systems Denies weight change and neck pain    Objective:   Physical Exam VITAL SIGNS:  See vs page GENERAL: no distress Neck: a healed scar is present.  i do not appreciate a nodule in the thyroid or elsewhere in the neck.    Lab Results  Component Value Date   TSH 1.70 11/01/2012   T4TOTAL 5.3 09/13/2010  TG is undetrectable    Assessment & Plan:  Stage-1 papillary adenocarcinoma of the thyroid.  No evidence of recurrence Postsurgical hypothyroidism.  In view of her long disease-free interval and low stage, normal tsh is appropriate.

## 2012-11-02 LAB — THYROGLOBULIN ANTIBODY: Thyroglobulin Ab: <20 U/mL (ref <40.0)

## 2012-11-02 LAB — THYROGLOBULIN LEVEL: Thyroglobulin: <0.2 ng/mL (ref 0.0–55.0)

## 2012-11-10 LAB — HM MAMMOGRAPHY: HM Mammogram: NORMAL

## 2012-11-18 ENCOUNTER — Other Ambulatory Visit: Payer: Self-pay | Admitting: Family Medicine

## 2012-12-13 ENCOUNTER — Other Ambulatory Visit: Payer: Self-pay

## 2012-12-13 MED ORDER — LEVOTHYROXINE SODIUM 150 MCG PO TABS: ORAL_TABLET | ORAL | Status: DC

## 2012-12-13 NOTE — Telephone Encounter (Signed)
Levothyroxine refilled at sent to [facility] pharmacy. /met

## 2013-01-14 ENCOUNTER — Other Ambulatory Visit: Payer: Self-pay | Admitting: Cardiology

## 2013-03-01 ENCOUNTER — Other Ambulatory Visit: Payer: Self-pay | Admitting: Cardiovascular Disease

## 2013-05-19 ENCOUNTER — Other Ambulatory Visit: Payer: Self-pay | Admitting: Endocrinology

## 2013-05-31 ENCOUNTER — Encounter: Payer: Self-pay | Admitting: Internal Medicine

## 2013-05-31 ENCOUNTER — Ambulatory Visit (INDEPENDENT_AMBULATORY_CARE_PROVIDER_SITE_OTHER): Payer: BC Managed Care – PPO | Admitting: Internal Medicine

## 2013-05-31 VITALS — BP 125/87 | HR 77 | Temp 98.7°F | Wt 236.0 lb

## 2013-05-31 DIAGNOSIS — J069 Acute upper respiratory infection, unspecified: Secondary | ICD-10-CM

## 2013-05-31 MED ORDER — OMEPRAZOLE 20 MG PO CPDR: 20.0000 mg | DELAYED_RELEASE_CAPSULE | Freq: Every day | ORAL | Status: DC

## 2013-05-31 MED ORDER — HYDROCODONE-HOMATROPINE 5-1.5 MG/5ML PO SYRP: 5.0000 mL | ORAL_SOLUTION | Freq: Every evening | ORAL | Status: DC | PRN

## 2013-05-31 MED ORDER — AMOXICILLIN 500 MG PO CAPS: 1000.0000 mg | ORAL_CAPSULE | Freq: Two times a day (BID) | ORAL | Status: DC

## 2013-05-31 NOTE — Progress Notes (Signed)
Pre-visit discussion using our clinic review tool. No additional management support is needed unless otherwise documented below in the visit note.  

## 2013-05-31 NOTE — Progress Notes (Signed)
Subjective:    Patient ID: [name], female    DOB: 09-01-1970, 43 y.o.   MRN: [id number]  DOS:  05/31/2013 Type of  visit: Acute visit, Symptoms started 5 days ago: Postnasal dripping, cough, raspy voice. Cough initially with clear sputum now is turning slightly greenish. Taking Mucinex, DayQui and NyQuil without much help.   ROS No fever or chills No  nausea, vomiting, diarrhea. No myalgias. Denies sinus pain or congestion. Denies chest pain, difficulty breathing or wheezing    Past Medical History  Diagnosis Date  . Hypothyroid   . Overweight   . CAD (coronary artery disease)     open heart surgery 09/12/10  . History of thyroid cancer   . GERD (gastroesophageal reflux disease)   . Arthritis   . Cancer 2007    thyroid  . Heart disease     Past Surgical History  Procedure Laterality Date  . Thyroidectomy    . Coronary artery bypass grafting x1  09/13/10    [place] [facility]  . Arm surgery      rt    History   Social History  . Marital Status: Married    Spouse Name: N/A    Number of Children: 2  . 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 Smoking status: Never Smoker   . Smokeless tobacco: Never Used  . Alcohol Use: Yes     Comment: rarely  . Drug Use: No  . Sexual Activity: Yes    Birth Control/ Protection: IUD   Other Topics Concern  . Not on file   Social History Narrative   Regular exercise-yes   Caffeine Use-yes        Medication List       This list is accurate as of: 05/31/13  7:05 PM.  Always use your most recent med list.               amoxicillin 500 MG capsule  Commonly known as:  AMOXIL  Take 2 capsules (1,000 mg total) by mouth 2 (two) times daily.     aspirin 81 MG tablet  Take 81 mg by mouth every morning.     diphenhydrAMINE 25 mg capsule  Commonly known as:  BENADRYL  Take 25 mg by mouth at bedtime.     HYDROcodone-homatropine 5-1.5 MG/5ML syrup   Commonly known as:  HYCODAN  Take 5 mLs by mouth at bedtime as needed for cough.     levonorgestrel 20 MCG/24HR IUD  Commonly known as:  MIRENA  1 each by Intrauterine route once.     levothyroxine 150 MCG tablet  Commonly known as:  SYNTHROID, LEVOTHROID  TAKE 1 TABLET BY MOUTH EVERY MORNING     metoprolol tartrate 25 MG tablet  Commonly known as:  LOPRESSOR  TAKE 1/2 TABLET BY MOUTH TWICE DAILY     omeprazole 20 MG capsule  Commonly known as:  PRILOSEC  Take 1 capsule (20 mg total) by mouth daily.           Objective:   Physical Exam BP 125/87  Pulse 77  Temp(Src) 98.7 F (37.1 C) (Oral)  Wt 236 lb (107.049 kg)  SpO2 93% General -- alert, well-developed, NAD.   HEENT-- Not pale. TMs normal, throat symmetric, no redness or discharge. Face symmetric, sinuses not tender to palpation. Nose congested. + hoarseness  Lungs -- normal respiratory effort, no intercostal retractions,  no accessory muscle use, and normal breath sounds.  Heart-- normal rate, regular rhythm, no murmur.   Neurologic--  alert & oriented X3. Speech normal, gait normal, strength normal in all extremities.    Psych-- Cognition and judgment appear intact. Cooperative with normal attention span and concentration. No anxious or depressed appearing.        Assessment & Plan:  URI, stop Mucinex D, try Mucinex DM. Antibiotics if not improving in few days. See instructions.  Request a refill on Prilosec, it was sent to her pharmacy, recommend to see PCP

## 2013-05-31 NOTE — Patient Instructions (Addendum)
Rest, fluids , tylenol For cough, take Mucinex DM twice a day as needed  If the cough is severe at night, use hydrocodone, will cause drowsiness  For congestion use OTC Nasocort: 2 nasal sprays on each side of the nose daily until you feel better   Take the antibiotic as prescribed  (Amoxicillin) if no better in few days  Call if no better by next week Call anytime if the symptoms are severe

## 2013-06-11 ENCOUNTER — Emergency Department (HOSPITAL_BASED_OUTPATIENT_CLINIC_OR_DEPARTMENT_OTHER)
Admission: EM | Admit: 2013-06-11 | Discharge: 2013-06-11 | Disposition: A | Discharge status: Home / Self Care | Payer: BC Managed Care – PPO | Attending: Emergency Medicine | Admitting: Emergency Medicine

## 2013-06-11 DIAGNOSIS — M129 Arthropathy, unspecified: Secondary | ICD-10-CM | POA: Insufficient documentation

## 2013-06-11 DIAGNOSIS — Z23 Encounter for immunization: Secondary | ICD-10-CM | POA: Insufficient documentation

## 2013-06-11 DIAGNOSIS — S0500XA Injury of conjunctiva and corneal abrasion without foreign body, unspecified eye, initial encounter: Secondary | ICD-10-CM

## 2013-06-11 DIAGNOSIS — E663 Overweight: Secondary | ICD-10-CM | POA: Insufficient documentation

## 2013-06-11 DIAGNOSIS — S058X9A Other injuries of unspecified eye and orbit, initial encounter: Secondary | ICD-10-CM | POA: Insufficient documentation

## 2013-06-11 DIAGNOSIS — I2510 Atherosclerotic heart disease of native coronary artery without angina pectoris: Secondary | ICD-10-CM | POA: Insufficient documentation

## 2013-06-11 DIAGNOSIS — X58XXXA Exposure to other specified factors, initial encounter: Secondary | ICD-10-CM | POA: Insufficient documentation

## 2013-06-11 DIAGNOSIS — Y929 Unspecified place or not applicable: Secondary | ICD-10-CM | POA: Insufficient documentation

## 2013-06-11 DIAGNOSIS — Y939 Activity, unspecified: Secondary | ICD-10-CM | POA: Insufficient documentation

## 2013-06-11 DIAGNOSIS — Z7982 Long term (current) use of aspirin: Secondary | ICD-10-CM | POA: Insufficient documentation

## 2013-06-11 DIAGNOSIS — Z79899 Other long term (current) drug therapy: Secondary | ICD-10-CM | POA: Insufficient documentation

## 2013-06-11 DIAGNOSIS — Z85850 Personal history of malignant neoplasm of thyroid: Secondary | ICD-10-CM | POA: Insufficient documentation

## 2013-06-11 DIAGNOSIS — Z951 Presence of aortocoronary bypass graft: Secondary | ICD-10-CM | POA: Insufficient documentation

## 2013-06-11 DIAGNOSIS — E039 Hypothyroidism, unspecified: Secondary | ICD-10-CM | POA: Insufficient documentation

## 2013-06-11 DIAGNOSIS — K219 Gastro-esophageal reflux disease without esophagitis: Secondary | ICD-10-CM | POA: Insufficient documentation

## 2013-06-11 MED ORDER — TETANUS-DIPHTH-ACELL PERTUSSIS 5-2.5-18.5 LF-MCG/0.5 IM SUSP
0.5000 mL | Freq: Once | INTRAMUSCULAR | Status: AC
  Administered 2013-06-11: 0.5 mL via INTRAMUSCULAR
  Filled 2013-06-11: qty 0.5

## 2013-06-11 MED ORDER — TOBRAMYCIN 0.3 % OP SOLN: 2.0000 [drp] | OPHTHALMIC | Status: DC

## 2013-06-11 MED ORDER — TETRACAINE HCL 0.5 % OP SOLN
OPHTHALMIC | Status: AC
  Administered 2013-06-11: 21:00:00
  Filled 2013-06-11: qty 2

## 2013-06-11 MED ORDER — HYDROCODONE-ACETAMINOPHEN 5-325 MG PO TABS: 2.0000 | ORAL_TABLET | ORAL | Status: DC | PRN

## 2013-06-11 MED ORDER — FLUORESCEIN SODIUM 1 MG OP STRP
ORAL_STRIP | OPHTHALMIC | Status: AC
  Filled 2013-06-11: qty 1

## 2013-06-11 NOTE — Discharge Instructions (Signed)
Corneal Abrasion  The cornea is the clear covering at the front and center of the eye. When looking at the colored portion of the eye (iris), you are looking through the cornea. This very thin tissue is made up of many layers. The surface layer is a single layer of cells (corneal epithelium) and is one of the most sensitive tissues in the body. If a scratch or injury causes the corneal epithelium to come off, it is called a corneal abrasion. If the injury extends to the tissues below the epithelium, the condition is called a corneal ulcer.  CAUSES    Scratches.   Trauma.   Foreign body in the eye.  Some people have recurrences of abrasions in the area of the original injury even after it has healed (recurrent erosion syndrome). Recurrent erosion syndrome generally improves and goes away with time.  SYMPTOMS    Eye pain.   Difficulty or inability to keep the injured eye open.   The eye becomes very sensitive to light.   Recurrent erosions tend to happen suddenly, first thing in the morning, usually after waking up and opening the eye.  DIAGNOSIS   Your health care provider can diagnose a corneal abrasion during an eye exam. Dye is usually placed in the eye using a drop or a small paper strip moistened by your tears. When the eye is examined with a special light, the abrasion shows up clearly because of the dye.  TREATMENT    Small abrasions may be treated with antibiotic drops or ointment alone.   Usually a pressure patch is specially applied. Pressure patches prevent the eye from blinking, allowing the corneal epithelium to heal. A pressure patch also reduces the amount of pain present in the eye during healing. Most corneal abrasions heal within 2 3 days with no effect on vision.  If the abrasion becomes infected and spreads to the deeper tissues of the cornea, a corneal ulcer can result. This is serious because it can cause corneal scarring. Corneal scars interfere with light passing through the cornea  and cause a loss of vision in the involved eye.  HOME CARE INSTRUCTIONS   Use medicine or ointment as directed. Only take over-the-counter or prescription medicines for pain, discomfort, or fever as directed by your health care provider.   Do not drive or operate machinery while your eye is patched. Your ability to judge distances is impaired.   If your health care provider has given you a follow-up appointment, it is very important to keep that appointment. Not keeping the appointment could result in a severe eye infection or permanent loss of vision. If there is any problem keeping the appointment, let your health care provider know.  SEEK MEDICAL CARE IF:    You have pain, light sensitivity, and a scratchy feeling in one eye or both eyes.   Your pressure patch keeps loosening up, and you can blink your eye under the patch after treatment.   Any kind of discharge develops from the eye after treatment or if the lids stick together in the morning.   You have the same symptoms in the morning as you did with the original abrasion days, weeks, or months after the abrasion healed.  MAKE SURE YOU:    Understand these instructions.   Will watch your condition.   Will get help right away if you are not doing well or get worse.  Document Released: 12/28/1999 Document Revised: 10/20/2012 Document Reviewed: 09/06/2012  [employer] Patient Information   2014 [employer].

## 2013-06-11 NOTE — ED Provider Notes (Signed)
CSN: [id number]     Arrival date & time 06/11/13  1912 History   First MD Initiated Contact with Patient 06/11/13 1946     Chief Complaint  Patient presents with  . Eye Pain     (Consider location/radiation/quality/duration/timing/severity/associated sxs/prior Treatment) Patient is a 43 y.o. female presenting with eye pain. The history is provided by the patient. No language interpreter was used.  Eye Pain This is a new problem. The current episode started today. The problem occurs constantly. The problem has been unchanged. Nothing aggravates the symptoms. She has tried nothing for the symptoms.    Past Medical History  Diagnosis Date  . Hypothyroid   . Overweight   . CAD (coronary artery disease)     open heart surgery 09/12/10  . History of thyroid cancer   . GERD (gastroesophageal reflux disease)   . Arthritis   . Cancer 2007    thyroid  . Heart disease    Past Surgical History  Procedure Laterality Date  . Thyroidectomy    . Coronary artery bypass grafting x1  09/13/10    [place] [facility]  . Arm surgery      rt   Family History  Problem Relation Age of Onset  . Hypertension Mother   . Colon polyps Mother   . Arthritis Mother   . Hypertension Father   . Hiatal hernia Father   . Hypertension Sister   . Coronary artery disease Maternal Grandfather   . Heart disease Maternal Aunt   . Lupus Maternal Aunt   . Diabetes Maternal Aunt   . Colon cancer Neg Hx   . Arthritis Paternal Grandmother    History  Substance Use Topics  . Smoking status: Never Smoker   . Smokeless tobacco: Never Used  . Alcohol Use: Yes     Comment: rarely   OB History   Grav Para Term Preterm Abortions TAB SAB Ect Mult Living                 Review of Systems  Eyes: Positive for pain.  All other systems reviewed and are negative.     Allergies  Review of patient's allergies indicates no known allergies.  Home Medications   Prior to Admission medications   Medication Sig Start Date  End Date Taking? Authorizing Provider  aspirin 81 MG tablet Take 81 mg by mouth every morning.     Historical Provider, MD  diphenhydrAMINE (BENADRYL) 25 mg capsule Take 25 mg by mouth at bedtime.     Historical Provider, MD  HYDROcodone-acetaminophen (NORCO/VICODIN) 5-325 MG per tablet Take 2 tablets by mouth every 4 (four) hours as needed. 06/11/13   Fransico Meadow, PA-C  levonorgestrel (MIRENA) 20 MCG/24HR IUD 1 each by Intrauterine route once.      Historical Provider, MD  levothyroxine (SYNTHROID, LEVOTHROID) 150 MCG tablet TAKE 1 TABLET BY MOUTH EVERY MORNING 05/19/13   Renato Shin, MD  metoprolol tartrate (LOPRESSOR) 25 MG tablet TAKE 1/2 TABLET BY MOUTH TWICE DAILY    Sherren Mocha, MD  omeprazole (PRILOSEC) 20 MG capsule Take 1 capsule (20 mg total) by mouth daily. 05/31/13   Colon Branch, MD  tobramycin (TOBREX) 0.3 % ophthalmic solution Place 2 drops into the right eye every 4 (four) hours. 06/11/13   Fransico Meadow, PA-C   BP 135/88  Pulse 77  Temp(Src) 98.2 F (36.8 C) (Oral)  Resp 18  Ht 5\' 8"  (1.727 m)  Wt 230 lb (104.327 kg)  BMI 34.98  kg/m2  SpO2 98% Physical Exam  Nursing note and vitals reviewed. Constitutional: She appears well-developed and well-nourished.  HENT:  Head: Normocephalic and atraumatic.  Eyes: Conjunctivae and EOM are normal. Pupils are equal, round, and reactive to light.  Injected right conjunctiva.  fluorescein shows moving foreign body's.   Small area of uptake 3:00.     Neurological: She is alert.  Skin: Skin is warm.  Psychiatric: She has a normal mood and affect.    ED Course  Procedures (including critical care time) Labs Review Labs Reviewed - No data to display  Imaging Review No results found.   EKG Interpretation None     saline irrigation right eye.  Eyelids swabbed,   Fluorescein repeated,  No foreign material,  Pt has small area of abrasion.   MDM   Final diagnoses:  Corneal abrasion   Pt placed on tobrex and hydrocodone.   Pt advised to see her Opthamologist for recheck on Monday if symptoms not completely resoved     Fransico Meadow, PA-C 06/11/13 [place], [place] 06/11/13 [id number]

## 2013-06-11 NOTE — ED Notes (Signed)
Pt reports getting a piece of wood in her eye today.  (R) eye red, draining. Reports blurry vision and pain

## 2013-06-12 NOTE — ED Provider Notes (Signed)
Medical screening examination/treatment/procedure(s) were performed by non-physician practitioner and as supervising physician I was immediately available for consultation/collaboration.   EKG Interpretation None       Ezequiel Essex, MD 06/12/13 [phone]

## 2013-06-15 ENCOUNTER — Other Ambulatory Visit: Payer: Self-pay | Admitting: Endocrinology

## 2013-07-14 ENCOUNTER — Other Ambulatory Visit: Payer: Self-pay | Admitting: Endocrinology

## 2013-07-29 ENCOUNTER — Ambulatory Visit (INDEPENDENT_AMBULATORY_CARE_PROVIDER_SITE_OTHER): Payer: BC Managed Care – PPO | Admitting: Family Medicine

## 2013-07-29 ENCOUNTER — Encounter: Payer: Self-pay | Admitting: Family Medicine

## 2013-07-29 ENCOUNTER — Other Ambulatory Visit: Payer: Self-pay | Admitting: General Practice

## 2013-07-29 VITALS — BP 110/72 | HR 80 | Temp 98.0°F | Resp 16 | Wt 236.1 lb

## 2013-07-29 DIAGNOSIS — R5383 Other fatigue: Secondary | ICD-10-CM

## 2013-07-29 DIAGNOSIS — R5381 Other malaise: Secondary | ICD-10-CM

## 2013-07-29 DIAGNOSIS — R0683 Snoring: Secondary | ICD-10-CM | POA: Insufficient documentation

## 2013-07-29 DIAGNOSIS — R0609 Other forms of dyspnea: Secondary | ICD-10-CM

## 2013-07-29 DIAGNOSIS — E890 Postprocedural hypothyroidism: Secondary | ICD-10-CM

## 2013-07-29 DIAGNOSIS — R0989 Other specified symptoms and signs involving the circulatory and respiratory systems: Secondary | ICD-10-CM

## 2013-07-29 LAB — CBC WITH DIFFERENTIAL/PLATELET
Basophils Absolute: 0 10*3/uL (ref 0.0–0.1)
Basophils Relative: 0.4 % (ref 0.0–3.0)
EOS PCT: 0.3 % (ref 0.0–5.0)
Eosinophils Absolute: 0 10*3/uL (ref 0.0–0.7)
HCT: 37.7 % (ref 36.0–46.0)
Hemoglobin: 12.8 g/dL (ref 12.0–15.0)
Lymphocytes Relative: 15.2 % (ref 12.0–46.0)
Lymphs Abs: 1.5 10*3/uL (ref 0.7–4.0)
MCHC: 33.8 g/dL (ref 30.0–36.0)
MCV: 81.7 fl (ref 78.0–100.0)
Monocytes Absolute: 0.5 10*3/uL (ref 0.1–1.0)
Monocytes Relative: 5.2 % (ref 3.0–12.0)
Neutro Abs: 7.5 10*3/uL (ref 1.4–7.7)
Neutrophils Relative %: 78.9 % — ABNORMAL HIGH (ref 43.0–77.0)
Platelets: 292 10*3/uL (ref 150.0–400.0)
RBC: 4.62 Mil/uL (ref 3.87–5.11)
RDW: 15 % (ref 11.5–15.5)
WBC: 9.6 10*3/uL (ref 4.0–10.5)

## 2013-07-29 LAB — BASIC METABOLIC PANEL
BUN: 11 mg/dL (ref 6–23)
CHLORIDE: 106 meq/L (ref 96–112)
CO2: 24 mEq/L (ref 19–32)
CREATININE: 0.9 mg/dL (ref 0.4–1.2)
Calcium: 9.3 mg/dL (ref 8.4–10.5)
GFR: 75.98 mL/min (ref >60.00)
Glucose, Bld: 82 mg/dL (ref 70–99)
Potassium: 3.6 mEq/L (ref 3.5–5.1)
Sodium: 137 mEq/L (ref 135–145)

## 2013-07-29 LAB — TSH: TSH: 1.87 u[IU]/mL (ref 0.35–4.50)

## 2013-07-29 MED ORDER — OMEPRAZOLE 20 MG PO CPDR: 20.0000 mg | DELAYED_RELEASE_CAPSULE | Freq: Every day | ORAL | Status: DC

## 2013-07-29 NOTE — Assessment & Plan Note (Signed)
New.  Could possibly be her thyroid but having no other hypothyroid sxs.  Must r/o anemia or other metabolic abnormality.  Will determine next steps based on lab results.  Pt expressed understanding and is in agreement w/ plan.

## 2013-07-29 NOTE — Patient Instructions (Signed)
Schedule your complete physical at your convenience We'll notify you of your lab results and make any changes if needed We'll call you with your Pulmonary appt to discuss the snoring and possible sleep apnea Call with any questions or concerns Hang in there!!!

## 2013-07-29 NOTE — Assessment & Plan Note (Signed)
New.  Pt is obese and husband is reporting snoring w/ periodic pauses.  Will refer to Pulmonary for complete evaluation prior to prescribing sleep aides.  Will follow.

## 2013-07-29 NOTE — Progress Notes (Signed)
Pre visit review using our clinic review tool, if applicable. No additional management support is needed unless otherwise documented below in the visit note. 

## 2013-07-29 NOTE — Assessment & Plan Note (Signed)
Chronic problem.  Pt doesn't want to f/u w/ Dr Loanne Drilling at this time and prefers me to assume management of this.  Check labs.  Adjust meds prn

## 2013-07-29 NOTE — Progress Notes (Signed)
   Subjective:    Patient ID: [name], female    DOB: 15-Mar-1970, 43 y.o.   MRN: [id number]  HPI Fatigue- pt reports 2 months of excessive fatigue.  Falling asleep at desk.  Gaining weight.  No constipation.  No changes to skin/hair/nails.  Not sleeping well at night.  Snoring very loudly 'like a freight train'.  Husband does note breathing pauses.    GERD- pt is requesting refill b/c 'i'm tired of paying OTC prices'   Review of Systems For ROS see HPI     Objective:   Physical Exam  Vitals reviewed. Constitutional: She is oriented to person, place, and time. She appears well-developed and well-nourished. No distress.  HENT:  Head: Normocephalic and atraumatic.  Eyes: Conjunctivae and EOM are normal. Pupils are equal, round, and reactive to light.  Neck: Normal range of motion. Neck supple. No thyromegaly present.  Cardiovascular: Normal rate, regular rhythm, normal heart sounds and intact distal pulses.   No murmur heard. Pulmonary/Chest: Effort normal and breath sounds normal. No respiratory distress.  Abdominal: Soft. She exhibits no distension. There is no tenderness.  Musculoskeletal: She exhibits no edema.  Lymphadenopathy:    She has no cervical adenopathy.  Neurological: She is alert and oriented to person, place, and time.  Skin: Skin is warm and dry.  Psychiatric: She has a normal mood and affect. Her behavior is normal.          Assessment & Plan:

## 2013-08-04 ENCOUNTER — Encounter: Payer: Self-pay | Admitting: Family Medicine

## 2013-08-04 ENCOUNTER — Ambulatory Visit (INDEPENDENT_AMBULATORY_CARE_PROVIDER_SITE_OTHER): Payer: BC Managed Care – PPO | Admitting: Family Medicine

## 2013-08-04 VITALS — BP 122/80 | HR 76 | Temp 98.0°F | Resp 16 | Ht 67.5 in | Wt 236.1 lb

## 2013-08-04 DIAGNOSIS — Z0000 Encounter for general adult medical examination without abnormal findings: Secondary | ICD-10-CM | POA: Insufficient documentation

## 2013-08-04 NOTE — Progress Notes (Signed)
Pre visit review using our clinic review tool, if applicable. No additional management support is needed unless otherwise documented below in the visit note. 

## 2013-08-04 NOTE — Patient Instructions (Signed)
Schedule a fasting lab visit at your convenience Everything looks great! Try and make healthy food choices and get regular exercise Call with any questions or concerns Enjoy the rest of your summer!!!

## 2013-08-04 NOTE — Progress Notes (Signed)
   Subjective:    Patient ID: [name], female    DOB: [birth year], 43 y.o.   MRN: [id number]  HPI CPE- UTD on mammo.  Has GYN ([facility]).     Review of Systems Patient reports no vision/ hearing changes, adenopathy,fever, weight change,  persistant/recurrent hoarseness , swallowing issues, chest pain, palpitations, edema, persistant/recurrent cough, hemoptysis, dyspnea (rest/exertional/paroxysmal nocturnal), gastrointestinal bleeding (melena, rectal bleeding), abdominal pain, significant heartburn, bowel changes, GU symptoms (dysuria, hematuria, incontinence), Gyn symptoms (abnormal  bleeding, pain),  syncope, focal weakness, memory loss, numbness & tingling, skin/hair/nail changes, abnormal bruising or bleeding, anxiety, or depression.     Objective:   Physical Exam General Appearance:    Alert, cooperative, no distress, appears stated age  Head:    Normocephalic, without obvious abnormality, atraumatic  Eyes:    PERRL, conjunctiva/corneas clear, EOM's intact, fundi    benign, both eyes  Ears:    Normal TM's and external ear canals, both ears  Nose:   Nares normal, septum midline, mucosa normal, no drainage    or sinus tenderness  Throat:   Lips, mucosa, and tongue normal; teeth and gums normal  Neck:   Supple, symmetrical, trachea midline, no adenopathy;    Thyroid: no enlargement/tenderness/nodules  Back:     Symmetric, no curvature, ROM normal, no CVA tenderness  Lungs:     Clear to auscultation bilaterally, respirations unlabored  Chest Wall:    No tenderness or deformity   Heart:    Regular rate and rhythm, S1 and S2 normal, no murmur, rub   or gallop  Breast Exam:    Deferred to GYN  Abdomen:     Soft, non-tender, bowel sounds active all four quadrants,    no masses, no organomegaly  Genitalia:    Deferred to GYN  Rectal:    Extremities:   Extremities normal, atraumatic, no cyanosis or edema  Pulses:   2+ and symmetric all extremities  Skin:   Skin color, texture,  turgor normal, no rashes or lesions  Lymph nodes:   Cervical, supraclavicular, and axillary nodes normal  Neurologic:   CNII-XII intact, normal strength, sensation and reflexes    throughout          Assessment & Plan:

## 2013-08-05 NOTE — Assessment & Plan Note (Signed)
Pt's PE WNL w/ exception of obesity.  UTD on GYN.  Pt to return for fasting lab visit.  Anticipatory guidance provided.

## 2013-08-08 ENCOUNTER — Other Ambulatory Visit: Payer: BC Managed Care – PPO

## 2013-08-09 ENCOUNTER — Other Ambulatory Visit: Payer: Self-pay | Admitting: Endocrinology

## 2013-09-06 ENCOUNTER — Other Ambulatory Visit: Payer: Self-pay | Admitting: Endocrinology

## 2013-09-06 ENCOUNTER — Other Ambulatory Visit: Payer: Self-pay | Admitting: Cardiovascular Disease

## 2013-09-13 ENCOUNTER — Encounter: Payer: Self-pay | Admitting: Pulmonary Disease

## 2013-09-13 ENCOUNTER — Ambulatory Visit (INDEPENDENT_AMBULATORY_CARE_PROVIDER_SITE_OTHER): Payer: BC Managed Care – PPO | Admitting: Pulmonary Disease

## 2013-09-13 VITALS — BP 118/72 | HR 78 | Temp 98.5°F | Ht 67.5 in | Wt 236.6 lb

## 2013-09-13 DIAGNOSIS — G4733 Obstructive sleep apnea (adult) (pediatric): Secondary | ICD-10-CM

## 2013-09-13 NOTE — Assessment & Plan Note (Signed)
The patient's history is very suggestive of clinically significant sleep apnea. I have had a long discussion with her about the pathophysiology of sleep apnea, including its impact to her quality of life and cardiovascular health. I think she needs to have a sleep study for diagnosis, and is an excellent candidate for home sleep testing. The patient is agreeable to this approach.

## 2013-09-13 NOTE — Progress Notes (Signed)
Subjective:    Patient ID: [name], female    DOB: Jun 30, 1970, 43 y.o.   MRN: [id number]  HPI The patient is a 43 year old female who I've been asked to see for possible obstructive sleep apnea. She has been noted to have loud snoring, as well as an abnormal breathing pattern during sleep. She also has occasional choking arousals that she blames on postnasal drip. She awakens it least 3-4 times a night, and never feels that she is in a deep level of sleep. She is not rested in the mornings upon arising, and describes significant inappropriate daytime sleepiness when at her desk and undisturbed. She stays extremely busy at night, and does not have issues with sleepiness while watching television. She has no sleepiness issues while driving long or short distances. Her weight has been stable over the last 2 years, and her Epworth score today is 9.   Sleep Questionnaire What time do you typically go to bed?( Between what hours) 9-10pm 9-10pm at 1457 on 09/13/13 by Lilli Few, CMA How long does it take you to fall asleep? 10 mins 10 mins at 1457 on 09/13/13 by Lilli Few, CMA How many times during the night do you wake up? 4 4 at 1457 on 09/13/13 by Lilli Few, CMA What time do you get out of bed to start your day? 0600 0600 at 1457 on 09/13/13 by Lilli Few, CMA Do you drive or operate heavy machinery in your occupation? No No at 1457 on 09/13/13 by Lilli Few, CMA How much has your weight changed (up or down) over the past two years? (In pounds) 0 oz (0 kg) 0 oz (0 kg) at 1457 on 09/13/13 by Lilli Few, CMA Have you ever had a sleep study before? No No at 1457 on 09/13/13 by Lilli Few, CMA Do you currently use CPAP? No No at 1457 on 09/13/13 by Lilli Few, CMA Do you wear oxygen at any time? No    Review of Systems  Constitutional: Negative for fever and unexpected weight change.  HENT: Negative  for congestion, dental problem, ear pain, nosebleeds, postnasal drip, rhinorrhea, sinus pressure, sneezing, sore throat and trouble swallowing.   Eyes: Negative for redness and itching.  Respiratory: Negative for cough, chest tightness, shortness of breath and wheezing.   Cardiovascular: Negative for palpitations and leg swelling.  Gastrointestinal: Negative for nausea and vomiting.  Genitourinary: Negative for dysuria.  Musculoskeletal: Negative for joint swelling.  Skin: Negative for rash.  Neurological: Positive for headaches.  Hematological: Does not bruise/bleed easily.  Psychiatric/Behavioral: Negative for dysphoric mood. The patient is not nervous/anxious.        Objective:   Physical Exam Constitutional:  Overweight female, no acute distress  HENT:  Nares patent without discharge  Oropharynx without exudate, palate and uvula are moderately elongated.  Eyes:  Perrla, eomi, no scleral icterus  Neck:  No JVD, no TMG  Cardiovascular:  Normal rate, regular rhythm, no rubs or gallops.  No murmurs        Intact distal pulses  Pulmonary :  Normal breath sounds, no stridor or respiratory distress   No rales, rhonchi, or wheezing  Abdominal:  Soft, nondistended, bowel sounds present.  No tenderness noted.   Musculoskeletal:  No lower extremity edema noted.  Lymph Nodes:  No cervical lymphadenopathy noted  Skin:  No cyanosis noted  Neurologic:  Alert, appropriate, moves all 4 extremities without obvious deficit.  Assessment & Plan:

## 2013-09-13 NOTE — Patient Instructions (Signed)
Will schedule for home sleep testing, and arrange followup once the results are available. Work on weight reduction

## 2013-11-10 DIAGNOSIS — G4730 Sleep apnea, unspecified: Secondary | ICD-10-CM

## 2013-11-20 ENCOUNTER — Other Ambulatory Visit: Payer: Self-pay | Admitting: Endocrinology

## 2013-11-21 NOTE — Telephone Encounter (Signed)
Please advise if ok to refill pt has not been seen since 2013.  Thanks!

## 2013-11-21 NOTE — Telephone Encounter (Signed)
Rx sent to pharmacy   

## 2013-11-21 NOTE — Telephone Encounter (Signed)
Please refill x 1 Ov is due  

## 2013-11-22 ENCOUNTER — Encounter: Payer: Self-pay | Admitting: Endocrinology

## 2013-11-22 ENCOUNTER — Telehealth: Payer: Self-pay | Admitting: Pulmonary Disease

## 2013-11-22 ENCOUNTER — Ambulatory Visit (INDEPENDENT_AMBULATORY_CARE_PROVIDER_SITE_OTHER): Payer: BC Managed Care – PPO | Admitting: Endocrinology

## 2013-11-22 ENCOUNTER — Encounter: Payer: Self-pay | Admitting: Pulmonary Disease

## 2013-11-22 VITALS — BP 114/73 | HR 80 | Temp 97.9°F | Ht 67.5 in | Wt 234.0 lb

## 2013-11-22 DIAGNOSIS — G4730 Sleep apnea, unspecified: Secondary | ICD-10-CM

## 2013-11-22 DIAGNOSIS — E890 Postprocedural hypothyroidism: Secondary | ICD-10-CM

## 2013-11-22 DIAGNOSIS — C73 Malignant neoplasm of thyroid gland: Secondary | ICD-10-CM

## 2013-11-22 LAB — TSH: TSH: 1.12 u[IU]/mL (ref 0.35–4.50)

## 2013-11-22 NOTE — Telephone Encounter (Signed)
[email or web address] x1 

## 2013-11-22 NOTE — Patient Instructions (Signed)
blood tests are being requested for you today.  We'll contact you with results.   Please return in 1 year.   pending the test results, please continue the same thyroid medication for now.

## 2013-11-22 NOTE — Progress Notes (Signed)
Subjective:    Patient ID: [name], female    DOB: 01/02/1971, 43 y.o.   MRN: [id number]  HPI  Pt returns for f/u of papillary adenocarcinoma of the thyroid 2006: thyroidectomy at [facility]; pathol showed 2.7 cm right lobe tumor (T2 N0 M0).   2007: adjuvant i-131 rx at [facility] ([facility]); pt says post-therapy scan was negative.   2007-2014: pt says her "cancer blood test" has been neg also.   10/14: TG undetectable (ab neg) Interval history: She does not notice any nodule at the neck.    She says synthroid was decreased, with the goal of a normal tsh, because of her long disease-free interval.  pt states she feels well in general.   Past Medical History  Diagnosis Date  . Hypothyroid   . Overweight(278.02)   . CAD (coronary artery disease)     open heart surgery 09/12/10  . History of thyroid cancer   . GERD (gastroesophageal reflux disease)   . Arthritis   . Cancer 2007    thyroid  . Heart disease     Past Surgical History  Procedure Laterality Date  . Thyroidectomy    . Coronary artery bypass grafting x1  09/13/10    [place] Gum  . Arm surgery      rt    History   Social History  . Marital Status: Married    Spouse Name: N/A    Number of Children: 2  . 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 Smoking status: Never Smoker   . Smokeless tobacco: Never Used  . Alcohol Use: Yes     Comment: rarely  . Drug Use: No  . Sexual Activity: Yes    Birth Control/ Protection: IUD   Other Topics Concern  . Not on file   Social History Narrative   Regular exercise-yes   Caffeine Use-yes    Current Outpatient Prescriptions on File Prior to Visit  Medication Sig Dispense Refill  . aspirin 81 MG tablet Take 81 mg by mouth every morning.     . diphenhydrAMINE (BENADRYL) 25 mg capsule Take 25 mg by mouth at bedtime.     Marland Kitchen levonorgestrel (MIRENA) 20 MCG/24HR IUD 1 each by Intrauterine route  once.      Marland Kitchen levothyroxine (SYNTHROID, LEVOTHROID) 150 MCG tablet TAKE 1 TABLET BY MOUTH EVERY MORNING 30 tablet 0  . metoprolol tartrate (LOPRESSOR) 25 MG tablet TAKE 1/2 TABLET BY MOUTH TWICE DAILY 30 tablet 3  . omeprazole (PRILOSEC) 20 MG capsule Take 1 capsule (20 mg total) by mouth daily. 30 capsule 6   No current facility-administered medications on file prior to visit.    No Known Allergies  Family History  Problem Relation Age of Onset  . Hypertension Mother   . Colon polyps Mother   . Arthritis Mother   . Hypertension Father   . Hiatal hernia Father   . Hypertension Sister   . Coronary artery disease Maternal Grandfather   . Heart disease Maternal Aunt   . Lupus Maternal Aunt   . Diabetes Maternal Aunt   . Colon cancer Neg Hx   . Arthritis Paternal Grandmother     BP 114/73 mmHg  Pulse 80  Temp(Src) 97.9 F (36.6 C) (Oral)  Ht 5' 7.5" (1.715 m)  Wt 234 lb (106.142 kg)  BMI 36.09 kg/m2  SpO2 97%  Review of Systems Denies neck  pain and edema    Objective:   Physical Exam VITAL SIGNS:  See vs page. GENERAL: no distress. Neck: a healed scar is present.  i do not appreciate a nodule in the thyroid or elsewhere in the neck.    Lab Results  Component Value Date   TSH 1.12 11/22/2013   T4TOTAL 5.3 09/13/2010   TG undetectable    Assessment & Plan:  Thyroid cancer, no evidence of recurrence Postsurgical hypothyroidism, well-replaced.add  Patient is advised the following: Patient Instructions  blood tests are being requested for you today.  We'll contact you with results.   Please return in 1 year.   pending the test results, please continue the same thyroid medication for now.

## 2013-11-22 NOTE — Telephone Encounter (Signed)
Called pt. appt scheduled for 11/23/13 at 2:30

## 2013-11-22 NOTE — Telephone Encounter (Signed)
[place]

## 2013-11-22 NOTE — Telephone Encounter (Signed)
Pt needs ov to review her sleep study 

## 2013-11-23 ENCOUNTER — Encounter: Payer: Self-pay | Admitting: Pulmonary Disease

## 2013-11-23 ENCOUNTER — Ambulatory Visit (INDEPENDENT_AMBULATORY_CARE_PROVIDER_SITE_OTHER): Payer: BC Managed Care – PPO | Admitting: Pulmonary Disease

## 2013-11-23 VITALS — BP 124/76 | HR 82 | Temp 98.4°F | Ht 67.5 in | Wt 239.2 lb

## 2013-11-23 DIAGNOSIS — G4733 Obstructive sleep apnea (adult) (pediatric): Secondary | ICD-10-CM

## 2013-11-23 LAB — THYROGLOBULIN ANTIBODY: THYROGLOBULIN AB: 1 [IU]/mL (ref <2)

## 2013-11-23 LAB — THYROGLOBULIN LEVEL: Thyroglobulin: <0.1 ng/mL — ABNORMAL LOW (ref 2.8–40.9)

## 2013-11-23 NOTE — Assessment & Plan Note (Signed)
The patient has mild obstructive sleep apnea by her recent home sleep test, and I have reviewed the various treatment options with her. Given the mild nature of her disease, this is really not a significant health risk for her. She can therefore decide about a more conservative treatment with a trial of weight loss alone, versus more aggressive treatment with sleep apnea or a dental appliance. I have outlined all of this with her, and she would like to take some time to consider all of these options.

## 2013-11-23 NOTE — Patient Instructions (Signed)
Think about the different options we discussed for treatment of your mild sleep apnea.  You can work on weight loss over the next 57mos or consider more aggressive treatment with dental appliance or cpap.

## 2013-11-23 NOTE — Progress Notes (Signed)
   Subjective:    Patient ID: [name], female    DOB: 23-May-1970, 43 y.o.   MRN: [id number]  HPI The patient comes in today for follow-up of her recent home sleep test. She was found to have mild OSA, with an AHI of 10 events per hour. I have reviewed the results with her in detail, and answered all of her questions.   Review of Systems  Constitutional: Negative for fever and unexpected weight change.  HENT: Negative for congestion, dental problem, ear pain, nosebleeds, postnasal drip, rhinorrhea, sinus pressure, sneezing, sore throat and trouble swallowing.   Eyes: Negative for redness and itching.  Respiratory: Negative for cough, chest tightness, shortness of breath and wheezing.   Cardiovascular: Negative for palpitations and leg swelling.  Gastrointestinal: Negative for nausea and vomiting.  Genitourinary: Negative for dysuria.  Musculoskeletal: Negative for joint swelling.  Skin: Negative for rash.  Neurological: Negative for headaches.  Hematological: Does not bruise/bleed easily.  Psychiatric/Behavioral: Negative for dysphoric mood. The patient is not nervous/anxious.        Objective:   Physical Exam Obese female in no acute distress Nose without purulence or discharge noted Neck without lymphadenopathy or thyromegaly Lower extremities without edema, no cyanosis Alert and oriented, moves all 4 extremities.       Assessment & Plan:

## 2013-11-28 ENCOUNTER — Ambulatory Visit (INDEPENDENT_AMBULATORY_CARE_PROVIDER_SITE_OTHER): Payer: BC Managed Care – PPO | Admitting: Cardiovascular Disease

## 2013-11-28 ENCOUNTER — Encounter: Payer: Self-pay | Admitting: Cardiovascular Disease

## 2013-11-28 VITALS — BP 118/88 | HR 68 | Ht 68.0 in | Wt 233.4 lb

## 2013-11-28 DIAGNOSIS — I25810 Atherosclerosis of coronary artery bypass graft(s) without angina pectoris: Secondary | ICD-10-CM

## 2013-11-28 DIAGNOSIS — I2510 Atherosclerotic heart disease of native coronary artery without angina pectoris: Secondary | ICD-10-CM

## 2013-11-28 NOTE — Progress Notes (Signed)
Background: the patient is followed for coronary artery disease. 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 She underwent single-vessel CABG at that time. After surgery, she had a gated cardiac CTA demonstrating complete healing of the native right coronary artery and continued patency of the saphenous vein graft RCA. Her LVEF was initially 50% with hypokinesis of the inferior wall. Follow-up echocardiogram demonstrated normalization of LV function with an ejection fraction of 55-60%.  HPI:  43 year old woman presenting for follow-up evaluation. She denies any cardiac related problems at the present time. She specifically denies chest pain, chest pressure, or shortness of breath. She's had no heart palpitations. She does have a torn meniscus in her knee and has been limited by this. She does not follow a prudent diet.  Studies:  2-D echocardiogram 10/29/2012: Study Conclusions  - Left ventricle: The cavity size was normal. Wall thickness was increased in a pattern of mild LVH. Systolic function was normal. The estimated ejection fraction was in the range of 55% to 60%. Wall motion was normal; there were no regional wall motion abnormalities. Doppler parameters are consistent with abnormal left ventricular relaxation (grade 1 diastolic dysfunction). - Aortic valve: There was no stenosis. - Mitral valve: No significant regurgitation. - Left atrium: The atrium was mildly dilated. - Right ventricle: The cavity size was normal. Systolic function was normal. - Right atrium: The atrium was mildly dilated. - Tricuspid valve: Peak RV-RA gradient: 49mm Hg (S). - Pulmonary arteries: PA peak pressure: 57mm Hg (S). - Inferior vena cava: The vessel was normal in size; the respirophasic diameter changes were in the normal range (= 50%); findings are consistent with normal central venous pressure. Impressions:  - Normal LV size with mild LV hypertrophy. EF  55-60%. Normal RV size and systolic function. Mild biatrial enlargement.   Outpatient Encounter Prescriptions as of 11/28/2013  Medication Sig  . aspirin 81 MG tablet Take 81 mg by mouth every morning.   . diphenhydrAMINE (BENADRYL) 25 mg capsule Take 25 mg by mouth at bedtime.   Marland Kitchen levonorgestrel (MIRENA) 20 MCG/24HR IUD 1 each by Intrauterine route once.    Marland Kitchen levothyroxine (SYNTHROID, LEVOTHROID) 150 MCG tablet TAKE 1 TABLET BY MOUTH EVERY MORNING  . meloxicam (MOBIC) 15 MG tablet   . metoprolol tartrate (LOPRESSOR) 25 MG tablet TAKE 1/2 TABLET BY MOUTH TWICE DAILY  . omeprazole (PRILOSEC) 20 MG capsule Take 1 capsule (20 mg total) by mouth daily.    No Known Allergies  Past Medical History  Diagnosis Date  . Hypothyroid   . Overweight(278.02)   . CAD (coronary artery disease)     open heart surgery 09/12/10  . History of thyroid cancer   . GERD (gastroesophageal reflux disease)   . Arthritis   . Cancer 2007    thyroid  . Heart disease     family history includes Arthritis in her mother and paternal grandmother; Colon polyps in her mother; Coronary artery disease in her maternal grandfather; Diabetes in her maternal aunt; Heart disease in her maternal aunt; Hiatal hernia in her father; Hypertension in her father, mother, and sister; Lupus in her maternal aunt. There is no history of Colon cancer.    BP 118/88 mmHg  Pulse 68  Ht 5\' 8"  (1.727 m)  Wt 233 lb 6.4 oz (105.87 kg)  BMI 35.50 kg/m2  PHYSICAL EXAM: Pt is alert and oriented, pleasant overweight woman in NAD HEENT: normal Neck: JVP - normal, carotids 2+= without bruits Lungs: CTA bilaterally  CV: RRR without murmur or gallop Abd: soft, NT, Positive BS, no hepatomegaly Ext: no C/C/E, distal pulses intact and equal Skin: warm/dry no rash  EKG:  Normal sinus rhythm 68 bpm, within normal limits.  ASSESSMENT AND PLAN: 1. Coronary artery disease, native vessel, without symptoms of angina. The patient is stable  from a cardiac perspective. She is only taking metoprolol a few days per week. Her LV function has normalized. I think she can stop this. She was encouraged to stay on aspirin 81 mg daily. There is very little evidence base in patients with 'Spontaneous Coronary Artery Dissection (SCAD) for drugs such as statins or beta-blockers. Imaging studies reviewed as outlined above.   2. Hyperlipidemia: pt reports LFT's have been elevated in past. She's had a liver bx. We discussed consideration of a statin but favor lifestyle modification. Recommend increase exercise and she will work on this.   Sherren Mocha, MD 11/28/2013 3:45 PM

## 2013-11-28 NOTE — Patient Instructions (Addendum)
Your physician wants you to follow-up in: 1 YEAR with Dr Burt Knack.  You will receive a reminder letter in the mail two months in advance. If you don't receive a letter, please call our office to schedule the follow-up appointment.  Your physician has recommended you make the following change in your medication: STOP Metoprolol Tartrate

## 2013-12-17 ENCOUNTER — Other Ambulatory Visit: Payer: Self-pay | Admitting: Endocrinology

## 2013-12-17 ENCOUNTER — Other Ambulatory Visit: Payer: Self-pay | Admitting: Cardiovascular Disease

## 2014-01-12 ENCOUNTER — Other Ambulatory Visit: Payer: Self-pay | Admitting: Endocrinology

## 2014-02-12 ENCOUNTER — Emergency Department (HOSPITAL_BASED_OUTPATIENT_CLINIC_OR_DEPARTMENT_OTHER): Payer: BLUE CROSS/BLUE SHIELD

## 2014-02-12 ENCOUNTER — Emergency Department (HOSPITAL_BASED_OUTPATIENT_CLINIC_OR_DEPARTMENT_OTHER)
Admission: EM | Admit: 2014-02-12 | Discharge: 2014-02-12 | Disposition: A | Discharge status: Home / Self Care | Payer: BLUE CROSS/BLUE SHIELD | Attending: Emergency Medicine | Admitting: Emergency Medicine

## 2014-02-12 ENCOUNTER — Encounter (HOSPITAL_BASED_OUTPATIENT_CLINIC_OR_DEPARTMENT_OTHER): Payer: Self-pay | Admitting: *Deleted

## 2014-02-12 DIAGNOSIS — E039 Hypothyroidism, unspecified: Secondary | ICD-10-CM | POA: Diagnosis not present

## 2014-02-12 DIAGNOSIS — E663 Overweight: Secondary | ICD-10-CM | POA: Diagnosis not present

## 2014-02-12 DIAGNOSIS — I2510 Atherosclerotic heart disease of native coronary artery without angina pectoris: Secondary | ICD-10-CM | POA: Insufficient documentation

## 2014-02-12 DIAGNOSIS — Z85850 Personal history of malignant neoplasm of thyroid: Secondary | ICD-10-CM | POA: Insufficient documentation

## 2014-02-12 DIAGNOSIS — Z7982 Long term (current) use of aspirin: Secondary | ICD-10-CM | POA: Insufficient documentation

## 2014-02-12 DIAGNOSIS — R51 Headache: Secondary | ICD-10-CM | POA: Diagnosis not present

## 2014-02-12 DIAGNOSIS — Z8679 Personal history of other diseases of the circulatory system: Secondary | ICD-10-CM | POA: Insufficient documentation

## 2014-02-12 DIAGNOSIS — M1990 Unspecified osteoarthritis, unspecified site: Secondary | ICD-10-CM | POA: Insufficient documentation

## 2014-02-12 DIAGNOSIS — R519 Headache, unspecified: Secondary | ICD-10-CM

## 2014-02-12 DIAGNOSIS — K219 Gastro-esophageal reflux disease without esophagitis: Secondary | ICD-10-CM | POA: Insufficient documentation

## 2014-02-12 DIAGNOSIS — Z79899 Other long term (current) drug therapy: Secondary | ICD-10-CM | POA: Diagnosis not present

## 2014-02-12 DIAGNOSIS — Z791 Long term (current) use of non-steroidal anti-inflammatories (NSAID): Secondary | ICD-10-CM | POA: Insufficient documentation

## 2014-02-12 LAB — BASIC METABOLIC PANEL
ANION GAP: 3 — AB (ref 5–15)
BUN: 10 mg/dL (ref 6–23)
CALCIUM: 9.1 mg/dL (ref 8.4–10.5)
CHLORIDE: 108 mmol/L (ref 96–112)
CO2: 24 mmol/L (ref 19–32)
Creatinine, Ser: 0.85 mg/dL (ref 0.50–1.10)
GFR calc Af Amer: >90 mL/min (ref >90)
GFR calc non Af Amer: 82 mL/min — ABNORMAL LOW (ref >90)
GLUCOSE: 100 mg/dL — AB (ref 70–99)
POTASSIUM: 3.5 mmol/L (ref 3.5–5.1)
SODIUM: 135 mmol/L (ref 135–145)

## 2014-02-12 LAB — CBC WITH DIFFERENTIAL/PLATELET
BASOS PCT: 1 % (ref 0–1)
Basophils Absolute: 0.1 10*3/uL (ref 0.0–0.1)
EOS ABS: 0 10*3/uL (ref 0.0–0.7)
EOS PCT: 0 % (ref 0–5)
HCT: 39.2 % (ref 36.0–46.0)
Hemoglobin: 13.5 g/dL (ref 12.0–15.0)
LYMPHS PCT: 15 % (ref 12–46)
Lymphs Abs: 1.5 10*3/uL (ref 0.7–4.0)
MCH: 28.7 pg (ref 26.0–34.0)
MCHC: 34.4 g/dL (ref 30.0–36.0)
MCV: 83.2 fL (ref 78.0–100.0)
MONO ABS: 0.6 10*3/uL (ref 0.1–1.0)
Monocytes Relative: 6 % (ref 3–12)
Neutro Abs: 7.6 10*3/uL (ref 1.7–7.7)
Neutrophils Relative %: 78 % — ABNORMAL HIGH (ref 43–77)
Platelets: 293 10*3/uL (ref 150–400)
RBC: 4.71 MIL/uL (ref 3.87–5.11)
RDW: 12.7 % (ref 11.5–15.5)
WBC: 9.7 10*3/uL (ref 4.0–10.5)

## 2014-02-12 MED ORDER — BUTALBITAL-APAP-CAFFEINE 50-325-40 MG PO TABS
1.0000 | ORAL_TABLET | Freq: Four times a day (QID) | ORAL | Status: DC | PRN
Start: 2014-02-12 — End: 2014-02-21

## 2014-02-12 MED ORDER — MAGNESIUM SULFATE 50 % IJ SOLN
INTRAMUSCULAR | Status: AC
Start: 2014-02-12 — End: 2014-02-12
  Administered 2014-02-12: 2 g via INTRAVENOUS
  Filled 2014-02-12: qty 2

## 2014-02-12 MED ORDER — SODIUM CHLORIDE 0.9 % IV SOLN
INTRAVENOUS | Status: DC
  Administered 2014-02-12: 07:00:00 via INTRAVENOUS

## 2014-02-12 MED ORDER — METOCLOPRAMIDE HCL 5 MG/ML IJ SOLN
10.0000 mg | Freq: Once | INTRAMUSCULAR | Status: AC
  Administered 2014-02-12: 10 mg via INTRAVENOUS
  Filled 2014-02-12: qty 2

## 2014-02-12 MED ORDER — KETOROLAC TROMETHAMINE 15 MG/ML IJ SOLN: 15.0000 mg | Freq: Once | INTRAMUSCULAR | Status: AC

## 2014-02-12 MED ORDER — MAGNESIUM SULFATE 50 % IJ SOLN
INTRAMUSCULAR | Status: AC
  Filled 2014-02-12: qty 2

## 2014-02-12 MED ORDER — MAGNESIUM SULFATE 2 GM/50ML IV SOLN: 2.0000 g | Freq: Once | INTRAVENOUS | Status: DC

## 2014-02-12 MED ORDER — DIPHENHYDRAMINE HCL 50 MG/ML IJ SOLN
50.0000 mg | Freq: Once | INTRAMUSCULAR | Status: AC
  Administered 2014-02-12: 50 mg via INTRAVENOUS
  Filled 2014-02-12: qty 1

## 2014-02-12 MED ORDER — KETOROLAC TROMETHAMINE 30 MG/ML IJ SOLN
INTRAMUSCULAR | Status: AC
  Administered 2014-02-12: 15 mg
  Filled 2014-02-12: qty 1

## 2014-02-12 NOTE — ED Notes (Addendum)
Patient states that she has had a headache since Wednesday. Today states her BP has been elevated as well. Pt took 650mg  aspirin at home this morning.

## 2014-02-12 NOTE — ED Notes (Signed)
m ag infused, NS infusing KVO on pump, IV site U, pt alert, NAD, calm, interactive, resps e/u, speaking in clear complete sentences, VSS, (denies: sx, questions or complaints), admits to some "nominal neck tightness, but feels better". Family at [facility]

## 2014-02-12 NOTE — ED Provider Notes (Signed)
CSN: [id number]     Arrival date & time 02/12/14  1617 History   First MD Initiated Contact with Patient 02/12/14 1645     Chief Complaint  Patient presents with  . Hypertension     (Consider location/radiation/quality/duration/timing/severity/associated sxs/prior Treatment) HPI   [name] is a 44 y.o. female complaining of headache onset approximately 5 days ago. Patient gets headaches often however this headache is slightly different because normally they are frontal in this time is occipital, it also radiates down the neck. She has associated nausea. Patient has been taking aspirin at home with little relief. She's also had leftover hydrocodone and using Biofreeze with some relief. She rates her pain at 6 out of 10, described as throbbing. Pt denies fever, rash, confusion,  LOC/syncope, change in vision, vomiting, numbness, weakness, dysarthria, ataxia, thunderclap onset, exacerbation with exertion or valsalva, exacerbation in morning, CP, SOB, abdominal pain.   Past Medical History  Diagnosis Date  . Hypothyroid   . Overweight(278.02)   . CAD (coronary artery disease)     open heart surgery 09/12/10  . History of thyroid cancer   . GERD (gastroesophageal reflux disease)   . Arthritis   . Cancer 2007    thyroid  . Heart disease    Past Surgical History  Procedure Laterality Date  . Thyroidectomy    . Coronary artery bypass grafting x1  09/13/10    [place] Gum  . Arm surgery      rt   Family History  Problem Relation Age of Onset  . Hypertension Mother   . Colon polyps Mother   . Arthritis Mother   . Hypertension Father   . Hiatal hernia Father   . Hypertension Sister   . Coronary artery disease Maternal Grandfather   . Heart disease Maternal Aunt   . Lupus Maternal Aunt   . Diabetes Maternal Aunt   . Colon cancer Neg Hx   . Arthritis Paternal Grandmother    History  Substance Use Topics  . Smoking status: Never Smoker   . Smokeless tobacco: Never Used   . Alcohol Use: Yes     Comment: rarely   OB History    No data available     Review of Systems  10 systems reviewed and found to be negative, except as noted in the HPI.  Allergies  Review of patient's allergies indicates no known allergies.  Home Medications   Prior to Admission medications   Medication Sig Start Date End Date Taking? Authorizing Provider  aspirin 81 MG tablet Take 81 mg by mouth every morning.     Historical Provider, MD  diphenhydrAMINE (BENADRYL) 25 mg capsule Take 25 mg by mouth at bedtime.     Historical Provider, MD  levonorgestrel (MIRENA) 20 MCG/24HR IUD 1 each by Intrauterine route once.      Historical Provider, MD  levothyroxine (SYNTHROID, LEVOTHROID) 150 MCG tablet TAKE 1 TABLET BY MOUTH EVERY MORNING 01/12/14   Renato Shin, MD  meloxicam [facility]) 15 MG tablet  11/26/13   Historical Provider, MD  omeprazole (PRILOSEC) 20 MG capsule Take 1 capsule (20 mg total) by mouth daily. 07/29/13   Midge Minium, MD   BP 148/93 mmHg  Pulse 94  Temp(Src) 98.1 F (36.7 C) (Oral)  Resp 18  Ht 5\' 7"  (1.702 m)  Wt 225 lb (102.059 kg)  BMI 35.23 kg/m2  SpO2 98% Physical Exam  Constitutional: She is oriented to person, place, and time. She appears well-developed and well-nourished.  No distress.  HENT:  Head: Normocephalic and atraumatic.  Mouth/Throat: Oropharynx is clear and moist.  Eyes: Conjunctivae and EOM are normal. Pupils are equal, round, and reactive to light.  Neck: Normal range of motion.  Cardiovascular: Normal rate, regular rhythm and intact distal pulses.   Pulmonary/Chest: Effort normal and breath sounds normal. No stridor. No respiratory distress. She has no wheezes. She has no rales. She exhibits no tenderness.  Abdominal: Soft. Bowel sounds are normal. She exhibits no distension and no mass. There is no tenderness. There is no rebound and no guarding.  Musculoskeletal: Normal range of motion.  Neurological: She is alert and oriented to  person, place, and time.  II-Visual fields grossly intact. III/IV/VI-Extraocular movements intact.  Pupils reactive bilaterally. V/VII-Smile symmetric, equal eyebrow raise,  facial sensation intact VIII- Hearing grossly intact IX/X-Normal gag XI-bilateral shoulder shrug XII-midline tongue extension Motor: 5/5 bilaterally with normal tone and bulk Cerebellar: Normal finger-to-nose  and normal heel-to-shin test.   Romberg negative Ambulates with a coordinated gait   Psychiatric: She has a normal mood and affect.  Nursing note and vitals reviewed.   ED Course  Procedures (including critical care time) Labs Review Labs Reviewed - No data to display  Imaging Review No results found.   EKG Interpretation None      MDM   Final diagnoses:  Acute nonintractable headache, unspecified headache type    Filed Vitals:   02/12/14 1624 02/12/14 1815 02/12/14 1916  BP: 148/93 112/75 115/76  Pulse: 94 76 81  Temp: 98.1 F (36.7 C)  98.1 F (36.7 C)  TempSrc: Oral  Oral  Resp: 18 20 16   Height: 5\' 7"  (1.702 m)    Weight: 225 lb (102.059 kg)    SpO2: 98% 99% 94%    Medications  metoCLOPramide (REGLAN) injection 10 mg (10 mg Intravenous Given 02/12/14 1752)  diphenhydrAMINE (BENADRYL) injection 50 mg (50 mg Intravenous Given 02/12/14 1746)  magnesium sulfate 50 % (IV Push/IM) injection (2 g Intravenous Given 02/12/14 1758)  magnesium sulfate 50 % (IV Push/IM) injection (  Duplicate 06/28/05 3710)  ketorolac (TORADOL) 15 MG/ML injection 15 mg (0 mg Intravenous Duplicate 07/08/92 8546)  ketorolac (TORADOL) 30 MG/ML injection (15 mg  Given 02/12/14 1851)    [name] is a pleasant 44 y.o. female presenting with headache which is slightly atypical for her. I doubt that this is subarachnoid, CVA, meningitis. Patient does have slightly atypical features, CT is negative. Patient's headache completely resolved after headache cocktail. I don't think a lumbar puncture is indicated at  this time.  Evaluation does not show pathology that would require ongoing emergent intervention or inpatient treatment. Pt is hemodynamically stable and mentating appropriately. Discussed findings and plan with patient/guardian, who agrees with care plan. All questions answered. Return precautions discussed and outpatient follow up given.   Discharge Medication List as of 02/12/2014  7:29 PM    START taking these medications   Details  butalbital-acetaminophen-caffeine (FIORICET) 50-325-40 MG per tablet Take 1 tablet by mouth every 6 (six) hours as needed for headache., Starting 02/12/2014, Until Discontinued, Print             Monico Blitz, PA-C 02/14/14 Garrett III, MD 02/16/14 1710

## 2014-02-12 NOTE — Discharge Instructions (Signed)
Please follow with your primary care doctor in the next 2 days for a check-up. They must obtain records for further management.  ° °Do not hesitate to return to the Emergency Department for any new, worsening or concerning symptoms.  ° °

## 2014-02-13 ENCOUNTER — Other Ambulatory Visit: Payer: Self-pay | Admitting: Family Medicine

## 2014-02-13 NOTE — Telephone Encounter (Signed)
Med filled.  

## 2014-02-17 ENCOUNTER — Ambulatory Visit (INDEPENDENT_AMBULATORY_CARE_PROVIDER_SITE_OTHER): Payer: BLUE CROSS/BLUE SHIELD | Admitting: Medical

## 2014-02-17 ENCOUNTER — Encounter: Payer: Self-pay | Admitting: Medical

## 2014-02-17 VITALS — BP 143/93 | HR 87 | Temp 98.3°F | Ht 68.0 in | Wt 232.6 lb

## 2014-02-17 DIAGNOSIS — G4489 Other headache syndrome: Secondary | ICD-10-CM

## 2014-02-17 DIAGNOSIS — R519 Headache, unspecified: Secondary | ICD-10-CM | POA: Insufficient documentation

## 2014-02-17 DIAGNOSIS — R51 Headache: Secondary | ICD-10-CM

## 2014-02-17 DIAGNOSIS — IMO0001 Reserved for inherently not codable concepts without codable children: Secondary | ICD-10-CM

## 2014-02-17 DIAGNOSIS — R030 Elevated blood-pressure reading, without diagnosis of hypertension: Secondary | ICD-10-CM

## 2014-02-17 MED ORDER — LISINOPRIL 5 MG PO TABS: 5.0000 mg | ORAL_TABLET | Freq: Every day | ORAL | Status: DC

## 2014-02-17 MED ORDER — CYCLOBENZAPRINE HCL 10 MG PO TABS: 10.0000 mg | ORAL_TABLET | Freq: Every day | ORAL | Status: DC

## 2014-02-17 MED ORDER — HYDROCODONE-ACETAMINOPHEN 5-325 MG PO TABS
1.0000 | ORAL_TABLET | Freq: Four times a day (QID) | ORAL | Status: DC | PRN
Start: 2014-02-17 — End: 2014-08-15

## 2014-02-17 MED ORDER — KETOROLAC TROMETHAMINE 60 MG/2ML IM SOLN
60.0000 mg | Freq: Once | INTRAMUSCULAR | Status: AC
  Administered 2014-02-17: 60 mg via INTRAMUSCULAR

## 2014-02-17 NOTE — Assessment & Plan Note (Signed)
rx lisinopril.

## 2014-02-17 NOTE — Patient Instructions (Signed)
Headache We gave you toradol for your ha. I want you to stop the fiorecet and use hydrodcone(short term use as want to avoid rebound ha) and flexeril at night.  You may have tension ha as your trapezius is tender on exam right now.  Your bp may also be cause of your ha as you are about 20 points above your normal baseline. I will put you on low dose lisinopril. Over next 4 days check your bp daiily 2-3 times a day. I want to know your bp range.  If your ha worsens with neurologic signs or symptoms then ED evaluation. Even though your ct was negative and your neurologic exam is negative if signs/symptoms worsen then MRI would be needed. If despite the above measures you have no  impromement then will refer you to neurologist.   Elevated BP rx lisinopril.    Follow up Tuesday or as needed

## 2014-02-17 NOTE — Progress Notes (Signed)
Pre visit review using our clinic review tool, if applicable. No additional management support is needed unless otherwise documented below in the visit note. 

## 2014-02-17 NOTE — Assessment & Plan Note (Signed)
We gave you toradol for your ha. I want you to stop the fiorecet and use hydrodcone(short term use as want to avoid rebound ha) and flexeril at night.  You may have tension ha as your trapezius is tender on exam right now.  Your bp may also be cause of your ha as you are about 20 points above your normal baseline. I will put you on low dose lisinopril. Over next 4 days check your bp daiily 2-3 times a day. I want to know your bp range.  If your ha worsens with neurologic signs or symptoms then ED evaluation. Even though your ct was negative and your neurologic exam is negative if signs/symptoms worsen then MRI would be needed. If despite the above measures you have no  impromement then will refer you to neurologist.

## 2014-02-17 NOTE — Progress Notes (Signed)
Subjective:    Patient ID: [name], female    DOB: 05-Feb-1970, 44 y.o.   MRN: [id number]  HPI   Pt has ha recently since this past Wednesday. Pt states pain dull in the morning and then worsens in the afternoon. Pt has seen optometrist and her vision was good. Also appointment in ED on the 31 st had negative Ct of the head. Pt has history of ha in the past. Pt was about to start treatment before she had open heart surgery 3 years ago. Pt saw neurologist after head trauma in 2005. No ha classified in the past. Pt has tried some fiorect just recently but this is not helping. Pt ha level is 6-7/10 level.She states pain eye brow area and aching back of her head.  Prior to past 10 days only 1 ha a week. Would respond to aspirin.  NO dizziness, constant nausea daily basis. No vomiting. NO gross motor or sensory function deficits.  LMP-2 weeks ago and has mirena.  Pt has no history of htn.  Review of Systems  Constitutional: Negative for fever, chills, diaphoresis, activity change and fatigue.  Respiratory: Negative for cough, chest tightness and shortness of breath.   Cardiovascular: Negative for chest pain, palpitations and leg swelling.  Gastrointestinal: Negative for nausea, vomiting and abdominal pain.  Musculoskeletal: Positive for neck pain. Negative for neck stiffness.       Pain near occicipatal region where trapezius inserts into cranium.  Neurological: Positive for headaches. Negative for dizziness, tremors, seizures, syncope, facial asymmetry, speech difficulty, weakness, light-headedness and numbness.  Psychiatric/Behavioral: Negative for behavioral problems, confusion and agitation. The patient is not nervous/anxious.     Past Medical History  Diagnosis Date  . Hypothyroid   . Overweight(278.02)   . CAD (coronary artery disease)     open heart surgery 09/12/10  . History of thyroid cancer   . GERD (gastroesophageal reflux disease)   . Arthritis   . Cancer 2007      thyroid  . Heart disease     History   Social History  . Marital Status: Married    Spouse Name: N/A    Number of Children: 2  . 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 Smoking status: Never Smoker   . Smokeless tobacco: Never Used  . Alcohol Use: Yes     Comment: rarely  . Drug Use: No  . Sexual Activity: Yes    Birth Control/ Protection: IUD   Other Topics Concern  . Not on file   Social History Narrative   Regular exercise-yes   Caffeine Use-yes    Past Surgical History  Procedure Laterality Date  . Thyroidectomy    . Coronary artery bypass grafting x1  09/13/10    [place] Gum  . Arm surgery      rt    Family History  Problem Relation Age of Onset  . Hypertension Mother   . Colon polyps Mother   . Arthritis Mother   . Hypertension Father   . Hiatal hernia Father   . Hypertension Sister   . Coronary artery disease Maternal Grandfather   . Heart disease Maternal Aunt   . Lupus Maternal Aunt   . Diabetes Maternal Aunt   . Colon cancer Neg Hx   . Arthritis Paternal Grandmother     No Known Allergies  Current Outpatient Prescriptions on File Prior to Visit  Medication Sig Dispense Refill  . aspirin 81 MG tablet Take 81 mg by mouth every morning.     . butalbital-acetaminophen-caffeine (FIORICET) 50-325-40 MG per tablet Take 1 tablet by mouth every 6 (six) hours as needed for headache. (Patient not taking: Reported on 02/17/2014) 15 tablet 0  . diphenhydrAMINE (BENADRYL) 25 mg capsule Take 25 mg by mouth at bedtime.     Marland Kitchen levonorgestrel (MIRENA) 20 MCG/24HR IUD 1 each by Intrauterine route once.      Marland Kitchen levothyroxine (SYNTHROID, LEVOTHROID) 150 MCG tablet TAKE 1 TABLET BY MOUTH EVERY MORNING 30 tablet 11  . meloxicam (MOBIC) 15 MG tablet     . omeprazole (PRILOSEC) 20 MG capsule TAKE ONE CAPSULE BY MOUTH DAILY 30 capsule 2   No current facility-administered medications on file prior to  visit.    BP 143/93 mmHg  Pulse 87  Temp(Src) 98.3 F (36.8 C) (Oral)  Ht 5\' 8"  (1.727 m)  Wt 232 lb 9.6 oz (105.507 kg)  BMI 35.38 kg/m2  SpO2 94%       Objective:   Physical Exam  General Mental Status- Alert. General Appearance- Not in acute distress.   Skin General: Color- Normal Color. Moisture- Normal Moisture.  Neck Carotid Arteries- Normal color. Moisture- Normal Moisture. No carotid bruits. No JVD. On palpation of neck where it inserts into occipital region pain on palpation.  Chest and Lung Exam Auscultation: Breath Sounds:-Normal.  Cardiovascular Auscultation:Rythm- Regular. Murmurs & Other Heart Sounds:Auscultation of the heart reveals- No Murmurs.  Abdomen Inspection:-Inspeection Normal. Palpation/Percussion:Note:No mass. Palpation and Percussion of the abdomen reveal- Non Tender, Non Distended + BS, no rebound or guarding.    Neurologic Cranial Nerve exam:- CN III-XII intact(No nystagmus), symmetric smile. Drift Test:- No drift. Romberg Exam:- Negative.  Heal to Toe Gait exam:-Normal. Finger to Nose:- Normal/Intact Strength:- 5/5 equal and symmetric strength both upper and lower extremities.      Assessment & Plan:

## 2014-02-21 ENCOUNTER — Ambulatory Visit (INDEPENDENT_AMBULATORY_CARE_PROVIDER_SITE_OTHER): Payer: BLUE CROSS/BLUE SHIELD | Admitting: Medical

## 2014-02-21 ENCOUNTER — Encounter: Payer: Self-pay | Admitting: Medical

## 2014-02-21 VITALS — BP 120/80 | HR 88 | Temp 97.8°F | Ht 68.0 in | Wt 236.2 lb

## 2014-02-21 DIAGNOSIS — R030 Elevated blood-pressure reading, without diagnosis of hypertension: Secondary | ICD-10-CM

## 2014-02-21 DIAGNOSIS — IMO0001 Reserved for inherently not codable concepts without codable children: Secondary | ICD-10-CM

## 2014-02-21 DIAGNOSIS — G44209 Tension-type headache, unspecified, not intractable: Secondary | ICD-10-CM

## 2014-02-21 MED ORDER — LISINOPRIL 5 MG PO TABS: 5.0000 mg | ORAL_TABLET | Freq: Every day | ORAL | Status: DC

## 2014-02-21 NOTE — Patient Instructions (Addendum)
Elevated BP Your bp has come down most of the time. Today good level. Ha subsided as well. Almost back to your baseline. I would recommend continuing bp med for one month. Continue to check your bp 3-5 times a week.   Headache I want you to decrease flexeril to 5 mg at night. Then stop in one week. HA may be tension component and I want to see if your ha increase when you stop flexeril.     Any severe ha with neurologic signs or symptoms then ED eval. Follow up in 1 month or as needed.

## 2014-02-21 NOTE — Assessment & Plan Note (Signed)
I want you to decrease flexeril to 5 mg at night. Then stop in one week. HA may be tension component and I want to see if your ha increase when you stop flexeril.

## 2014-02-21 NOTE — Progress Notes (Signed)
Subjective:    Patient ID: [name], female    DOB: 15-Feb-1970, 44 y.o.   MRN: [id number]  HPI   Pt in states her ha is decreased now significantly. By 10 am in the morning she has very faint ha level 2-3 at most. She will take excedrin migrane or aspirin. Her ha does not worsen. No associated neurologic signs or symptoms reported.   Pt bp is lower today than was there other day. Pt bp ranges 114/92 to up to 140/96. Her diastolic max is 025 on log review.  Pt had some tight trapezius muscle and using flexeril at night. She is not using the hydrocodone.   Past Medical History  Diagnosis Date  . Hypothyroid   . Overweight(278.02)   . CAD (coronary artery disease)     open heart surgery 09/12/10  . History of thyroid cancer   . GERD (gastroesophageal reflux disease)   . Arthritis   . Cancer 2007    thyroid  . Heart disease     History   Social History  . Marital Status: Married    Spouse Name: N/A    Number of Children: 2  . 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 Smoking status: Never Smoker   . Smokeless tobacco: Never Used  . Alcohol Use: Yes     Comment: rarely  . Drug Use: No  . Sexual Activity: Yes    Birth Control/ Protection: IUD   Other Topics Concern  . Not on file   Social History Narrative   Regular exercise-yes   Caffeine Use-yes    Past Surgical History  Procedure Laterality Date  . Thyroidectomy    . Coronary artery bypass grafting x1  09/13/10    [place] Gum  . Arm surgery      rt    Family History  Problem Relation Age of Onset  . Hypertension Mother   . Colon polyps Mother   . Arthritis Mother   . Hypertension Father   . Hiatal hernia Father   . Hypertension Sister   . Coronary artery disease Maternal Grandfather   . Heart disease Maternal Aunt   . Lupus Maternal Aunt   . Diabetes Maternal Aunt   . Colon cancer Neg Hx   . Arthritis Paternal Grandmother      No Known Allergies  Current Outpatient Prescriptions on File Prior to Visit  Medication Sig Dispense Refill  . aspirin 81 MG tablet Take 81 mg by mouth every morning.     . cyclobenzaprine (FLEXERIL) 10 MG tablet Take 1 tablet (10 mg total) by mouth at bedtime. 7 tablet 0  . diphenhydrAMINE (BENADRYL) 25 mg capsule Take 25 mg by mouth at bedtime.     Marland Kitchen HYDROcodone-acetaminophen (NORCO) 5-325 MG per tablet Take 1 tablet by mouth every 6 (six) hours as needed for moderate pain. 12 tablet 0  . levonorgestrel (MIRENA) 20 MCG/24HR IUD 1 each by Intrauterine route once.      Marland Kitchen levothyroxine (SYNTHROID, LEVOTHROID) 150 MCG tablet TAKE 1 TABLET BY MOUTH EVERY MORNING 30 tablet 11  . lisinopril (PRINIVIL,ZESTRIL) 5 MG tablet Take 1 tablet (5 mg total) by mouth daily. 30 tablet 0  . omeprazole (PRILOSEC) 20 MG capsule TAKE ONE CAPSULE BY MOUTH DAILY 30 capsule 2   No current facility-administered medications on file prior to visit.    BP 120/80 mmHg  Pulse 88  Temp(Src) 97.8 F (36.6 C) (Oral)  Ht 5\' 8"  (1.727 m)  Wt 236 lb 3.2 oz (107.14 kg)  BMI 35.92 kg/m2  SpO2 93%       Review of Systems  Constitutional: Negative for fever, chills, diaphoresis, activity change and fatigue.  Respiratory: Negative for cough, chest tightness and shortness of breath.   Cardiovascular: Negative for chest pain, palpitations and leg swelling.  Gastrointestinal: Negative for nausea, vomiting and abdominal pain.  Musculoskeletal: Negative for neck pain and neck stiffness.  Neurological: Positive for headaches. Negative for dizziness, tremors, seizures, syncope, facial asymmetry, speech difficulty, weakness, light-headedness and numbness.       Very faint ha now. Level 2 now.  Psychiatric/Behavioral: Negative for behavioral problems, confusion and agitation. The patient is not nervous/anxious.        Objective:   Physical Exam  General Mental Status- Alert. General Appearance- Not in acute  distress.   Skin General: Color- Normal Color. Moisture- Normal Moisture.  Neck Carotid Arteries- Normal color. Moisture- Normal Moisture. No carotid bruits. No JVD. Less tense and tender trapezius.  Chest and Lung Exam Auscultation: Breath Sounds:-Normal.  Cardiovascular Auscultation:Rythm- Regular. Murmurs & Other Heart Sounds:Auscultation of the heart reveals- No Murmurs.  Abdomen Inspection:-Inspeection Normal. Palpation/Percussion:Note:No mass. Palpation and Percussion of the abdomen reveal- Non Tender, Non Distended + BS, no rebound or guarding.    Neurologic Cranial Nerve exam:- CN III-XII intact(No nystagmus), symmetric smile. Romberg Exam:- Negative.  Finger to Nose:- Normal/Intact Strength:- 5/5 equal and symmetric strength both upper and lower extremities.      Assessment & Plan:

## 2014-02-21 NOTE — Progress Notes (Signed)
Pre visit review using our clinic review tool, if applicable. No additional management support is needed unless otherwise documented below in the visit note. 

## 2014-02-21 NOTE — Assessment & Plan Note (Signed)
Your bp has come down most of the time. Today good level. Ha subsided as well. Almost back to your baseline. I would recommend continuing bp med for one month. Continue to check your bp 3-5 times a week.

## 2014-05-14 ENCOUNTER — Other Ambulatory Visit: Payer: Self-pay | Admitting: Family Medicine

## 2014-05-15 NOTE — Telephone Encounter (Signed)
Med filled, letter mailed to pt to schedule a CPE.

## 2014-06-05 ENCOUNTER — Telehealth: Payer: Self-pay | Admitting: Cardiovascular Disease

## 2014-06-05 NOTE — Telephone Encounter (Signed)
Left message on machine for pt to contact the office.   

## 2014-06-05 NOTE — Telephone Encounter (Signed)
New Message  Pt called states that she will begin a running regiment if Dr. Bertram Savin. She is requesting a call back to determine what her exercise restrictions are. Please call back to discuss

## 2014-06-06 ENCOUNTER — Encounter: Payer: Self-pay | Admitting: Cardiovascular Disease

## 2014-06-06 NOTE — Telephone Encounter (Signed)
Pt sent a message through My Chart to Dr Burt Knack.

## 2014-06-09 NOTE — Telephone Encounter (Signed)
Dr Burt Knack responded to the pt through My Chart.

## 2014-08-02 ENCOUNTER — Other Ambulatory Visit: Payer: Self-pay | Admitting: Family Medicine

## 2014-08-02 NOTE — Telephone Encounter (Signed)
Med filled, pt has CPE October.

## 2014-08-15 ENCOUNTER — Ambulatory Visit (INDEPENDENT_AMBULATORY_CARE_PROVIDER_SITE_OTHER): Payer: BLUE CROSS/BLUE SHIELD | Admitting: Medical

## 2014-08-15 ENCOUNTER — Ambulatory Visit (HOSPITAL_BASED_OUTPATIENT_CLINIC_OR_DEPARTMENT_OTHER)
Admission: RE | Admit: 2014-08-15 | Discharge: 2014-08-15 | Disposition: A | Discharge status: Home / Self Care | Payer: BLUE CROSS/BLUE SHIELD | Source: Ambulatory Visit | Attending: Medical | Admitting: Medical

## 2014-08-15 ENCOUNTER — Encounter: Payer: Self-pay | Admitting: Medical

## 2014-08-15 VITALS — BP 104/61 | HR 78 | Temp 99.0°F | Ht 68.0 in | Wt 215.0 lb

## 2014-08-15 DIAGNOSIS — J208 Acute bronchitis due to other specified organisms: Secondary | ICD-10-CM

## 2014-08-15 DIAGNOSIS — R0982 Postnasal drip: Secondary | ICD-10-CM | POA: Diagnosis not present

## 2014-08-15 DIAGNOSIS — I2510 Atherosclerotic heart disease of native coronary artery without angina pectoris: Secondary | ICD-10-CM | POA: Diagnosis not present

## 2014-08-15 DIAGNOSIS — R059 Cough, unspecified: Secondary | ICD-10-CM

## 2014-08-15 DIAGNOSIS — R0989 Other specified symptoms and signs involving the circulatory and respiratory systems: Secondary | ICD-10-CM | POA: Insufficient documentation

## 2014-08-15 DIAGNOSIS — R05 Cough: Secondary | ICD-10-CM | POA: Insufficient documentation

## 2014-08-15 DIAGNOSIS — Z951 Presence of aortocoronary bypass graft: Secondary | ICD-10-CM | POA: Diagnosis not present

## 2014-08-15 MED ORDER — BENZONATATE 100 MG PO CAPS: 100.0000 mg | ORAL_CAPSULE | Freq: Three times a day (TID) | ORAL | Status: DC | PRN

## 2014-08-15 MED ORDER — FLUTICASONE PROPIONATE 50 MCG/ACT NA SUSP: 2.0000 | Freq: Every day | NASAL | Status: DC

## 2014-08-15 MED ORDER — AZITHROMYCIN 250 MG PO TABS: ORAL_TABLET | ORAL | Status: DC

## 2014-08-15 NOTE — Progress Notes (Signed)
Pre visit review using our clinic review tool, if applicable. No additional management support is needed unless otherwise documented below in the visit note. 

## 2014-08-15 NOTE — Progress Notes (Signed)
Subjective:    Patient ID: [name], female    DOB: [birth year], 44 y.o.   MRN: [id number]  HPI  Pt in cough for one month. Pt states got back from [place] and got cough. Faint mild phlem when she coughs. Some pnd. Coughing a lot at night except none last night. No wheezing. No sob. Pt does not smoke. Rare sneezing. No itching eyes. Pt states can't take antihistamines except claritin.       Review of Systems  Constitutional: Negative for fever, chills and fatigue.  HENT: Positive for postnasal drip. Negative for congestion, drooling and rhinorrhea.   Respiratory: Positive for cough. Negative for chest tightness, shortness of breath and wheezing.   Cardiovascular: Negative for chest pain and palpitations.  Musculoskeletal: Negative for back pain.  Neurological: Negative for dizziness, weakness, numbness and headaches.  Hematological: Negative for adenopathy. Does not bruise/bleed easily.  Psychiatric/Behavioral: Negative for behavioral problems and confusion.    Past Medical History  Diagnosis Date  . Hypothyroid   . Overweight(278.02)   . CAD (coronary artery disease)     open heart surgery 09/12/10  . History of thyroid cancer   . GERD (gastroesophageal reflux disease)   . Arthritis   . Cancer 2007    thyroid  . Heart disease     History   Social History  . Marital Status: Married    Spouse Name: N/A  . Number of Children: 2  . 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 Smoking status: Never Smoker   . Smokeless tobacco: Never Used  . Alcohol Use: Yes     Comment: rarely  . Drug Use: No  . Sexual Activity: Yes    Birth Control/ Protection: IUD   Other Topics Concern  . Not on file   Social History Narrative   Regular exercise-yes   Caffeine Use-yes    Past Surgical History  Procedure Laterality Date  . Thyroidectomy    . Coronary artery bypass grafting x1  09/13/10    [place] Gum  . Arm surgery      rt    Family History  Problem Relation Age of Onset  . Hypertension Mother   . Colon polyps Mother   . Arthritis Mother   . Hypertension Father   . Hiatal hernia Father   . Hypertension Sister   . Coronary artery disease Maternal Grandfather   . Heart disease Maternal Aunt   . Lupus Maternal Aunt   . Diabetes Maternal Aunt   . Colon cancer Neg Hx   . Arthritis Paternal Grandmother     No Known Allergies  Current Outpatient Prescriptions on File Prior to Visit  Medication Sig Dispense Refill  . aspirin 81 MG tablet Take 81 mg by mouth every morning.     . diphenhydrAMINE (BENADRYL) 25 mg capsule Take 25 mg by mouth at bedtime.     Marland Kitchen levonorgestrel (MIRENA) 20 MCG/24HR IUD 1 each by Intrauterine route once.      Marland Kitchen levothyroxine (SYNTHROID, LEVOTHROID) 150 MCG tablet TAKE 1 TABLET BY MOUTH EVERY MORNING 30 tablet 11  . omeprazole (PRILOSEC) 20 MG capsule TAKE 1 CAPSULE BY MOUTH DAILY 30 capsule 0   No current facility-administered medications on file prior to visit.    BP 104/61 mmHg  Pulse 78  Temp(Src) 99 F (37.2 C) (Oral)  Ht 5\' 8"  (1.727 m)  Wt 215 lb (  97.523 kg)  BMI 32.70 kg/m2  SpO2 96%  LMP 08/15/2014       Objective:   Physical Exam  General  Mental Status - Alert. General Appearance - Well groomed. Not in acute distress.  Skin Rashes- No Rashes.  HEENT Head- Normal. Ear Auditory Canal - Left- Normal. Right - Normal.Tympanic Membrane- Left- Normal. Right- Normal. Eye Sclera/Conjunctiva- Left- Normal. Right- Normal. Nose & Sinuses Nasal Mucosa- Left- Mild  boggy or Congested. Right-  Mild  boggy or Congested. Mouth & Throat Lips: Upper Lip- Normal: no dryness, cracking, pallor, cyanosis, or vesicular eruption. Lower Lip-Normal: no dryness, cracking, pallor, cyanosis or vesicular eruption. Buccal Mucosa- Bilateral- No Aphthous ulcers. Oropharynx- No Discharge or Erythema. +pnd Tonsils: Characteristics- Bilateral- No  Erythema or Congestion. Size/Enlargement- Bilateral- No enlargement. Discharge- bilateral-None.  Neck Neck- Supple. No Masses.   Chest and Lung Exam Auscultation: Breath Sounds:- even and unlabored  Cardiovascular Auscultation:Rythm- Regular, rate and rhythm. Murmurs & Other Heart Sounds:Ausculatation of the heart reveal- No Murmurs.  Lymphatic Head & Neck General Head & Neck Lymphatics: Bilateral: Description- No Localized lymphadenopathy.       Assessment & Plan:  Cough for one month. May now have cyclical type cough. May have some allergies as well. Rx flonase and benzonatate.   Can restart claritin.  Get cxr today.  If by Friday cough persists and productive then start azithromycin.  Follow up in 7-10 days or as needed

## 2014-08-15 NOTE — Patient Instructions (Signed)
Cough for one month. May now have cyclical type cough. May have some allergies as well. Rx flonase and benzonatate.   Can restart claritin.  Get cxr today.  If by Friday cough persists and productive then start azithromycin.  Follow up in 7-10 days or as needed

## 2014-08-30 ENCOUNTER — Other Ambulatory Visit: Payer: Self-pay | Admitting: Family Medicine

## 2014-08-31 NOTE — Telephone Encounter (Signed)
Medication filled to pharmacy as requested.   

## 2014-10-19 ENCOUNTER — Encounter: Payer: BLUE CROSS/BLUE SHIELD | Admitting: Family Medicine

## 2014-11-23 ENCOUNTER — Telehealth: Payer: Self-pay | Admitting: Family Medicine

## 2014-11-23 NOTE — Telephone Encounter (Signed)
Pt stated this was done at [facility] (didn't know if we could see their records electronically). She is asking if repeat needs done at [facility] or if she can get in quickly to a [place] facility to recheck abnormality with mammogram. She tried faxing but the fax will not go thru. She doesn't know if it is her machine or ours. This is a more urgent matter and she would like to know best course. Please contact asap [phone].

## 2014-11-23 NOTE — Telephone Encounter (Signed)
Called pt and advised to make appt with [facility], appt made for next week. Will advise office of the results.

## 2014-11-23 NOTE — Telephone Encounter (Signed)
Noted will wait on letter to ensure ordered to correct site.

## 2014-11-23 NOTE — Telephone Encounter (Signed)
Caller name: Self   Can be reached: [phone]   Reason for call: Patient is requesting a referral for an additional Mammogram. Had one on 11/02 and received letter that she needed to have another one done. Patient is faxing the letter to the office now.

## 2014-11-23 NOTE — Telephone Encounter (Signed)
If abnormal mammo, pt should repeat at same site correct?

## 2014-11-23 NOTE — Telephone Encounter (Signed)
Would recommend repeat imaging at the same site for comparison

## 2014-11-23 NOTE — Telephone Encounter (Signed)
Pt is calling to check on paperwork that she faxed this morning. She normally gets a confirmation from her fax machine but it didn't print. She wants to make sure we received a sheet she faxed from [facility] from a mammography. Please notify her if you received it or not at [phone].

## 2014-11-23 NOTE — Telephone Encounter (Signed)
Called pt back to inform, line was busy will try again later.

## 2014-11-24 NOTE — Telephone Encounter (Signed)
Received and forwarded to Dr. Tabori. JG//CMA  

## 2014-11-27 ENCOUNTER — Ambulatory Visit: Payer: BLUE CROSS/BLUE SHIELD | Admitting: Nurse Practitioner

## 2014-12-04 ENCOUNTER — Encounter: Payer: Self-pay | Admitting: General Practice

## 2014-12-10 ENCOUNTER — Other Ambulatory Visit: Payer: Self-pay | Admitting: Endocrinology

## 2014-12-11 ENCOUNTER — Encounter: Payer: Self-pay | Admitting: Family Medicine

## 2014-12-11 ENCOUNTER — Encounter: Payer: Self-pay | Admitting: Cardiovascular Disease

## 2014-12-11 ENCOUNTER — Telehealth: Payer: Self-pay | Admitting: Family Medicine

## 2014-12-11 DIAGNOSIS — C50919 Malignant neoplasm of unspecified site of unspecified female breast: Secondary | ICD-10-CM

## 2014-12-11 NOTE — Telephone Encounter (Signed)
Referral sent to [facility]

## 2014-12-11 NOTE — Telephone Encounter (Signed)
Spoke with pt. She would like to have treatment with [facility] providers since she has had cardiology exams in the past. Pt likes the idea of all of her providers being able to see her chart. Pt will request and bring a CD of all images and OV notes.

## 2014-12-11 NOTE — Telephone Encounter (Signed)
Sorry this should have been ordered for [facility] location. I went with the one Tabori advised on .

## 2014-12-11 NOTE — Telephone Encounter (Signed)
Thi should all be scheduled for pt through [facility] since that is where diagnosis came from?

## 2014-12-11 NOTE — Telephone Encounter (Signed)
[facility] called and needed to know if this patient needs to [facility]. Amy at [phone] will get that set up if she wants to come to [place] for treatment

## 2014-12-11 NOTE — Telephone Encounter (Signed)
Pt called to get advice how to proceed since cancer DX. Pt said that she had screening for mammogram, f/u mammogram, ultrasound, and biopsy thru [facility]. She is stating that she is being advised to have genetic testing, possible radiation, possible lumpectomy, possible mastectomy. She is asking if Dr. Birdie Riddle recommeds her to have procedures thru [facility] to change back to [facility]. Pt also noted she is scheduled for vacation 12/22/14-01/11/15. She is requesting call back on cell #.

## 2014-12-11 NOTE — Telephone Encounter (Signed)
1st- the pt needs to decide where she wants to have treatment.  She can have all of this done through [facility], or she can choose to have treatment through [facility].  I have no preference but her getting rapid treatment is the most important thing.  If she wants to start her care through [facility], we can certainly make the referral but she would need to get all of the imaging reports (and images on disc if possible).  We will be happy to help in any way

## 2014-12-14 ENCOUNTER — Other Ambulatory Visit: Payer: Self-pay

## 2014-12-14 ENCOUNTER — Telehealth: Payer: Self-pay | Admitting: *Deleted

## 2014-12-14 DIAGNOSIS — C50919 Malignant neoplasm of unspecified site of unspecified female breast: Secondary | ICD-10-CM | POA: Insufficient documentation

## 2014-12-14 DIAGNOSIS — C73 Malignant neoplasm of thyroid gland: Secondary | ICD-10-CM

## 2014-12-14 HISTORY — DX: Malignant neoplasm of unspecified site of unspecified female breast: C50.919

## 2014-12-14 NOTE — Telephone Encounter (Signed)
Mailed before appt letter, calendar, welcoming packet & intake form to pt. 

## 2014-12-17 NOTE — Progress Notes (Signed)
[name]  [phone] [phone]     ID: [name] DOB: Nov 13, 1970  MR#: [id number]  YCX#:[id number]  Patient Care Team: Midge Minium, MD as PCP - General (Family Medicine) Kathyrn Lass, MD (Family Medicine) Rolm Bookbinder, MD as Consulting Physician (General Surgery) Irene Limbo, MD as Consulting Physician (Plastic Surgery) Sherren Mocha, MD as Consulting Physician (Cardiology) PCP: Annye Asa, MD GYN: SU:  OTHER MD:  CHIEF COMPLAINT: Right-sided ductal carcinoma in situ, estrogen receptor positive  CURRENT TREATMENT: awaiting definitive surgery   BREAST CANCER HISTORY: [name] had bilateral screening mammography at a mobile clinic 11/16/2014. This showed a focal density in the lower inner quadrant of the right breast and she was recalled for diagnostic right mammography and right breast ultrasonography at the [facility] 11/28/2014. This showed the breast density to be category B. In the lower inner right breast there was a group of amorphous calcifications spanning approximately 3 cm. These were new as compared to mammography from 2014. Ultrasound of the area showed an ill-defined region of hypoechoic tissue but no definite mass.  Biopsy of the right breast mass in question 12/05/2014 showed ([id number]) ductal carcinoma in situ, grade 1, arising within an intraductal papilloma. The cells were estrogen receptor 9200% positive, progesterone receptor 80-90% positive, both with strong staining intensity.  The patient's subsequent history is as detailed below.  INTERVAL HISTORY: Casia was evaluated in the breast clinic 12/18/2014 accompanied by her husband [name].  REVIEW OF SYSTEMS: There were no specific symptoms leading to the original mammogram, which was routinely scheduled. The patient denies unusual headaches, visual changes, nausea, vomiting, stiff neck, dizziness, or gait imbalance. There has  been no cough, phlegm production, or pleurisy, no chest pain or pressure, and no change in bowel or bladder habits. The patient denies fever, rash, bleeding, unexplained fatigue or unexplained weight loss.She admits to mild aches and pains here in there which are not more persistent or intense than prior. She exercises chiefly by walking. A detailed review of systems was otherwise entirely negative.  PAST MEDICAL HISTORY: Past Medical History  Diagnosis Date  . Hypothyroid   . Overweight(278.02)   . CAD (coronary artery disease)     open heart surgery 09/12/10  . History of thyroid cancer   . GERD (gastroesophageal reflux disease)   . Arthritis   . Cancer 2007    thyroid  . Heart disease     PAST SURGICAL HISTORY: Past Surgical History  Procedure Laterality Date  . Thyroidectomy    . Coronary artery bypass grafting x1  09/13/10    [place] Gum  . Arm surgery      rt    FAMILY HISTORY Family History  Problem Relation Age of Onset  . Hypertension Mother   . Colon polyps Mother   . Arthritis Mother   . Hypertension Father   . Hiatal hernia Father   . Hypertension Sister   . Coronary artery disease Maternal Grandfather   . Heart disease Maternal Aunt   . Lupus Maternal Aunt   . Diabetes Maternal Aunt   . Colon cancer Neg Hx   . Arthritis Paternal Grandmother   the patient's parents are in their late 40s as of December 2016. The patient's mother was diagnosed with breast cancer age 55; on the maternal side, the maternal grandmother was diagnosed with uterine cancer at age 46. 2 maternal aunts had one breast cancer and one uterine cancer and one maternal uncle had colon cancer  is were in their 46s and 60s. On the father's side there is an uncle with colon cancer age 46. There is no history of ovarian cancer to the patient's knowledge   GYNECOLOGIC HISTORY:  No LMP recorded. Patient is not currently having periods (Reason: IUD). Menarche age 17, first live birth age 28. The patient  is GX P2. She still having regular periods. She has a Mirena IUD in place.  SOCIAL HISTORY:  [name] works as a Dealer. Her husband [name] is an Brewing technologist. There is son [name] is a Equities trader at [employer]. Their daughter [name] is a Museum/gallery exhibitions officer at [employer]: Not in place   HEALTH MAINTENANCE: Social History  Substance Use Topics  . Smoking status: Never Smoker   . Smokeless tobacco: Never Used  . Alcohol Use: Yes     Comment: rarely     Colonoscopy:  PAP: 2013  Bone density:  Lipid panel:  No Known Allergies  Current Outpatient Prescriptions  Medication Sig Dispense Refill  . aspirin 81 MG tablet Take 81 mg by mouth every morning.     Marland Kitchen azithromycin (ZITHROMAX) 250 MG tablet Take 2 tablets by mouth on day 1, followed by 1 tablet by mouth daily for 4 days. 6 tablet 0  . benzonatate (TESSALON) 100 MG capsule Take 1 capsule (100 mg total) by mouth 3 (three) times daily as needed for cough. 30 capsule 0  . diphenhydrAMINE (BENADRYL) 25 mg capsule Take 25 mg by mouth at bedtime.     . fluticasone (FLONASE) 50 MCG/ACT nasal spray Place 2 sprays into both nostrils daily. 16 g 1  . levonorgestrel (MIRENA) 20 MCG/24HR IUD 1 each by Intrauterine route once.      Marland Kitchen levothyroxine (SYNTHROID, LEVOTHROID) 150 MCG tablet TAKE 1 TABLET BY MOUTH EVERY MORNING 30 tablet 0  . omeprazole (PRILOSEC) 20 MG capsule TAKE 1 CAPSULE BY MOUTH DAILY 30 capsule 4   No current facility-administered medications for this visit.    OBJECTIVE: Middle-aged white woman who appears well Filed Vitals:   12/18/14 1609  BP: 119/69  Pulse: 85  Temp: 98.3 F (36.8 C)  Resp: 18     Body mass index is 27.68 kg/(m^2).    ECOG FS:0 - Asymptomatic  Ocular: Sclerae unicteric, pupils equal, round and reactive to light Ear-nose-throat: Oropharynx clear and moist Lymphatic: No cervical or supraclavicular adenopathy Lungs no rales or rhonchi,  good excursion bilaterally Heart regular rate and rhythm, no murmur appreciated Abd soft, nontender, positive bowel sounds MSK no focal spinal tenderness, no joint edema; median sternotomy scar noted Neuro: non-focal, well-oriented, appropriate affect Breasts: The right breast is status post recent biopsy. There are no palpable masses. There are no skin or nipple changes of concern. The right axilla is benign. The left breast is unremarkable.   LAB RESULTS:  CMP     Component Value Date/Time   NA 139 12/18/2014 1555   NA 135 02/12/2014 1740   K 4.0 12/18/2014 1555   K 3.5 02/12/2014 1740   CL 108 02/12/2014 1740   CO2 24 12/18/2014 1555   CO2 24 02/12/2014 1740   GLUCOSE 96 12/18/2014 1555   GLUCOSE 100* 02/12/2014 1740   BUN 15.2 12/18/2014 1555   BUN 10 02/12/2014 1740   CREATININE 1.0 12/18/2014 1555   CREATININE 0.85 02/12/2014 1740   CALCIUM 9.6 12/18/2014 1555   CALCIUM 9.1 02/12/2014 1740   PROT 6.7 12/18/2014 1555  PROT 7.1 09/22/2011 1048   ALBUMIN 3.9 12/18/2014 1555   ALBUMIN 4.0 09/22/2011 1048   AST 27 12/18/2014 1555   AST 23 09/22/2011 1048   ALT 31 12/18/2014 1555   ALT 29 09/22/2011 1048   ALKPHOS 88 12/18/2014 1555   ALKPHOS 111 09/22/2011 1048   BILITOT 1.31* 12/18/2014 1555   BILITOT 1.1 09/22/2011 1048   GFRNONAA 82* 02/12/2014 1740   GFRAA >90 02/12/2014 1740    INo results found for: SPEP, UPEP  Lab Results  Component Value Date   WBC 8.2 12/18/2014   NEUTROABS 5.9 12/18/2014   HGB 13.3 12/18/2014   HCT 39.6 12/18/2014   MCV 87.9 12/18/2014   PLT 283 12/18/2014      Chemistry      Component Value Date/Time   NA 139 12/18/2014 1555   NA 135 02/12/2014 1740   K 4.0 12/18/2014 1555   K 3.5 02/12/2014 1740   CL 108 02/12/2014 1740   CO2 24 12/18/2014 1555   CO2 24 02/12/2014 1740   BUN 15.2 12/18/2014 1555   BUN 10 02/12/2014 1740   CREATININE 1.0 12/18/2014 1555   CREATININE 0.85 02/12/2014 1740      Component Value Date/Time    CALCIUM 9.6 12/18/2014 1555   CALCIUM 9.1 02/12/2014 1740   ALKPHOS 88 12/18/2014 1555   ALKPHOS 111 09/22/2011 1048   AST 27 12/18/2014 1555   AST 23 09/22/2011 1048   ALT 31 12/18/2014 1555   ALT 29 09/22/2011 1048   BILITOT 1.31* 12/18/2014 1555   BILITOT 1.1 09/22/2011 1048       No results found for: LABCA2  No components found for: LABCA125  No results for input(s): INR in the last 168 hours.  Urinalysis    Component Value Date/Time   COLORURINE YELLOW 09/11/2010 1937   APPEARANCEUR CLEAR 09/11/2010 1937   LABSPEC 1.008 09/11/2010 1937   PHURINE 7.0 09/11/2010 1937   GLUCOSEU NEGATIVE 09/11/2010 1937   HGBUR NEGATIVE 09/11/2010 1937   BILIRUBINUR NEGATIVE 09/11/2010 1937   KETONESUR NEGATIVE 09/11/2010 1937   PROTEINUR NEGATIVE 09/11/2010 1937   UROBILINOGEN 0.2 09/11/2010 1937   NITRITE NEGATIVE 09/11/2010 1937   LEUKOCYTESUR TRACE* 09/11/2010 1937    STUDIES: No results found.  ASSESSMENT: 44 y.o. [place] woman status post right breast lower inner quadrant biopsy 12/05/2014 for ductal carcinoma in situ arising from an intraductal papilloma, estrogen and progesterone receptor positive  (1) genetics testing pending  (2) definitive surgery to follow  PLAN: We spent the better part of today's hour-long appointment discussing the biology of breast cancer in general, and the specifics of the patient's tumor in particular. The patient understands that in noninvasive ductal carcinoma, also called ductal carcinoma in situ ("DCIS") the breast cancer cells remain trapped in the ducts were they started. They cannot travel to a vital organ. For that reason these cancers in themselves are not life-threatening.  If the whole breast is removed then all the ducts are removed and since the cancer cells are trapped in the ducts, the cure rate with mastectomy for noninvasive breast cancer is approximately 99%. Nevertheless we recommend lumpectomy, because there is no  survival advantage to mastectomy and because the cosmetic result is generally superior with breast conservation.  In patients who carry a deleterious mutations such as for example BRCA, the risk of a new breast cancer developing may be sufficiently great that the patient may choose bilateral mastectomies. However if the patient wishes to keep her breasts in  that situation it is safe to do so. That would require intensified screening, which generally means not only yearly mammography but a yearly breast MRI as well. Of course, if there is a deleterious mutation bilateral oophorectomy would be necessary as there is no standard screening protocol for ovarian cancer.  Emerly has a good understanding of all this. At this point she is considering bilateral mastectomies if she carries a deleterious mutation. She is concerned also that a mastectomy on the right may be unavoidable because of the size of the DCIS by mammography relative to the size of her breasts. I think an MRI of the breast is mandatory here for surgical planning.  I have suggested she begin acquainting herself with images of various types of mastectomies and reconstruction in preparation for decision making. She and [name] are going to leave town December 9 and not return until December 29 so her surgical appointments will be early January. However as soon as I get the genetics results (probably next week) I will give her a call.  Tentatively I have scheduled Linnaea to return to see me in February 2017. She knows to call for any problems that may develop before then.  Chauncey Cruel, MD   12/18/2014 5:29 PM Medical Oncology and Hematology [facility] [address] [place], [place] [address] Tel. [phone]    Fax. [phone]

## 2014-12-18 ENCOUNTER — Ambulatory Visit (HOSPITAL_BASED_OUTPATIENT_CLINIC_OR_DEPARTMENT_OTHER): Payer: BLUE CROSS/BLUE SHIELD | Admitting: Oncology

## 2014-12-18 ENCOUNTER — Other Ambulatory Visit (HOSPITAL_BASED_OUTPATIENT_CLINIC_OR_DEPARTMENT_OTHER): Payer: BLUE CROSS/BLUE SHIELD

## 2014-12-18 VITALS — BP 119/69 | HR 85 | Temp 98.3°F | Resp 18 | Ht 68.0 in | Wt 182.0 lb

## 2014-12-18 DIAGNOSIS — C73 Malignant neoplasm of thyroid gland: Secondary | ICD-10-CM

## 2014-12-18 DIAGNOSIS — D0511 Intraductal carcinoma in situ of right breast: Secondary | ICD-10-CM

## 2014-12-18 DIAGNOSIS — C50912 Malignant neoplasm of unspecified site of left female breast: Secondary | ICD-10-CM

## 2014-12-18 DIAGNOSIS — Z85850 Personal history of malignant neoplasm of thyroid: Secondary | ICD-10-CM | POA: Diagnosis not present

## 2014-12-18 DIAGNOSIS — Z170 Estrogen receptor positive status [ER+]: Secondary | ICD-10-CM

## 2014-12-18 DIAGNOSIS — C50919 Malignant neoplasm of unspecified site of unspecified female breast: Secondary | ICD-10-CM

## 2014-12-18 DIAGNOSIS — C50911 Malignant neoplasm of unspecified site of right female breast: Secondary | ICD-10-CM | POA: Insufficient documentation

## 2014-12-18 DIAGNOSIS — C50311 Malignant neoplasm of lower-inner quadrant of right female breast: Secondary | ICD-10-CM

## 2014-12-18 LAB — CBC WITH DIFFERENTIAL/PLATELET
BASO%: 0.6 % (ref 0.0–2.0)
BASOS ABS: 0 10*3/uL (ref 0.0–0.1)
EOS ABS: 0 10*3/uL (ref 0.0–0.5)
EOS%: 0.3 % (ref 0.0–7.0)
HCT: 39.6 % (ref 34.8–46.6)
HGB: 13.3 g/dL (ref 11.6–15.9)
LYMPH%: 20.4 % (ref 14.0–49.7)
MCH: 29.5 pg (ref 25.1–34.0)
MCHC: 33.5 g/dL (ref 31.5–36.0)
MCV: 87.9 fL (ref 79.5–101.0)
MONO#: 0.5 10*3/uL (ref 0.1–0.9)
MONO%: 6.2 % (ref 0.0–14.0)
NEUT%: 72.5 % (ref 38.4–76.8)
NEUTROS ABS: 5.9 10*3/uL (ref 1.5–6.5)
PLATELETS: 283 10*3/uL (ref 145–400)
RBC: 4.5 10*6/uL (ref 3.70–5.45)
RDW: 12.9 % (ref 11.2–14.5)
WBC: 8.2 10*3/uL (ref 3.9–10.3)
lymph#: 1.7 10*3/uL (ref 0.9–3.3)

## 2014-12-18 LAB — COMPREHENSIVE METABOLIC PANEL
ALBUMIN: 3.9 g/dL (ref 3.5–5.0)
ALK PHOS: 88 U/L (ref 40–150)
ALT: 31 U/L (ref 0–55)
ANION GAP: 8 meq/L (ref 3–11)
AST: 27 U/L (ref 5–34)
BILIRUBIN TOTAL: 1.31 mg/dL — AB (ref 0.20–1.20)
BUN: 15.2 mg/dL (ref 7.0–26.0)
CO2: 24 meq/L (ref 22–29)
Calcium: 9.6 mg/dL (ref 8.4–10.4)
Chloride: 107 mEq/L (ref 98–109)
Creatinine: 1 mg/dL (ref 0.6–1.1)
EGFR: 68 mL/min/{1.73_m2} — ABNORMAL LOW (ref >90)
Glucose: 96 mg/dl (ref 70–140)
Potassium: 4 mEq/L (ref 3.5–5.1)
Sodium: 139 mEq/L (ref 136–145)
Total Protein: 6.7 g/dL (ref 6.4–8.3)

## 2014-12-19 ENCOUNTER — Telehealth: Payer: Self-pay | Admitting: Oncology

## 2014-12-19 NOTE — Addendum Note (Signed)
Addended by: Lujean Amel on: 12/19/2014 08:31 AM   Modules accepted: Orders, Medications

## 2014-12-19 NOTE — Telephone Encounter (Signed)
Called patient with all appointments,will sed dr Donne Hazel 1 6 17  10:00 and [place] 1 11 17  9:00,she already had genetics with [facility] and the results should be back soon and she will have those sent to [place]

## 2014-12-29 ENCOUNTER — Encounter: Payer: Self-pay | Admitting: Genetic Counselor

## 2014-12-29 DIAGNOSIS — Z1509 Genetic susceptibility to other malignant neoplasm: Secondary | ICD-10-CM

## 2014-12-29 DIAGNOSIS — Z1501 Genetic susceptibility to malignant neoplasm of breast: Secondary | ICD-10-CM | POA: Insufficient documentation

## 2015-01-12 ENCOUNTER — Other Ambulatory Visit: Payer: Self-pay | Admitting: Oncology

## 2015-01-12 ENCOUNTER — Ambulatory Visit
Admission: RE | Admit: 2015-01-12 | Discharge: 2015-01-12 | Disposition: A | Discharge status: Home / Self Care | Payer: BLUE CROSS/BLUE SHIELD | Source: Ambulatory Visit | Attending: Oncology | Admitting: Oncology

## 2015-01-12 DIAGNOSIS — C73 Malignant neoplasm of thyroid gland: Secondary | ICD-10-CM

## 2015-01-12 DIAGNOSIS — C50311 Malignant neoplasm of lower-inner quadrant of right female breast: Secondary | ICD-10-CM

## 2015-01-12 MED ORDER — GADOBENATE DIMEGLUMINE 529 MG/ML IV SOLN
17.0000 mL | Freq: Once | INTRAVENOUS | Status: AC | PRN
  Administered 2015-01-12: 17 mL via INTRAVENOUS

## 2015-01-15 ENCOUNTER — Encounter: Payer: Self-pay | Admitting: Oncology

## 2015-01-15 ENCOUNTER — Other Ambulatory Visit: Payer: Self-pay | Admitting: Oncology

## 2015-01-21 ENCOUNTER — Encounter: Payer: Self-pay | Admitting: Cardiovascular Disease

## 2015-01-24 ENCOUNTER — Ambulatory Visit (INDEPENDENT_AMBULATORY_CARE_PROVIDER_SITE_OTHER): Payer: BLUE CROSS/BLUE SHIELD | Admitting: Cardiovascular Disease

## 2015-01-24 ENCOUNTER — Encounter: Payer: Self-pay | Admitting: Cardiovascular Disease

## 2015-01-24 VITALS — BP 120/80 | HR 77 | Ht 68.0 in | Wt 185.0 lb

## 2015-01-24 DIAGNOSIS — I25810 Atherosclerosis of coronary artery bypass graft(s) without angina pectoris: Secondary | ICD-10-CM | POA: Diagnosis not present

## 2015-01-24 NOTE — Patient Instructions (Signed)
Medication Instructions:  Your physician recommends that you continue on your current medications as directed. Please refer to the Current Medication list given to you today.  Labwork: No new orders.   Testing/Procedures: No new orders.   Follow-Up: Your physician wants you to follow-up in: 1 YEAR with Dr Burt Knack.  You will receive a reminder letter in the mail two months in advance. If you don't receive a letter, please call our office to schedule the follow-up appointment.  You are cleared for surgery.   Any Other Special Instructions Will Be Listed Below (If Applicable).     If you need a refill on your cardiac medications before your next appointment, please call your pharmacy.

## 2015-01-24 NOTE — Progress Notes (Signed)
Cardiology Office Note Date:  01/25/2015   ID:  [name], DOB 1970-11-06, MRN [id number]  PCP:  Annye Asa, MD  Cardiologist:  Sherren Mocha, MD    Chief Complaint  Patient presents with  . Follow-up    surgical cleareance     History of Present Illness: [name] is a 45 y.o. female who presents for  Preoperative cardiovascular evaluation.  She has been diagnosed with breast cancer and is tentatively scheduled to undergo bilateral mastectomy.  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 She underwent single-vessel CABG at that time. After surgery, she had a gated cardiac CTA demonstrating complete healing of the native right coronary artery and continued patency of the saphenous vein graft RCA. Her LVEF was initially 50% with hypokinesis of the inferior wall. Follow-up echocardiogram demonstrated normalization of LV function with an ejection fraction of 55-60%.   From a cardiac perspective she is doing well. She is walking regularly for exercise and has no symptoms with physical exertion. In fact, she's lost 50 pounds through diet and exercise over the past year. Today, she denies symptoms of palpitations, shortness of breath, orthopnea, PND, lower extremity edema, dizziness, or syncope. She has occasional chest discomfort that she relates to emotional stress.   Past Medical History  Diagnosis Date  . Hypothyroid   . Overweight(278.02)   . CAD (coronary artery disease)     open heart surgery 09/12/10  . History of thyroid cancer   . GERD (gastroesophageal reflux disease)   . Arthritis   . Cancer [facility]) 2007    thyroid  . Heart disease     Past Surgical History  Procedure Laterality Date  . Thyroidectomy    . Coronary artery bypass grafting x1  09/13/10    [place] Gum  . Arm surgery      rt    Current Outpatient Prescriptions  Medication Sig Dispense Refill  . aspirin 81 MG tablet Take 81 mg by mouth every morning.     .  diphenhydrAMINE (BENADRYL) 25 mg capsule Take 25 mg by mouth at bedtime.     Marland Kitchen levonorgestrel (MIRENA) 20 MCG/24HR IUD 1 each by Intrauterine route once.      Marland Kitchen levothyroxine (SYNTHROID, LEVOTHROID) 150 MCG tablet TAKE 1 TABLET BY MOUTH EVERY MORNING 30 tablet 0   No current facility-administered medications for this visit.    Allergies:   Review of patient's allergies indicates no known allergies.   Social History:  The patient  reports that she has never smoked. She has never used smokeless tobacco. She reports that she drinks alcohol. She reports that she does not use illicit drugs.   Family History:  The patient's  family history includes Arthritis in her mother and paternal grandmother; Colon polyps in her mother; Coronary artery disease in her maternal grandfather; Diabetes in her maternal aunt; Heart disease in her maternal aunt; Hiatal hernia in her father; Hypertension in her father, mother, and sister; Lupus in her maternal aunt. There is no history of Colon cancer.    ROS:  Please see the history of present illness.  All other systems are reviewed and negative.    PHYSICAL EXAM: VS:  BP 120/80 mmHg  Pulse 77  Ht 5\' 8"  (1.727 m)  Wt 185 lb (83.915 kg)  BMI 28.14 kg/m2  SpO2 98% , BMI Body mass index is 28.14 kg/(m^2). GEN: Well nourished, well developed, in no acute distress HEENT: normal Neck: no JVD, no masses.  No carotid bruits Cardiac: RRR without murmur or gallop                Respiratory:  clear to auscultation bilaterally, normal work of breathing GI: soft, nontender, nondistended, + BS MS: no deformity or atrophy Ext: no pretibial edema, pedal pulses 2+= bilaterally Skin: warm and dry, no rash Neuro:  Strength and sensation are intact Psych: euthymic mood, full affect  EKG:  EKG is ordered today. The ekg ordered today shows NSR 72 bpm, within normal limits  Recent Labs: 12/18/2014: ALT 31; BUN 15.2; Creatinine 1.0; HGB 13.3; Platelets 283; Potassium 4.0;  Sodium 139   Lipid Panel     Component Value Date/Time   CHOL 156 09/22/2011 1048   TRIG 164.0* 09/22/2011 1048   HDL 38.00* 09/22/2011 1048   CHOLHDL 4 09/22/2011 1048   VLDL 32.8 09/22/2011 1048   LDLCALC 85 09/22/2011 1048      Wt Readings from Last 3 Encounters:  01/24/15 185 lb (83.915 kg)  12/18/14 182 lb (82.555 kg)  08/15/14 215 lb (97.523 kg)     Cardiac Studies Reviewed: 2D Echo 10-29-2012: Study Conclusions  - Left ventricle: The cavity size was normal. Wall thickness was increased in a pattern of mild LVH. Systolic function was normal. The estimated ejection fraction was in the range of 55% to 60%. Wall motion was normal; there were no regional wall motion abnormalities. Doppler parameters are consistent with abnormal left ventricular relaxation (grade 1 diastolic dysfunction). - Aortic valve: There was no stenosis. - Mitral valve: No significant regurgitation. - Left atrium: The atrium was mildly dilated. - Right ventricle: The cavity size was normal. Systolic function was normal. - Right atrium: The atrium was mildly dilated. - Tricuspid valve: Peak RV-RA gradient: 71mm Hg (S). - Pulmonary arteries: PA peak pressure: 75mm Hg (S). - Inferior vena cava: The vessel was normal in size; the respirophasic diameter changes were in the normal range (= 50%); findings are consistent with normal central venous pressure. Impressions:  - Normal LV size with mild LV hypertrophy. EF 55-60%. Normal RV size and systolic function. Mild biatrial enlargement.  ASSESSMENT AND PLAN: CAD, native vessel, with hx of spontaneous coronary artery dissection: she is s/p single vessel CABG without recurrence of exertional angina. CTA post-CABG showed healing of the native RCA and patency of the SVG-RCA. She has stopped taking ASA on a regular basis and I asked her to resume this once she gets through her upcoming surgeries. Otherwise she appears stable and can  proceed with surgery without further cardiac testing. Considering her history of spontaneous coronary dissection, it may be prudent to perform her surgery at [facility] in case she has any post-operative issues. The cardiology team will be available if any problems arise.  Overall I think her risk of perioperative cardiac complications is low.   Current medicines are reviewed with the patient today.  The patient does not have concerns regarding medicines.  Labs/ tests ordered today include:   Orders Placed This Encounter  Procedures  . EKG 12-Lead   Disposition:   FU one year  Signed, Sherren Mocha, MD  01/25/2015 9:31 AM    [facility] [place], [place], [place]  [address] Phone: [phone]; Fax: [phone]

## 2015-01-30 ENCOUNTER — Ambulatory Visit (INDEPENDENT_AMBULATORY_CARE_PROVIDER_SITE_OTHER): Payer: BLUE CROSS/BLUE SHIELD | Admitting: Family

## 2015-01-30 ENCOUNTER — Encounter: Payer: Self-pay | Admitting: Family

## 2015-01-30 ENCOUNTER — Other Ambulatory Visit (HOSPITAL_COMMUNITY)
Admission: RE | Admit: 2015-01-30 | Discharge: 2015-01-30 | Disposition: A | Discharge status: Home / Self Care | Payer: BLUE CROSS/BLUE SHIELD | Source: Ambulatory Visit | Attending: Family | Admitting: Family

## 2015-01-30 VITALS — BP 125/76 | HR 76 | Temp 98.1°F | Resp 16 | Ht 67.0 in | Wt 184.0 lb

## 2015-01-30 DIAGNOSIS — Z01419 Encounter for gynecological examination (general) (routine) without abnormal findings: Secondary | ICD-10-CM | POA: Diagnosis not present

## 2015-01-30 DIAGNOSIS — Z0000 Encounter for general adult medical examination without abnormal findings: Secondary | ICD-10-CM

## 2015-01-30 DIAGNOSIS — Z1151 Encounter for screening for human papillomavirus (HPV): Secondary | ICD-10-CM | POA: Insufficient documentation

## 2015-01-30 NOTE — Patient Instructions (Signed)
Please complete lab work prior to leaving.   

## 2015-01-30 NOTE — Progress Notes (Signed)
Pre visit review using our clinic review tool, if applicable. No additional management support is needed unless otherwise documented below in the visit note. 

## 2015-01-30 NOTE — Addendum Note (Signed)
Addended by: Kelle Darting A on: 01/30/2015 04:58 PM   Modules accepted: Orders

## 2015-01-30 NOTE — Assessment & Plan Note (Addendum)
Pap performed today.  Pt was commended on her exercise and weight loss.  Obtain routine labs (reviewed labs performed by cardiology).

## 2015-01-30 NOTE — Progress Notes (Signed)
Subjective:    Patient ID: [name], female    DOB: 06-13-1970, 45 y.o.   MRN: [id number]  HPI  [name] is a 45 yr old female who presents today for cpx.   Immunizations: tetanus up to date, flu shot 10/16 Diet: diet is healthy Exercise:  walking/jogging Colonoscopy: will  Begin at age 26 Pap Smear: 2013- normal.  Breast CA- DCIS right breast- pt plans for bilateral mastectomy. She is followed by [name] (surgery) and [name] (oncology). She has been cleared by her cardiologist for her upcoming surgery.   Hx of Thyroid CA- she is s/p thyroidectomy, maintained on synthroid, and followed by endo ([name]).   Review of Systems  Constitutional:       Has lost 50 pounds with diet and exercise  HENT: Positive for rhinorrhea. Negative for hearing loss.        Mild sore throat from drainage- feels like allergies  Eyes: Negative for visual disturbance.  Respiratory: Negative for cough and shortness of breath.   Cardiovascular: Negative for chest pain.  Gastrointestinal: Negative for diarrhea and constipation.  Genitourinary: Negative for dysuria and frequency.  Musculoskeletal: Negative for myalgias and arthralgias.  Skin: Negative for rash.  Neurological: Positive for tremors.       Occasional HA's  Psychiatric/Behavioral:       Denies depression/anxiety   Past Medical History  Diagnosis Date  . Hypothyroid   . Overweight(278.02)   . CAD (coronary artery disease)     open heart surgery 09/12/10  . History of thyroid cancer   . GERD (gastroesophageal reflux disease)   . Arthritis   . Heart disease   . Cancer [facility]) 2007    thyroid  . Breast cancer [facility]) 12/2014    Seeing [facility] doctors    Social History   Social History  . Marital Status: Married    Spouse Name: N/A  . Number of Children: 2  . 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 Smoking status: Never Smoker   .  Smokeless tobacco: Never Used  . Alcohol Use: Yes     Comment: rarely  . Drug Use: No  . Sexual Activity: Yes    Birth Control/ Protection: IUD   Other Topics Concern  . 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 Thyroidectomy    . Coronary artery bypass grafting x1  09/13/10    [place]  . Arm surgery      rt  . Ulnar nerve repair Right 1989, 2002  . Hernia repair  1977    inguinal, ?side    Family History  Problem Relation Age of Onset  . Hypertension Mother   . Colon polyps Mother   . Arthritis Mother   . Cancer Mother     breast  . Hypertension Father   . Hiatal hernia Father   . Hypertension Sister   . Coronary artery disease Maternal Grandfather   . Cancer Maternal Aunt     breast, ovarian  . Lupus Maternal Aunt   . Heart disease Maternal Aunt   . Diabetes Maternal  Aunt   . Diabetes Maternal Aunt   . Other Maternal Aunt     ?auto immune disease  . Colon cancer Neg Hx   . Arthritis Paternal Grandmother   . Diabetes Paternal Grandmother   . Parkinson's disease Paternal Grandmother     No Known Allergies  Current Outpatient Prescriptions on File Prior to Visit  Medication Sig Dispense Refill  . diphenhydrAMINE (BENADRYL) 25 mg capsule Take 25 mg by mouth at bedtime.     Marland Kitchen levonorgestrel (MIRENA) 20 MCG/24HR IUD 1 each by Intrauterine route once.      Marland Kitchen levothyroxine (SYNTHROID, LEVOTHROID) 150 MCG tablet TAKE 1 TABLET BY MOUTH EVERY MORNING 30 tablet 0  . aspirin 81 MG tablet Take 81 mg by mouth every morning. Reported on 01/30/2015     No current facility-administered medications on file prior to visit.    BP 125/76 mmHg  Pulse 76  Temp(Src) 98.1 F (36.7 C) (Oral)  Resp 16  Ht 5\' 7"  (1.702 m)  Wt 184 lb (83.462  kg)  BMI 28.81 kg/m2  SpO2 96%       Objective:   Physical Exam  Physical Exam  Constitutional: She is oriented to person, place, and time. She appears well-developed and well-nourished. No distress.  HENT:  Head: Normocephalic and atraumatic.  Right Ear: Tympanic membrane and ear canal normal.  Left Ear: Tympanic membrane and ear canal normal.  Mouth/Throat: Oropharynx is clear and moist.  Eyes: Pupils are equal, round, and reactive to light. No scleral icterus.  Neck: Normal range of motion. No thyromegaly present.  Cardiovascular: Normal rate and regular rhythm.   No murmur heard. Pulmonary/Chest: Effort normal and breath sounds normal. No respiratory distress. He has no wheezes. She has no rales. She exhibits no tenderness.  Abdominal: Soft. Bowel sounds are normal. He exhibits no distension and no mass. There is no tenderness. There is no rebound and no guarding.  Musculoskeletal: She exhibits no edema.  Lymphadenopathy:    She has no cervical adenopathy.  Neurological: She is alert and oriented to person, place, and time. She has normal patellar reflexes. She exhibits normal muscle tone. Coordination normal.  Skin: Skin is warm and dry.  Psychiatric: She has a normal mood and affect. Her behavior is normal. Judgment and thought content normal.  Breasts: Examined lying Inguinal/mons: Normal without inguinal adenopathy  External genitalia: Normal  BUS/Urethra/Skene's glands: Normal  Bladder: Normal  Vagina: Normal  Cervix: Normal (mirena string intact)  Some bloody discharge noted Uterus: normal in size, shape and contour. Midline and mobile  Adnexa/parametria:  Rt: Without masses or tenderness.  Lt: Without masses or tenderness.  Anus and perineum: Normal           Assessment & Plan:         Assessment & Plan:

## 2015-01-31 LAB — URINALYSIS, ROUTINE W REFLEX MICROSCOPIC
BILIRUBIN URINE: NEGATIVE
LEUKOCYTES UA: NEGATIVE
NITRITE: NEGATIVE
Specific Gravity, Urine: <=1.005 — AB (ref 1.000–1.030)
Total Protein, Urine: NEGATIVE
Urine Glucose: NEGATIVE
Urobilinogen, UA: 0.2 (ref 0.0–1.0)
WBC, UA: NONE SEEN (ref 0–?)
pH: 5.5 (ref 5.0–8.0)

## 2015-01-31 LAB — LIPID PANEL
CHOLESTEROL: 143 mg/dL (ref 0–200)
HDL: 46.4 mg/dL (ref >39.00)
LDL Cholesterol: 83 mg/dL (ref 0–99)
NonHDL: 96.94
Total CHOL/HDL Ratio: 3
Triglycerides: 70 mg/dL (ref 0.0–149.0)
VLDL: 14 mg/dL (ref 0.0–40.0)

## 2015-01-31 LAB — TSH: TSH: 1.38 u[IU]/mL (ref 0.35–4.50)

## 2015-02-05 ENCOUNTER — Encounter: Payer: Self-pay | Admitting: Oncology

## 2015-02-05 LAB — CYTOLOGY - PAP

## 2015-02-06 ENCOUNTER — Telehealth: Payer: Self-pay | Admitting: Family

## 2015-02-06 ENCOUNTER — Other Ambulatory Visit: Payer: Self-pay | Admitting: Oncology

## 2015-02-06 DIAGNOSIS — R3129 Other microscopic hematuria: Secondary | ICD-10-CM

## 2015-02-06 NOTE — Telephone Encounter (Signed)
+   hemoglobin in urinalysis.  Advise pt to repeat UA with micro in 2 weeks, dx hemoglobinuria. Cholesterol and thyroid look good. Pap normal, HPV testing negative.

## 2015-02-06 NOTE — Telephone Encounter (Signed)
Notified pt and she voices understanding. Lab appt scheduled for 02/20/15 at 7:15am, future orders entered.

## 2015-02-20 ENCOUNTER — Encounter: Payer: Self-pay | Admitting: Cardiovascular Disease

## 2015-02-20 ENCOUNTER — Encounter: Payer: Self-pay | Admitting: Oncology

## 2015-02-20 ENCOUNTER — Other Ambulatory Visit (INDEPENDENT_AMBULATORY_CARE_PROVIDER_SITE_OTHER): Payer: BLUE CROSS/BLUE SHIELD

## 2015-02-20 DIAGNOSIS — R3129 Other microscopic hematuria: Secondary | ICD-10-CM

## 2015-02-21 ENCOUNTER — Ambulatory Visit (INDEPENDENT_AMBULATORY_CARE_PROVIDER_SITE_OTHER): Payer: BLUE CROSS/BLUE SHIELD | Admitting: Endocrinology

## 2015-02-21 ENCOUNTER — Other Ambulatory Visit: Payer: Self-pay | Admitting: *Deleted

## 2015-02-21 ENCOUNTER — Encounter: Payer: Self-pay | Admitting: Family

## 2015-02-21 ENCOUNTER — Encounter: Payer: Self-pay | Admitting: Endocrinology

## 2015-02-21 VITALS — BP 122/76 | HR 81 | Temp 98.2°F | Ht 67.0 in | Wt 181.0 lb

## 2015-02-21 DIAGNOSIS — C73 Malignant neoplasm of thyroid gland: Secondary | ICD-10-CM | POA: Diagnosis not present

## 2015-02-21 LAB — URINALYSIS, ROUTINE W REFLEX MICROSCOPIC
Bilirubin Urine: NEGATIVE
GLUCOSE, UA: NEGATIVE
HGB URINE DIPSTICK: NEGATIVE
Ketones, ur: NEGATIVE
LEUKOCYTES UA: NEGATIVE
Nitrite: NEGATIVE
PH: 5.5 (ref 5.0–8.0)
Protein, ur: NEGATIVE
Specific Gravity, Urine: 1.023 (ref 1.001–1.035)

## 2015-02-21 LAB — URINALYSIS, MICROSCOPIC ONLY
CASTS: NONE SEEN [LPF]
YEAST: NONE SEEN [HPF]

## 2015-02-21 NOTE — Progress Notes (Signed)
Subjective:    Patient ID: [name], female    DOB: August 23, 1970, 45 y.o.   MRN: [id number]  HPI Pt returns for f/u of stage 1 papillary adenocarcinoma of the thyroid 2006: thyroidectomy at [place]; pathol showed 2.7 cm right lobe tumor (T2 N0 M0).   2007: adjuvant i-131 rx at [facility] ([facility]); pt says post-therapy scan was negative.   2007-2014: pt says her "cancer blood test" has been neg also.   10/14: TG undetectable (ab neg).   11/15: TG undetectable (ab neg).   Interval history: She does not notice any nodule at the neck.   She says synthroid was decreased, with the goal of a normal tsh, because of her long disease-free interval.  pt states she feels well in general.   Past Medical History  Diagnosis Date  . Hypothyroid   . Overweight(278.02)   . CAD (coronary artery disease)     open heart surgery 09/12/10  . History of thyroid cancer   . GERD (gastroesophageal reflux disease)   . Arthritis   . Heart disease   . Cancer [facility]) 2007    thyroid  . Breast cancer [facility]) 12/2014    Seeing [facility] doctors    Past Surgical History  Procedure Laterality Date  . Thyroidectomy    . Coronary artery bypass grafting x1  09/13/10    [place]  . Arm surgery      rt  . Ulnar nerve repair Right 1989, 2002  . Hernia repair  1977    inguinal, ?side    Social History   Social History  . Marital Status: Married    Spouse Name: N/A  . Number of Children: 2  . 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 Smoking status: Never Smoker   . Smokeless tobacco: Never Used  . Alcohol Use: Yes     Comment: rarely  . Drug Use: No  . Sexual Activity: Yes    Birth Control/ Protection: IUD   Other Topics Concern  . 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 diphenhydrAMINE (BENADRYL) 25 mg capsule Take 25 mg by mouth at bedtime.     Marland Kitchen levonorgestrel (MIRENA) 20 MCG/24HR IUD 1 each by Intrauterine route once.      Marland Kitchen levothyroxine (SYNTHROID, LEVOTHROID) 150 MCG tablet TAKE 1 TABLET BY MOUTH EVERY MORNING 30 tablet 0  . aspirin 81 MG tablet Take 81 mg by mouth every morning. Reported on 02/21/2015    . omeprazole (PRILOSEC) 20 MG capsule Take 20 mg by mouth daily. Reported on 02/21/2015     No current facility-administered medications on file prior to visit.    No Known Allergies  Family History  Problem Relation Age of Onset  . Hypertension Mother   . Colon polyps Mother   . Arthritis Mother   . Cancer Mother     breast  . Hypertension Father   . Hiatal hernia Father   . Hypertension Sister   . Coronary artery disease Maternal Grandfather   . Cancer Maternal Aunt  breast, ovarian  . Lupus Maternal Aunt   . Heart disease Maternal Aunt   . Diabetes Maternal Aunt   . Diabetes Maternal Aunt   . Other Maternal Aunt     ?auto immune disease  . Colon cancer Neg Hx   . Arthritis Paternal Grandmother   . Diabetes Paternal Grandmother   . Parkinson's disease Paternal Grandmother     BP 122/76 mmHg  Pulse 81  Temp(Src) 98.2 F (36.8 C) (Oral)  Ht 5\' 7"  (1.702 m)  Wt 181 lb (82.101 kg)  BMI 28.34 kg/m2  SpO2 96%  Review of Systems No weight change    Objective:   Physical Exam VITAL SIGNS:  See vs page GENERAL: no distress Neck: a healed scar is present.  i do not appreciate a nodule in the thyroid or elsewhere in the neck.    Lab Results  Component Value Date   TSH 1.38 01/30/2015   T4TOTAL 5.3 09/13/2010   TG=undetectable    Assessment & Plan:  Differentiated thyroid cancer, no evidence of recurrence.  Postsurgical hypothyroidism: well-replaced.    Patient is advised the following: Patient  Instructions  blood tests are requested for you today.  We'll let you know about the results.  Please return in 2 years.

## 2015-02-21 NOTE — Patient Instructions (Addendum)
blood tests are requested for you today.  We'll let you know about the results.   Please return in 2 years.   

## 2015-02-22 ENCOUNTER — Telehealth: Payer: Self-pay | Admitting: Oncology

## 2015-02-22 ENCOUNTER — Other Ambulatory Visit: Payer: Self-pay | Admitting: *Deleted

## 2015-02-22 LAB — THYROGLOBULIN ANTIBODY

## 2015-02-22 LAB — THYROGLOBULIN LEVEL

## 2015-02-22 NOTE — Telephone Encounter (Signed)
per pof to sch pt appt-cld & spoke to pt and gave pt time & date of appt °

## 2015-03-01 ENCOUNTER — Ambulatory Visit: Payer: BLUE CROSS/BLUE SHIELD | Admitting: Cardiovascular Disease

## 2015-03-02 ENCOUNTER — Telehealth: Payer: Self-pay | Admitting: Oncology

## 2015-03-02 NOTE — Telephone Encounter (Signed)
Patient left message regarding rescheduling 3/10 f/u with GM to the following week due to having a post op appointment at [facility] the same day. Due to provider availability checked with desk nurse re scheduling patient for 3/24 - this will be ok per desk nurse. Return call to patient informing her that GM is out of office the week of 3/17 and the next availability would be 3/24 - asked that patient call me back directly to let me know if she is ok with being rescheduled to 3/27. Reschedule pending return call from patient.

## 2015-03-04 ENCOUNTER — Other Ambulatory Visit: Payer: Self-pay | Admitting: Endocrinology

## 2015-03-05 ENCOUNTER — Ambulatory Visit: Payer: BLUE CROSS/BLUE SHIELD | Admitting: Oncology

## 2015-03-08 ENCOUNTER — Ambulatory Visit: Payer: BLUE CROSS/BLUE SHIELD | Admitting: Oncology

## 2015-03-23 ENCOUNTER — Ambulatory Visit: Payer: BLUE CROSS/BLUE SHIELD | Admitting: Oncology

## 2015-03-31 ENCOUNTER — Other Ambulatory Visit: Payer: Self-pay | Admitting: Endocrinology

## 2015-04-04 ENCOUNTER — Telehealth: Payer: Self-pay | Admitting: Oncology

## 2015-04-04 ENCOUNTER — Ambulatory Visit (HOSPITAL_BASED_OUTPATIENT_CLINIC_OR_DEPARTMENT_OTHER): Payer: BLUE CROSS/BLUE SHIELD | Admitting: Oncology

## 2015-04-04 ENCOUNTER — Other Ambulatory Visit: Payer: Self-pay | Admitting: Oncology

## 2015-04-04 VITALS — BP 111/71 | HR 87 | Temp 97.5°F | Resp 18 | Ht 67.0 in | Wt 181.2 lb

## 2015-04-04 DIAGNOSIS — C50311 Malignant neoplasm of lower-inner quadrant of right female breast: Secondary | ICD-10-CM

## 2015-04-04 DIAGNOSIS — D0511 Intraductal carcinoma in situ of right breast: Secondary | ICD-10-CM | POA: Diagnosis not present

## 2015-04-04 DIAGNOSIS — C73 Malignant neoplasm of thyroid gland: Secondary | ICD-10-CM

## 2015-04-04 DIAGNOSIS — Z170 Estrogen receptor positive status [ER+]: Secondary | ICD-10-CM | POA: Diagnosis not present

## 2015-04-04 DIAGNOSIS — Q999 Chromosomal abnormality, unspecified: Secondary | ICD-10-CM

## 2015-04-04 DIAGNOSIS — Z85850 Personal history of malignant neoplasm of thyroid: Secondary | ICD-10-CM

## 2015-04-04 NOTE — Telephone Encounter (Signed)
Left vm to inform patient of dermatology appt 3/29 at 230 pm Dr Renda Rolls

## 2015-04-04 NOTE — Telephone Encounter (Signed)
appt made per 3/22 pof. MRI to be scheduled by central radiology

## 2015-04-04 NOTE — Progress Notes (Signed)
[name]  Telephone:(336) [phone] Fax:(336) [phone]     ID: [name] DOB: May 14, 1970  MR#: [id number]  KKX#:[id number]  Patient Care Team: Midge Minium, MD as PCP - General (Family Medicine) Kathyrn Lass, MD (Family Medicine) Rolm Bookbinder, MD as Consulting Physician (General Surgery) Irene Limbo, MD as Consulting Physician (Plastic Surgery) Sherren Mocha, MD as Consulting Physician (Cardiology) Devra Dopp, MD as Referring Physician (Dermatology) Jacinto Reap, MD (Plastic Surgery) PCP: Annye Asa, MD GYN: SU:  OTHER MD:  CHIEF COMPLAINT: Right-sided ductal carcinoma in situ, estrogen receptor positive  CURRENT TREATMENT: s/p DIEP flap reconstruction after bilateral mastectomies 03/09/2015   BREAST CANCER HISTORY: From the prior intake note:  [name] had bilateral screening mammography at a mobile clinic 11/16/2014. This showed a focal density in the lower inner quadrant of the right breast and she was recalled for diagnostic right mammography and right breast ultrasonography at the [facility] 11/28/2014. This showed the breast density to be category B. In the lower inner right breast there was a group of amorphous calcifications spanning approximately 3 cm. These were new as compared to mammography from 2014. Ultrasound of the area showed an ill-defined region of hypoechoic tissue but no definite mass.  Biopsy of the right breast mass in question 12/05/2014 showed (SF [id number]) ductal carcinoma in situ, grade 1, arising within an intraductal papilloma. The cells were estrogen receptor 9200% positive, progesterone receptor 80-90% positive, both with strong staining intensity.  The patient's subsequent history is as detailed below.  INTERVAL HISTORY: [name] today for follow-up of her ductal carcinoma in situ. Since her last visit here she went to [facility] where she underwent bilateral mastectomies  with right-sided sentinel lymph node sampling and immediate D IEP reconstruction per 20 05/03/2015. The pathology on the left was benign. On the right side there was a 2.8 cm area of ductal carcinoma in situ, estrogen and progesterone receptor positive. Margins were negative. The single right-sided sentinel lymph node was clear.  She did well with the surgery, although she did need of one unit of packed red cells postop. She is currently using only ibuprofen for pain. He has been no significant bleeding or fever issues.  REVIEW OF SYSTEMS: She is still limited as far as which activities she is able to do. She is mostly walking for exercise. She cannot lift more than 5 pounds currently. Because of the abdominal incision she is still a little bit "hunched over" when she walks. She has had no problems with bleeding except for pulling a Steri-Strip yesterday which disturbed a scab and that caused  A little bit of serous sanguinous material to show up on the 4 x 4 she put over it when she removed. Aside from these issues a detailed review of systems today was noncontributory  PAST MEDICAL HISTORY: Past Medical History  Diagnosis Date  . Hypothyroid   . Overweight(278.02)   . CAD (coronary artery disease)     open heart surgery 09/12/10  . History of thyroid cancer   . GERD (gastroesophageal reflux disease)   . Arthritis   . Heart disease   . Cancer [facility]) 2007    thyroid  . Breast cancer [facility]) 12/2014    Seeing [facility] doctors    PAST SURGICAL HISTORY: Past Surgical History  Procedure Laterality Date  . Thyroidectomy    . Coronary artery bypass grafting x1  09/13/10    [place] [facility]  . Arm surgery      rt  .  Ulnar nerve repair Right 1989, 2002  . Hernia repair  1977    inguinal, ?side    FAMILY HISTORY Family History  Problem Relation Age of Onset  . Hypertension Mother   . Colon polyps Mother   . Arthritis Mother   . Cancer Mother     breast  . Hypertension Father   . Hiatal hernia  Father   . Hypertension Sister   . Coronary artery disease Maternal Grandfather   . Cancer Maternal Aunt     breast, ovarian  . Lupus Maternal Aunt   . Heart disease Maternal Aunt   . Diabetes Maternal Aunt   . Diabetes Maternal Aunt   . Other Maternal Aunt     ?auto immune disease  . Colon cancer Neg Hx   . Arthritis Paternal Grandmother   . Diabetes Paternal Grandmother   . Parkinson's disease Paternal Grandmother   the patient's parents are in their late 76s as of December 2016. The patient's mother was diagnosed with breast cancer age 40; on the maternal side, the maternal grandmother was diagnosed with uterine cancer at age 73. 2 maternal aunts had one breast cancer and one uterine cancer and one maternal uncle had colon cancer is were in their 3s and 27s. On the father's side there is an uncle with colon cancer age 75. There is no history of ovarian cancer to the patient's knowledge   GYNECOLOGIC HISTORY:  No LMP recorded. Patient is not currently having periods (Reason: IUD). Menarche age 66, first live birth age 53. The patient is GX P2. She still having regular periods. She has a Mirena IUD in place.  SOCIAL HISTORY:  [name] works as a Dealer. Her husband [name] is an Brewing technologist. There is son [name] is a Equities trader at [employer]. Their daughter [name] is a Museum/gallery exhibitions officer at [place]: Not in place   HEALTH MAINTENANCE: Social History  Substance Use Topics  . Smoking status: Never Smoker   . Smokeless tobacco: Never Used  . Alcohol Use: Yes     Comment: rarely     Colonoscopy:  PAP: 2013  Bone density:  Lipid panel:  No Known Allergies  Current Outpatient Prescriptions  Medication Sig Dispense Refill  . aspirin 81 MG tablet Take 81 mg by mouth every morning. Reported on 02/21/2015    . diphenhydrAMINE (BENADRYL) 25 mg capsule Take 25 mg by mouth at bedtime.     Marland Kitchen levonorgestrel (MIRENA) 20  MCG/24HR IUD 1 each by Intrauterine route once.      Marland Kitchen levothyroxine (SYNTHROID, LEVOTHROID) 150 MCG tablet TAKE 1 TABLET BY MOUTH EVERY MORNING 30 tablet 0  . omeprazole (PRILOSEC) 20 MG capsule Take 20 mg by mouth daily. Reported on 02/21/2015     No current facility-administered medications for this visit.    OBJECTIVE: Middle-aged white woman who appears well Filed Vitals:   04/04/15 1007  BP: 111/71  Pulse: 87  Temp: 97.5 F (36.4 C)  Resp: 18     Body mass index is 28.37 kg/(m^2).    ECOG FS:1 - Symptomatic but completely ambulatory  Sclerae unicteric, pupils round and equal Oropharynx clear and moist-- no thrush or other lesions No cervical or supraclavicular adenopathy Lungs no rales or rhonchi Heart regular rate and rhythm Abd soft, nontender, positive bowel sounds; the abdominal incision cutting across from right to left is healing nicely, with the glue still in place; there is no evidence of dehiscence,  swelling, or erythema MSK no focal spinal tenderness, no upper extremity lymphedema Neuro: nonfocal, well oriented, appropriate affect Breasts: status post bilateral mastectomies with  Bilateral D IEP reconstruction. There is no evidence of necrosis. The cosmetic result is excellent. There is no dehiscence, erythema, or swelling. Both axillae are benign.she is next 180 this looks much worse. The problem is to start her today's current medicines trouble with insurance so we will have to put it off and that's okay S 100 She Had Back Pain She    LAB RESULTS:  CMP     Component Value Date/Time   NA 139 12/18/2014 1555   NA 135 02/12/2014 1740   K 4.0 12/18/2014 1555   K 3.5 02/12/2014 1740   CL 108 02/12/2014 1740   CO2 24 12/18/2014 1555   CO2 24 02/12/2014 1740   GLUCOSE 96 12/18/2014 1555   GLUCOSE 100* 02/12/2014 1740   BUN 15.2 12/18/2014 1555   BUN 10 02/12/2014 1740   CREATININE 1.0 12/18/2014 1555   CREATININE 0.85 02/12/2014 1740   CALCIUM 9.6 12/18/2014  1555   CALCIUM 9.1 02/12/2014 1740   PROT 6.7 12/18/2014 1555   PROT 7.1 09/22/2011 1048   ALBUMIN 3.9 12/18/2014 1555   ALBUMIN 4.0 09/22/2011 1048   AST 27 12/18/2014 1555   AST 23 09/22/2011 1048   ALT 31 12/18/2014 1555   ALT 29 09/22/2011 1048   ALKPHOS 88 12/18/2014 1555   ALKPHOS 111 09/22/2011 1048   BILITOT 1.31* 12/18/2014 1555   BILITOT 1.1 09/22/2011 1048   GFRNONAA 82* 02/12/2014 1740   GFRAA >90 02/12/2014 1740    INo results found for: SPEP, UPEP  Lab Results  Component Value Date   WBC 8.2 12/18/2014   NEUTROABS 5.9 12/18/2014   HGB 13.3 12/18/2014   HCT 39.6 12/18/2014   MCV 87.9 12/18/2014   PLT 283 12/18/2014      Chemistry      Component Value Date/Time   NA 139 12/18/2014 1555   NA 135 02/12/2014 1740   K 4.0 12/18/2014 1555   K 3.5 02/12/2014 1740   CL 108 02/12/2014 1740   CO2 24 12/18/2014 1555   CO2 24 02/12/2014 1740   BUN 15.2 12/18/2014 1555   BUN 10 02/12/2014 1740   CREATININE 1.0 12/18/2014 1555   CREATININE 0.85 02/12/2014 1740      Component Value Date/Time   CALCIUM 9.6 12/18/2014 1555   CALCIUM 9.1 02/12/2014 1740   ALKPHOS 88 12/18/2014 1555   ALKPHOS 111 09/22/2011 1048   AST 27 12/18/2014 1555   AST 23 09/22/2011 1048   ALT 31 12/18/2014 1555   ALT 29 09/22/2011 1048   BILITOT 1.31* 12/18/2014 1555   BILITOT 1.1 09/22/2011 1048       No results found for: LABCA2  No components found for: LABCA125  No results for input(s): INR in the last 168 hours.  Urinalysis    Component Value Date/Time   COLORURINE DARK YELLOW 02/20/2015 0705   APPEARANCEUR TURBID* 02/20/2015 0705   LABSPEC 1.023 02/20/2015 0705   PHURINE 5.5 02/20/2015 0705   GLUCOSEU NEGATIVE 02/20/2015 0705   GLUCOSEU NEGATIVE 01/30/2015 1428   HGBUR NEGATIVE 02/20/2015 0705   BILIRUBINUR NEGATIVE 02/20/2015 0705   KETONESUR NEGATIVE 02/20/2015 0705   PROTEINUR NEGATIVE 02/20/2015 0705   UROBILINOGEN 0.2 01/30/2015 1428   NITRITE NEGATIVE  02/20/2015 0705   LEUKOCYTESUR NEGATIVE 02/20/2015 0705    STUDIES: No results found.  Image Cytometry  Case: [id number]                                Authorizing Provider:  Hulan Amato, MD    Collected:           03/09/2015 1200             Ordering Location:     [facility] General Surgery  Received:            03/15/2015 [place]             Pathologist:           Lelon Frohlich, MD                                                     Specimen:    Breast, Right, [id number]                                                                Tissue Type  Right breast containing cribriform and papillary type ductal carcinoma in situ, involving an intraductal papilloma  ([id number], Block D8; Cold Ischemia Time:  5 hours, 55 minutes, Formalin Fixation Time:  44 hours, 40 minutes).   INTERPRETATION  ER INTERPRETATION: POSITIVE ESTROGEN RECEPTOR ACTIVITY (ALLRED SCORE = 8).  PR INTERPRETATION: POSITIVE PROGESTERONE RECEPTOR ACTIVITY (ALLRED SCORE = 8).    Estrogen Receptor IHC Analysis  Microscopic Description:  Approximately 90% of the tumor cells exhibit 3+/strong nuclear estrogen receptor expression (Allred score 5 + 3 = 8).  Benign ductal epithelium stains positively (strong) and serves as a benign positive internal control.   Progesterone Receptor IHC Analysis  Microscopic Description:  Approximately 90% of the tumor cells exhibit 3+/strong nuclear progesterone receptor expression (Allred score 5 + 3 = 8).  Benign ductal epithelium stains positively (strong) and serves as a benign positive internal control.   Methodology  PRE-ANALYTIC VARIABLES:  Specimens should be immersed in formalin within 1 hour of the biopsy or resection procedure (less than 1 hour cold ischemia time).  Specimens are fixed in 10% neutral buffered formalin for at least 6 hours up to 72 hours. For ER and PR negative cases outside these limits, and with absent internal control staining,  repeat testing on a different specimen is recommended.  DAKO ER/PR PHARMDX METHODOLOGY:   The ER/PR pharmDx kit was used exactly as specified in the manufacturer's directions for use on the [employer].  A proportion score (PS) is given to represent the proportion of positive staining cells (range 0-5).  An intensity score (IS) is given to represent the estimated average staining intensity of positive cells (range 0-3).  A total score (TS) is obtained from the sum of the PS and IS (range 0-8).  A positive result is determined to be a TS of greater than or equal to 3.   Attestation All of the diagnostic evaluations on the enumerated specimens have been personally conducted by the pathologists involved in the care of this patient as indicated by the electronic signatures above.    Specimen  Tissue-Pathology -  Breast, Right  Surgical Pathology                                Case: [id number]                                Authorizing Provider:  Josie Dixon,   Collected:           03/09/2015 6195                                    MD                                                                          Ordering Location:     [facility]    Received:            03/09/2015 1630                                    Periop                                                                      Pathologist:           Hulan Amato, MD                                                     Intraop:               Verdis Frederickson, MD                                                           Specimens:   A) - Breast, Left, left breast mastectomy with short stitch marking superior and long              stitch marking lateral                                                                             B) - Breast, Left, left breast additional anterior margin  C) - Lymph Node, Sentinel, right axillary sentinal lymph nodes                                      D) - Breast, Right, right breast mastectomy with short stich marking superior and                  long stich marking lateral                                                                         E) - Breast, Right, right breast additional anterior margin                           DIAGNOSIS   A. Left breast, simple mastectomy:  Nipple, skin and benign breast tissue.   B. Left breast additional anterior margin, excision:  Benign fibroadipose tissue.   C. Right axillary sentinel lymph node, excision:  One lymph node with no evidence of malignancy (0/1).   D. Right breast, simple mastectomy:  Ductal carcinoma in situ (DCIS, 2.8 cm) involving an intraductal papilloma and with negative margins of resection (closest approach 2.3 mm to anterior margin in current specimen).  Nipple and skin uninvolved.  No evidence of invasive carcinoma.   E. Right breast additional anterior margin, excision:  Benign fibroadipose tissue. Addendum Breast biomarkers are pending on block D8 with results to be reported separately by the Image Cytometry laboratory. Synoptic Report  DCIS OF THE BREAST: Complete Excision (Less Than Total Mastectomy, Including Specimens Designated Biopsy, Lumpectomy, Quadrantectomy, and Partial Mastectomy; With or Without Axillary Contents) and Mastectomy (Total, Modified Radical, Radical; With or Without Axillary Contents)  (Breast DCIS - C, D, E)   SPECIMEN    Procedure:    Total mastectomy (including nipple and skin)    Lymph Node Sampling:    Sentinel lymph node(s)    Specimen Laterality:    Right      Specify Clock Position of Tumor Site:    2 o'clock      Specify Clock Position of Tumor Site:    3 o'clock  TUMOR    Size (Extent) of DCIS:          Estimated Size (extent) of DCIS (greatest dimension using gross and microscopic evaluation) is at Least (mm):    28 mm    Histologic Type:    Ductal carcinoma in situ. Classified as Tis (DCIS) or Tis ([place])     Architectural Patterns:    Cribriform    Architectural Patterns:    Papillary    Nuclear Grade:    Grade II (intermediate)    Necrosis:    Present, focal (small foci or single cell necrosis)  MARGINS    MARGINS:    Margins uninvolved by DCIS      Distance of DCIS from Closest Margin (mm):    Distance (specify in mm): 2.3 mm        Closest Uninvolved Margin:    Anterior        Closest Uninvolved Margin:    Other (specify margin):  (reference specimen E for additional  anterior margin)  ACCESSORY FINDINGS    Treatment Effect: Response to Presurgical (Neoadjuvant) Therapy:    No known presurgical therapy    Microcalcifications:    Present in DCIS  LYMPH NODES    Status of Lymph Nodes:            Total Number of Lymph Nodes Examined (sentinel and nonsentinel):    Specify: 1        Micro / Macro Metastases:    Not identified          Number of Lymph Nodes with Isolated Tumor Cells (<= 0.2 mm and <= 200 cells):    None        Status of Sentinel Nodes:              Number of Sentinel Lymph Nodes Examined:    Specify: 1          Method of Evaluation of Sentinel Lymph Nodes:    H&E, multiple levels  STAGE (pTNM)    TNM Descriptors:    Not applicable    Primary Tumor (pT):    pTis (DCIS): Ductal carcinoma in situ    Modifier:    (sn): Only sentinel node(s) evaluated. If 6 or more nodes (sentinel or nonsentinel) are removed, this modifier should not be used.    Category (pN):    pN0: No regional lymph node metastasis identified histologically    Distant Metastasis (pM):    Not applicable  ADDITIONAL NON-TUMOR    Additional Pathologic Findings  (specify):    prior biopsy site changes are noted Clinical Information As Per [facility]: 45 year old female with history of right breast ductal carcinoma in-situ. Gross Examination  A. "Left breast mastectomy"  Collected at 9:04 am on 03/09/15. Received fresh and placed in formalin at 4:20 pm on 03/09/15. Expected time out of formalin: 1 pm on  03/11/15.  Procedure:  Skin sparing simple mastectomy  Specimen orientation: Short stitch superior, long stitch lateral  Specimen weight: 408 grams.  Specimen dimensions: Medial to Lateral: 19.2 cm Anterior to Posterior: 2.5 cm Superior to Inferior: 16.5 cm  Skin dimensions: 4.5 x 2.8 cm Nipple/areola diameter: 1.7/3.5 cm  Skeletal muscle: No  Margins inked: Anterior:  Blue Posterior:  Black  Sectioned:  Medial to lateral in sagittal plane Gross findings: Multiple cross sections displays a predominantly fat lobulated cut surface with areas of dense white fibrous stroma with a fat to stroma ratio of 80:20.  No distinct masses or lesions are grossly identified.    Sectioned specimen radiographed?: No  Axillary tail: The axillary tail is not present and on examination of the most lateral aspect of the specimen no distinct palpable lymph nodes are grossly identified.    Special studies prospectively ordered: No Representative tissue collected for the Biospecimen Repository and Processing Core under [facility] [id number]?: No Sections submitted in blocks A1- A5.   Block Summary: A1- skin and nipple A2- upper outer quadrant A3-4- lower inner quadrant A5- lower outer quadrant  [GB5;NMG13;v:1.1]  B. "Left additional anterior margin", received fresh and placed in formalin on 03/09/15 at 4:20 pm is a 7.8 x 5.2 x 1.4 cm aggregate of yellow-tan fibrofatty breast tissue.  The fragments are unoriented and inked in blue.  Representative sections are submitted in blocks B1-10.  C. "Right axillary sentinel lymph node (FS3.1)", received fresh for frozen section and placed in formalin on 03/09/15 at 10:20 am is a 2 x 1.7 x 1.0 cm yellow-tan portion of adipose tissue.  On  examination a 1.1 x 1.0 x 0.4 cm lymph node is identified.  The lymph node is submitted for frozen section in FS3.1.  The frozen section remnant C1.  D. "Right breast mastectomy"  Collected at 10:25 am on 03/09/15. Received  fresh and placed in formalin at 4:20 pm on 03/09/15. Expected time out of formalin: 1 pm on 03/11/15.  Procedure:  Skin sparing simple mastectomy  Specimen orientation: Short stitch superior, long stitch lateral  Specimen weight: 313 grams.  Specimen dimensions: Medial to Lateral: 19 cm Anterior to Posterior: 2.8 cm Superior to Inferior: 16 cm  Nipple/areola diameter: 1/5/4.0 cm Skin lesions: Absent  Skeletal muscle: No  Margins inked: Anterior:  Blue Posterior:  Black  Sectioned:  Medial to lateral in sagittal plane Gross findings: The specimen is sectioned to reveal a 2.8 x 2.2 x 1.4 cm pale tan firm area of fibrosis.  A subtle scarred appearance is also noted.  Within the fibrotic tissue is a silver metallic top hat clip.  The remaining breast tissue is predominantly fat lobulated with areas of dense white fibrous stroma with a fat to stroma ratio of 80:20.  No additional lesions are grossly identified.    Gross tumor size:  Tumor #1: 2.8 x 2.2 x 1.4 cm Location of the tumor:  Tumor #1: 2-3 o'clock Multifocal tumor: No Closest gross surgical margin:  Tumor #1: Anterior superior margin Distance to closest gross margin:  Tumor #1: 0.2 cm  Sectioned specimen radiographed?: Yes Radiograph findings: Mass: No Microcalcifications: No Biopsy site microclip: Yes (1)  Axillary tail: The axillary tail is not present and on examination of the most lateral aspect of the specimen no distinct palpable lymph nodes are grossly identified.   Special studies prospectively ordered: No Representative tissue collected for the Biospecimen Repository and Processing Core under [facility] [id number]?: No Sections submitted in blocks D1- D14.   Block Summary: D1- skin and nipple D2-10- fibrotic area at 2-3:00  D11-14- representative quadrant sections as follows:    D11- upper inner   D12- upper outer   D13- lowe router    D14- lower inner    [GB5;NMG13;v:1.1]    E. "Right breast  additional anterior margin", received fresh and placed in formalin on 03/09/15 at 4:20 pm is a 9.4 x 9.4 x 2.0 cm aggregate of yellow-tan fibrofatty breast tissue.  The specimen of the tissue is inked in blue.  Multiple cross sections displays a yellow-tan fibrofatty cut surface with no distinct masses or lesions grossly identified.  Representative sections are submitted in blocks E1-10.  M. Vazquez/Dr. Madden Intraoperative Consultation C. "Right axillary sentinel lymph nodes":FS3.1 (one lymph node, bisected)-lymph node negative for carcinoma (Dr. Merleen Nicely).    ASSESSMENT: 45 y.o. 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  The patient does not carry theCHEK2  Deletion present in her mother.  (a)  Patients who carry a  CDKN2A mutation  Are at high risk of melanoma  (28-76% lifetime risk) and pancreatic cancer (17% lifetime risk).  (2)  Status post bilateral mastectomies with immediate D IEP reconstruction every 20 05/03/2015 showing  (a)  On the left side, benign breast tissue  (b)  On the right side, pTis pN0, stage 0 DCIS  measuring, 2.8 cm, estrogen receptor and progesterone receptor  positive,   (3)  Screening for melanoma with by annual skin exam is recommended   (4) screening for pancreatic cancer will consist of by annual MRI,  with a baseline ERCP   PLAN:  I spent approximately 50 minutes with Hoyle Sauer today reviewing the results of her breast surgery. She is aware that the cure rate for ductal carcinoma in situ after mastectomy approaches 100%. In her case specifically the lymph node was negative and margins were negative also. Even though her ductal carcinoma in situ was estrogen receptor  positive, I see no reason for her to receive anti-estrogens and she is basically "done" as far as her breast cancer is concerned.  We then discussed the genetic mutation she carries which is different from her mother's. The particular mutation that was uncovered in her case which are at significant risk of pancreatic cancer and melanoma. 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  Screening for melanoma is relatively straightforward and she already has a dermatologist, Dr. However stalk. My suggestion would be that she have complete skin exams twice a year. She will also need yearly retinal exams.  As far as pancreatic cancer is concerned the situation is more complicated. The need for screening as there but the optimal screening strategy has not been well-defined. We discussed waiting for symptoms or following the CA 19, both of which are likely to result in a creatinine cancer being advanced by the time it is discovered. We then discussed CT scans, MRIs, and ERCPs. The greater advantage of MRI is that it does not involve any radiation. It also gives very good images of the pancreas area. To me that would be the preferred screening radiology alternative.  ERCP is an invasive procedure that can cause pancreatitis and other side effects. I would suggest obtaining it at baseline and then repeating it only as needed in the future.  Danetta is in agreement with this plan. She will have her first abdominal MRI in May after she recovers sufficiently from her recent surgery. She will have the ERCP approximately at the same time. She will see me shortly thereafter. She knows to call for any problems that may develop before then.   Chauncey Cruel, MD   04/04/2015 10:39 AM Medical Oncology and Hematology [facility] [address] [place], [place] [address] Tel. [phone]    Fax. [phone]

## 2015-04-24 ENCOUNTER — Ambulatory Visit (HOSPITAL_COMMUNITY)
Admission: RE | Admit: 2015-04-24 | Discharge: 2015-04-24 | Disposition: A | Discharge status: Home / Self Care | Payer: BLUE CROSS/BLUE SHIELD | Source: Ambulatory Visit | Attending: Oncology | Admitting: Oncology

## 2015-04-24 DIAGNOSIS — R160 Hepatomegaly, not elsewhere classified: Secondary | ICD-10-CM | POA: Diagnosis not present

## 2015-04-24 DIAGNOSIS — C73 Malignant neoplasm of thyroid gland: Secondary | ICD-10-CM | POA: Insufficient documentation

## 2015-04-24 DIAGNOSIS — K8020 Calculus of gallbladder without cholecystitis without obstruction: Secondary | ICD-10-CM | POA: Insufficient documentation

## 2015-04-24 DIAGNOSIS — C50311 Malignant neoplasm of lower-inner quadrant of right female breast: Secondary | ICD-10-CM | POA: Insufficient documentation

## 2015-04-24 MED ORDER — GADOBENATE DIMEGLUMINE 529 MG/ML IV SOLN
20.0000 mL | Freq: Once | INTRAVENOUS | Status: AC | PRN
  Administered 2015-04-24: 17 mL via INTRAVENOUS

## 2015-05-01 ENCOUNTER — Telehealth: Payer: Self-pay | Admitting: *Deleted

## 2015-05-01 NOTE — Telephone Encounter (Signed)
Call received from [facility] the [place] Nurse with [facility] Case Management [phone]).  Request treatment plan for this patient.  Offered to fax 04-04-2015 office note but reports her fax isn't clear.  Read office note plan and next F/U scheduled for 05-22-2015.  No further questions.

## 2015-05-06 ENCOUNTER — Other Ambulatory Visit: Payer: Self-pay | Admitting: Endocrinology

## 2015-05-08 ENCOUNTER — Telehealth: Payer: Self-pay | Admitting: *Deleted

## 2015-05-08 NOTE — Telephone Encounter (Signed)
Melissa R.N., [place] Nurse with [place]: [phone] ext [phone] called to "notify Dr. Jana Hakim patient has the Mirena IUD which is contraindicated for breast cancer patients.  It may be listed as a procedure not medication but we want to make sure MD is aware of this and the higher breast cancer risk with this IUD."

## 2015-05-15 ENCOUNTER — Other Ambulatory Visit (HOSPITAL_BASED_OUTPATIENT_CLINIC_OR_DEPARTMENT_OTHER): Payer: BLUE CROSS/BLUE SHIELD

## 2015-05-15 DIAGNOSIS — C50311 Malignant neoplasm of lower-inner quadrant of right female breast: Secondary | ICD-10-CM

## 2015-05-15 DIAGNOSIS — D0511 Intraductal carcinoma in situ of right breast: Secondary | ICD-10-CM

## 2015-05-15 LAB — CBC WITH DIFFERENTIAL/PLATELET
BASO%: 0.6 % (ref 0.0–2.0)
Basophils Absolute: 0 10*3/uL (ref 0.0–0.1)
EOS%: 0.1 % (ref 0.0–7.0)
Eosinophils Absolute: 0 10*3/uL (ref 0.0–0.5)
HEMATOCRIT: 41.2 % (ref 34.8–46.6)
HGB: 13.7 g/dL (ref 11.6–15.9)
LYMPH#: 1.4 10*3/uL (ref 0.9–3.3)
LYMPH%: 17.3 % (ref 14.0–49.7)
MCH: 29.1 pg (ref 25.1–34.0)
MCHC: 33.3 g/dL (ref 31.5–36.0)
MCV: 87.3 fL (ref 79.5–101.0)
MONO#: 0.5 10*3/uL (ref 0.1–0.9)
MONO%: 5.9 % (ref 0.0–14.0)
NEUT%: 76.1 % (ref 38.4–76.8)
NEUTROS ABS: 6.3 10*3/uL (ref 1.5–6.5)
PLATELETS: 235 10*3/uL (ref 145–400)
RBC: 4.72 10*6/uL (ref 3.70–5.45)
RDW: 13.3 % (ref 11.2–14.5)
WBC: 8.2 10*3/uL (ref 3.9–10.3)

## 2015-05-15 LAB — COMPREHENSIVE METABOLIC PANEL
ALT: 74 U/L — AB (ref 0–55)
AST: 38 U/L — AB (ref 5–34)
Albumin: 3.9 g/dL (ref 3.5–5.0)
Alkaline Phosphatase: 199 U/L — ABNORMAL HIGH (ref 40–150)
Anion Gap: 6 mEq/L (ref 3–11)
BUN: 15.3 mg/dL (ref 7.0–26.0)
CHLORIDE: 108 meq/L (ref 98–109)
CO2: 25 meq/L (ref 22–29)
CREATININE: 1.1 mg/dL (ref 0.6–1.1)
Calcium: 9.3 mg/dL (ref 8.4–10.4)
EGFR: 59 mL/min/{1.73_m2} — ABNORMAL LOW (ref >90)
GLUCOSE: 89 mg/dL (ref 70–140)
Potassium: 4.2 mEq/L (ref 3.5–5.1)
SODIUM: 139 meq/L (ref 136–145)
Total Bilirubin: 1.67 mg/dL — ABNORMAL HIGH (ref 0.20–1.20)
Total Protein: 6.9 g/dL (ref 6.4–8.3)

## 2015-05-22 ENCOUNTER — Ambulatory Visit (HOSPITAL_BASED_OUTPATIENT_CLINIC_OR_DEPARTMENT_OTHER): Payer: BLUE CROSS/BLUE SHIELD | Admitting: Oncology

## 2015-05-22 VITALS — BP 113/67 | HR 75 | Temp 97.6°F | Resp 18 | Ht 67.0 in | Wt 183.4 lb

## 2015-05-22 DIAGNOSIS — C50311 Malignant neoplasm of lower-inner quadrant of right female breast: Secondary | ICD-10-CM

## 2015-05-22 DIAGNOSIS — Q999 Chromosomal abnormality, unspecified: Secondary | ICD-10-CM

## 2015-05-22 DIAGNOSIS — D0511 Intraductal carcinoma in situ of right breast: Secondary | ICD-10-CM | POA: Diagnosis not present

## 2015-05-22 DIAGNOSIS — C73 Malignant neoplasm of thyroid gland: Secondary | ICD-10-CM

## 2015-05-22 DIAGNOSIS — Z170 Estrogen receptor positive status [ER+]: Secondary | ICD-10-CM | POA: Diagnosis not present

## 2015-05-22 NOTE — Progress Notes (Signed)
Writer called [facility] per Dr. Jana Hakim.  Patient will be seen by Dr. Jola Schmidt on June 22 at 2:30.  Recent office note faxed to Dr. Valetta Close.  Office will mail her new patient paperwork along with appt date and time.

## 2015-05-22 NOTE — Progress Notes (Signed)
[place]  [phone] [phone]     ID: [name] DOB: 07/15/1970  MR#: [id number]  [id number]  Patient Care Team: Midge Minium, MD as PCP - General (Family Medicine) Kathyrn Lass, MD (Family Medicine) Rolm Bookbinder, MD as Consulting Physician (General Surgery) Irene Limbo, MD as Consulting Physician (Plastic Surgery) Sherren Mocha, MD as Consulting Physician (Cardiology) Devra Dopp, MD as Referring Physician (Dermatology) Jacinto Reap, MD (Plastic Surgery) PCP: Annye Asa, MD GYN: SU:  OTHER MD:  CHIEF COMPLAINT: Right-sided ductal carcinoma in situ, estrogen receptor positive  CURRENT TREATMENT: s/p DIEP flap reconstruction after bilateral mastectomies 03/09/2015   BREAST CANCER HISTORY: From the prior intake note:  [name] had bilateral screening mammography at a mobile clinic 11/16/2014. This showed a focal density in the lower inner quadrant of the right breast and she was recalled for diagnostic right mammography and right breast ultrasonography at the [facility] 11/28/2014. This showed the breast density to be category B. In the lower inner right breast there was a group of amorphous calcifications spanning approximately 3 cm. These were new as compared to mammography from 2014. Ultrasound of the area showed an ill-defined region of hypoechoic tissue but no definite mass.  Biopsy of the right breast mass in question 12/05/2014 showed ([id number]) ductal carcinoma in situ, grade 1, arising within an intraductal papilloma. The cells were estrogen receptor 9200% positive, progesterone receptor 80-90% positive, both with strong staining intensity.  The patient's subsequent history is as detailed below.  INTERVAL HISTORY: [place] today for follow-up of her ductal carcinoma in situ. Since her last visit here she went to [facility] where she underwent bilateral mastectomies  with right-sided sentinel lymph node sampling and immediate D IEP reconstruction per 20 05/03/2015. The pathology on the left was benign. On the right side there was a 2.8 cm area of ductal carcinoma in situ, estrogen and progesterone receptor positive. Margins were negative. The single right-sided sentinel lymph node was clear.  She did well with the surgery, although she did need of one unit of packed red cells postop. She is currently using only ibuprofen for pain. He has been no significant bleeding or fever issues.  REVIEW OF SYSTEMS: She is still limited as far as which activities she is able to do. She is mostly walking for exercise. She cannot lift more than 5 pounds currently. Because of the abdominal incision she is still a little bit "hunched over" when she walks. She has had no problems with bleeding except for pulling a Steri-Strip yesterday which disturbed a scab and that caused  A little bit of serous sanguinous material to show up on the 4 x 4 she put over it when she removed. Aside from these issues a detailed review of systems today was noncontributory  PAST MEDICAL HISTORY: Past Medical History  Diagnosis Date  . Hypothyroid   . Overweight(278.02)   . CAD (coronary artery disease)     open heart surgery 09/12/10  . History of thyroid cancer   . GERD (gastroesophageal reflux disease)   . Arthritis   . Heart disease   . Cancer [facility]) 2007    thyroid  . Breast cancer [facility]) 12/2014    Seeing [facility] doctors    PAST SURGICAL HISTORY: Past Surgical History  Procedure Laterality Date  . Thyroidectomy    . Coronary artery bypass grafting x1  09/13/10    [place] [facility]  . Arm surgery      rt  .  Ulnar nerve repair Right 1989, 2002  . Hernia repair  1977    inguinal, ?side    FAMILY HISTORY Family History  Problem Relation Age of Onset  . Hypertension Mother   . Colon polyps Mother   . Arthritis Mother   . Cancer Mother     breast  . Hypertension Father   . Hiatal hernia  Father   . Hypertension Sister   . Coronary artery disease Maternal Grandfather   . Cancer Maternal Aunt     breast, ovarian  . Lupus Maternal Aunt   . Heart disease Maternal Aunt   . Diabetes Maternal Aunt   . Diabetes Maternal Aunt   . Other Maternal Aunt     ?auto immune disease  . Colon cancer Neg Hx   . Arthritis Paternal Grandmother   . Diabetes Paternal Grandmother   . Parkinson's disease Paternal Grandmother   the patient's parents are in their late 34s as of December 2016. The patient's mother was diagnosed with breast cancer age 37; on the maternal side, the maternal grandmother was diagnosed with uterine cancer at age 68. 2 maternal aunts had one breast cancer and one uterine cancer and one maternal uncle had colon cancer is were in their 21s and 67s. On the father's side there is an uncle with colon cancer age 70. There is no history of ovarian cancer to the patient's knowledge   GYNECOLOGIC HISTORY:  No LMP recorded. Patient is not currently having periods (Reason: IUD). Menarche age 19, first live birth age 58. The patient is GX P2. She still having regular periods. She has a Mirena IUD in place.  SOCIAL HISTORY:  [name] works as a Dealer. Her husband [name] is an Brewing technologist. There is son [name] is a Equities trader at [employer]. Their daughter [name] is a Museum/gallery exhibitions officer at [employer]: Not in place   HEALTH MAINTENANCE: Social History  Substance Use Topics  . Smoking status: Never Smoker   . Smokeless tobacco: Never Used  . Alcohol Use: Yes     Comment: rarely     Colonoscopy:  PAP: 2013  Bone density:  Lipid panel:  No Known Allergies  Current Outpatient Prescriptions  Medication Sig Dispense Refill  . aspirin 81 MG tablet Take 81 mg by mouth every morning. Reported on 02/21/2015    . diphenhydrAMINE (BENADRYL) 25 mg capsule Take 25 mg by mouth at bedtime.     Marland Kitchen levonorgestrel (MIRENA) 20  MCG/24HR IUD 1 each by Intrauterine route once.      Marland Kitchen levothyroxine (SYNTHROID, LEVOTHROID) 150 MCG tablet TAKE 1 TABLET BY MOUTH EVERY MORNING 30 tablet 0  . omeprazole (PRILOSEC) 20 MG capsule Take 20 mg by mouth daily. Reported on 02/21/2015     No current facility-administered medications for this visit.    OBJECTIVE: Middle-aged white woman who appears well There were no vitals filed for this visit.   There is no weight on file to calculate BMI.    ECOG FS:1 - Symptomatic but completely ambulatory  Sclerae unicteric, pupils round and equal Oropharynx clear and moist-- no thrush or other lesions No cervical or supraclavicular adenopathy Lungs no rales or rhonchi Heart regular rate and rhythm Abd soft, nontender, positive bowel sounds; the abdominal incision cutting across from right to left is healing nicely, with the glue still in place; there is no evidence of dehiscence, swelling, or erythema MSK no focal spinal tenderness, no upper extremity lymphedema  Neuro: nonfocal, well oriented, appropriate affect Breasts: status post bilateral mastectomies with  Bilateral D IEP reconstruction. There is no evidence of necrosis. The cosmetic result is excellent. There is no dehiscence, erythema, or swelling. Both axillae are benign.she is next 180 this looks much worse. The problem is to start her today's current medicines trouble with insurance so we will have to put it off and that's okay S 100 She Had Back Pain She    LAB RESULTS:  CMP     Component Value Date/Time   NA 139 05/15/2015 1421   NA 135 02/12/2014 1740   K 4.2 05/15/2015 1421   K 3.5 02/12/2014 1740   CL 108 02/12/2014 1740   CO2 25 05/15/2015 1421   CO2 24 02/12/2014 1740   GLUCOSE 89 05/15/2015 1421   GLUCOSE 100* 02/12/2014 1740   BUN 15.3 05/15/2015 1421   BUN 10 02/12/2014 1740   CREATININE 1.1 05/15/2015 1421   CREATININE 0.85 02/12/2014 1740   CALCIUM 9.3 05/15/2015 1421   CALCIUM 9.1 02/12/2014 1740   PROT  6.9 05/15/2015 1421   PROT 7.1 09/22/2011 1048   ALBUMIN 3.9 05/15/2015 1421   ALBUMIN 4.0 09/22/2011 1048   AST 38* 05/15/2015 1421   AST 23 09/22/2011 1048   ALT 74* 05/15/2015 1421   ALT 29 09/22/2011 1048   ALKPHOS 199* 05/15/2015 1421   ALKPHOS 111 09/22/2011 1048   BILITOT 1.67* 05/15/2015 1421   BILITOT 1.1 09/22/2011 1048   GFRNONAA 82* 02/12/2014 1740   GFRAA >90 02/12/2014 1740    INo results found for: SPEP, UPEP  Lab Results  Component Value Date   WBC 8.2 05/15/2015   NEUTROABS 6.3 05/15/2015   HGB 13.7 05/15/2015   HCT 41.2 05/15/2015   MCV 87.3 05/15/2015   PLT 235 05/15/2015      Chemistry      Component Value Date/Time   NA 139 05/15/2015 1421   NA 135 02/12/2014 1740   K 4.2 05/15/2015 1421   K 3.5 02/12/2014 1740   CL 108 02/12/2014 1740   CO2 25 05/15/2015 1421   CO2 24 02/12/2014 1740   BUN 15.3 05/15/2015 1421   BUN 10 02/12/2014 1740   CREATININE 1.1 05/15/2015 1421   CREATININE 0.85 02/12/2014 1740      Component Value Date/Time   CALCIUM 9.3 05/15/2015 1421   CALCIUM 9.1 02/12/2014 1740   ALKPHOS 199* 05/15/2015 1421   ALKPHOS 111 09/22/2011 1048   AST 38* 05/15/2015 1421   AST 23 09/22/2011 1048   ALT 74* 05/15/2015 1421   ALT 29 09/22/2011 1048   BILITOT 1.67* 05/15/2015 1421   BILITOT 1.1 09/22/2011 1048       No results found for: LABCA2  No components found for: LABCA125  No results for input(s): INR in the last 168 hours.  Urinalysis    Component Value Date/Time   COLORURINE DARK YELLOW 02/20/2015 0705   APPEARANCEUR TURBID* 02/20/2015 0705   LABSPEC 1.023 02/20/2015 0705   PHURINE 5.5 02/20/2015 0705   GLUCOSEU NEGATIVE 02/20/2015 0705   GLUCOSEU NEGATIVE 01/30/2015 1428   HGBUR NEGATIVE 02/20/2015 0705   BILIRUBINUR NEGATIVE 02/20/2015 0705   KETONESUR NEGATIVE 02/20/2015 0705   PROTEINUR NEGATIVE 02/20/2015 0705   UROBILINOGEN 0.2 01/30/2015 1428   NITRITE NEGATIVE 02/20/2015 0705   LEUKOCYTESUR NEGATIVE  02/20/2015 0705    STUDIES: Mr Abdomen W Wo Contrast  04/25/2015  CLINICAL DATA:  Breast and thyroid cancer. Genetic mutation with high risk of  pancreatic malignancy. EXAM: MRI ABDOMEN WITHOUT AND WITH CONTRAST TECHNIQUE: Multiplanar multisequence MR imaging of the abdomen was performed both before and after the administration of intravenous contrast. CONTRAST:  7mL MULTIHANCE GADOBENATE DIMEGLUMINE 529 MG/ML IV SOLN COMPARISON:  Cardiac CT of 07/07/2011. 10/11/2010 abdominal ultrasound. FINDINGS: Lower chest: Normal heart size without pericardial or pleural effusion. Prior median sternotomy. Hepatobiliary: Hepatomegaly, 20 cm craniocaudal. No focal liver lesion. Two small gallstones on image 36/series 4. No wall thickening or pericholecystic fluid. No biliary duct dilatation. Pancreas: Normal appearance of the pancreas. No mass, duct dilatation, or acute inflammation. Spleen: Normal in size, without focal abnormality. Adrenals/Urinary Tract: Normal adrenal glands. Normal kidneys, without hydronephrosis. Stomach/Bowel: Normal stomach and abdominal bowel loops. Vascular/Lymphatic: Normal caliber of the aorta and branch vessels. No retroperitoneal or retrocrural adenopathy. Other: No ascites. Musculoskeletal: T2 hyperintense foci within the left side of the L3 vertebral body (image 17/series 12) and T8 vertebral body (image 10/series 5) are favored to represent hemangiomas. IMPRESSION: 1. Normal appearance of the pancreas. 2. Cholelithiasis. 3. Mild hepatomegaly. 4. No evidence of metastatic disease within the abdomen. Electronically Signed   By: Abigail Miyamoto M.D.   On: 04/25/2015 09:11    ASSESSMENT: 45 y.o. 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  The patient does not carry theCHEK2  Deletion present in her mother.  (a)  Patients who carry a  CDKN2A mutation are at high risk of melanoma  (28-76% lifetime risk) and pancreatic cancer (17% lifetime risk).  (2)  Status post bilateral mastectomies with immediate DIEP reconstruction 02/24//2017 showing  (a)  On the left side, benign breast tissue  (b)  On the right side, pTis pN0, stage 0 DCIS  measuring, 2.8 cm, estrogen receptor and progesterone receptor positive,   (3)  Screening for melanoma with by annual skin exam is recommended ([place])   (4) screening for pancreatic cancer will consist of by annual MRI,  with a baseline ERCP  (a) abdominal MRI for 12/03/2015 shows only cholelithiasis and mild hepatomegaly  (5) Mirena IUD in place   PLAN:  [name] is doing terrific overall and we reviewed the MRI of the abdomen which of course shows no evidence of pancreatic cancer.  She understands there is no well-established screening for pancreatic cancer and certainly no protocol that has been found to save lives. The suggested screening in this situation includes a single initial ERCP and I will try to arrange that for her. We will follow up with yearly abdominal MRI  She is having skin exams regularly through Dr. Renda Rolls. She still has not had a fundal exam. She says her optometrist has found some abnormality in the fundus which he has been following. I think it would be best if she saw an ophthalmologist and we have scheduled that appointment for her with [facility] ophthalmology.  The bra she is currently using is not fitting well. I am sending her to the appropriate store to see if she can find something that fits better. They will call her insurance to make sure that her plan will cover other options.  With regard to finances, she is also contesting the bill for her genetics testing. Our understanding was that if any patient was going to have  a bill of more than $  100 the testing agency would call. There apparently was no call and that is being looked at right now.   I don't have a good explanation of her liver function test abnormalities. She had a biopsy of the liver performed 11/25/2010 to evaluate precisely this problem, and it showed (SZB [id number]) no fibrosis, no staining for alpha-1 and titrate same or iron and essentially unremarkable liver. We are simply going to repeat her labs in 6 weeks.  She is going back to [facility] 06/21/2015 for further plastics work. She also follows up with cardiology and endocrinology. She is going to return to see me in about 6 months. At that time we may initiate yearly follow-up   Chauncey Cruel, MD   05/22/2015 8:36 AM Medical Oncology and Hematology [facility] [place], [place] [address] Tel. [phone]    Fax. [phone]

## 2015-06-07 ENCOUNTER — Other Ambulatory Visit: Payer: Self-pay | Admitting: Endocrinology

## 2015-07-27 DIAGNOSIS — Z9013 Acquired absence of bilateral breasts and nipples: Secondary | ICD-10-CM | POA: Diagnosis not present

## 2015-08-05 ENCOUNTER — Encounter: Payer: Self-pay | Admitting: Oncology

## 2015-08-07 DIAGNOSIS — M25561 Pain in right knee: Secondary | ICD-10-CM | POA: Diagnosis not present

## 2015-08-07 DIAGNOSIS — M25562 Pain in left knee: Secondary | ICD-10-CM | POA: Diagnosis not present

## 2015-08-10 ENCOUNTER — Encounter: Payer: Self-pay | Admitting: Cardiovascular Disease

## 2015-08-14 ENCOUNTER — Ambulatory Visit (HOSPITAL_BASED_OUTPATIENT_CLINIC_OR_DEPARTMENT_OTHER): Payer: BLUE CROSS/BLUE SHIELD

## 2015-08-14 ENCOUNTER — Other Ambulatory Visit: Payer: Self-pay

## 2015-08-14 DIAGNOSIS — C50311 Malignant neoplasm of lower-inner quadrant of right female breast: Secondary | ICD-10-CM

## 2015-08-14 LAB — CBC WITH DIFFERENTIAL/PLATELET
BASO%: 0.5 % (ref 0.0–2.0)
BASOS ABS: 0 10*3/uL (ref 0.0–0.1)
EOS ABS: 0 10*3/uL (ref 0.0–0.5)
EOS%: 0.2 % (ref 0.0–7.0)
HCT: 40.9 % (ref 34.8–46.6)
HEMOGLOBIN: 14 g/dL (ref 11.6–15.9)
LYMPH%: 15.6 % (ref 14.0–49.7)
MCH: 29.4 pg (ref 25.1–34.0)
MCHC: 34.2 g/dL (ref 31.5–36.0)
MCV: 85.8 fL (ref 79.5–101.0)
MONO#: 0.5 10*3/uL (ref 0.1–0.9)
MONO%: 5.1 % (ref 0.0–14.0)
NEUT%: 78.6 % — ABNORMAL HIGH (ref 38.4–76.8)
NEUTROS ABS: 6.9 10*3/uL — AB (ref 1.5–6.5)
PLATELETS: 224 10*3/uL (ref 145–400)
RBC: 4.76 10*6/uL (ref 3.70–5.45)
RDW: 13.7 % (ref 11.2–14.5)
WBC: 8.8 10*3/uL (ref 3.9–10.3)
lymph#: 1.4 10*3/uL (ref 0.9–3.3)

## 2015-08-14 LAB — COMPREHENSIVE METABOLIC PANEL
ALBUMIN: 3.7 g/dL (ref 3.5–5.0)
ALK PHOS: 108 U/L (ref 40–150)
ALT: 53 U/L (ref 0–55)
ANION GAP: 7 meq/L (ref 3–11)
AST: 54 U/L — ABNORMAL HIGH (ref 5–34)
BILIRUBIN TOTAL: 1.69 mg/dL — AB (ref 0.20–1.20)
BUN: 9.9 mg/dL (ref 7.0–26.0)
CALCIUM: 9.4 mg/dL (ref 8.4–10.4)
CO2: 25 mEq/L (ref 22–29)
Chloride: 109 mEq/L (ref 98–109)
Creatinine: 0.8 mg/dL (ref 0.6–1.1)
EGFR: 84 mL/min/{1.73_m2} — AB (ref >90)
Glucose: 90 mg/dl (ref 70–140)
POTASSIUM: 3.9 meq/L (ref 3.5–5.1)
SODIUM: 140 meq/L (ref 136–145)
TOTAL PROTEIN: 6.7 g/dL (ref 6.4–8.3)

## 2015-08-17 ENCOUNTER — Other Ambulatory Visit: Payer: Self-pay

## 2015-08-17 DIAGNOSIS — E663 Overweight: Secondary | ICD-10-CM

## 2015-08-17 DIAGNOSIS — I2510 Atherosclerotic heart disease of native coronary artery without angina pectoris: Secondary | ICD-10-CM

## 2015-08-20 ENCOUNTER — Other Ambulatory Visit: Payer: Self-pay | Admitting: Oncology

## 2015-08-21 ENCOUNTER — Telehealth: Payer: Self-pay | Admitting: Oncology

## 2015-08-21 NOTE — Telephone Encounter (Signed)
Spoke with pt to confirm 2/123/18 appt date/times per GM

## 2015-08-27 ENCOUNTER — Encounter: Payer: Self-pay | Admitting: Oncology

## 2015-09-07 ENCOUNTER — Telehealth: Payer: Self-pay | Admitting: Oncology

## 2015-09-07 NOTE — Telephone Encounter (Signed)
PATIENT CALLED TO [facility] APPT. 09/07/15

## 2015-09-09 NOTE — Progress Notes (Signed)
[place]  [phone] [phone]     ID: [name] DOB: 1970-04-28  MR#: [id number]  ZGY#:[id number]  Patient Care Team: Gina Minium, MD as PCP - General (Family Medicine) Gina Lass, MD (Family Medicine) Gina Bookbinder, MD as Consulting Physician (General Surgery) Gina Limbo, MD as Consulting Physician (Plastic Surgery) Gina Mocha, MD as Consulting Physician (Cardiology) Gina Dopp, MD as Referring Physician (Dermatology) Gina Reap, MD (Plastic Surgery) Gina Schmidt, MD as Consulting Physician (Ophthalmology) PCP: Gina Asa, MD GYN: SU:  OTHER MD:  CHIEF COMPLAINT: Right-sided ductal carcinoma in situ, status post mastectomy  CURRENT TREATMENT: Screening for melanoma and pancreatic cancer in the setting of CDKN2A mutation   BREAST CANCER HISTORY: From the prior intake note:  [name] had bilateral screening mammography at a mobile clinic 11/16/2014. This showed a focal density in the lower inner quadrant of the right breast and she was recalled for diagnostic right mammography and right breast ultrasonography at the [facility] 11/28/2014. This showed the breast density to be category B. In the lower inner right breast there was a group of amorphous calcifications spanning approximately 3 cm. These were new as compared to mammography from 2014. Ultrasound of the area showed an ill-defined region of hypoechoic tissue but no definite mass.  Biopsy of the right breast mass in question 12/05/2014 showed ([id number]) ductal carcinoma in situ, grade 1, arising within an intraductal papilloma. The cells were estrogen receptor 9200% positive, progesterone receptor 80-90% positive, both with strong staining intensity.  The patient's subsequent history is as detailed below.  INTERVAL HISTORY: [name] returns today for follow-up of her ductal carcinoma in situ and high risk genetic  mutation.  As far as the DCIS is concerned, she understands that the cure rate approaches 100% after mastectomy, so that requires no further intervention.  As far as the CDKN2A  mutation is concerned, she met with Dr. Valetta Jackson in ophthalmology and the fundus was clear. He will be doing the screening exam for melanoma yearly.  of course she will continue to see Dr. Renda Jackson on an every six-month basis.   [name] also had an MRI of the abdomen which showed no evidence of pancreatic or other cancer. This is particularly reassuring as she has had some liver function test abnormalities.  REVIEW OF SYSTEMS:  [name] still has significant knee problems. She is working with Dr. Elenore Jackson regarding this. She tries to walk 6 miles today but it is difficult for her. She is doing well with her breast reconstruction and "all I need now is the painting". A detailed review of systems today was otherwise noncontributory   PAST MEDICAL HISTORY: Past Medical History:  Diagnosis Date  . Arthritis   . Breast cancer [facility]) 12/2014   Seeing [facility] doctors  . CAD (coronary artery disease)    open heart surgery 09/12/10  . Cancer [facility]) 2007   thyroid  . GERD (gastroesophageal reflux disease)   . Heart disease   . History of thyroid cancer   . Hypothyroid   . Overweight(278.02)     PAST SURGICAL HISTORY: Past Surgical History:  Procedure Laterality Date  . arm surgery     rt  . coronary artery bypass grafting x1  09/13/10   [place] Gum  . HERNIA REPAIR  1977   inguinal, ?side  . THYROIDECTOMY    . ULNAR NERVE REPAIR Right 1989, 2002    FAMILY HISTORY Family History  Problem Relation Age of Onset  .  Hypertension Mother   . Colon polyps Mother   . Arthritis Mother   . Cancer Mother     breast  . Hypertension Father   . Hiatal hernia Father   . Hypertension Sister   . Coronary artery disease Maternal Grandfather   . Cancer Maternal Aunt     breast, ovarian  . Lupus Maternal Aunt   . Heart disease  Maternal Aunt   . Diabetes Maternal Aunt   . Diabetes Maternal Aunt   . Other Maternal Aunt     ?auto immune disease  . Colon cancer Neg Hx   . Arthritis Paternal Grandmother   . Diabetes Paternal Grandmother   . Parkinson's disease Paternal Grandmother   the patient's parents are in their late 48s as of December 2016. The patient's mother was diagnosed with breast cancer age 71; on the maternal side, the maternal grandmother was diagnosed with uterine cancer at age 35. 2 maternal aunts had one breast cancer and one uterine cancer and one maternal uncle had colon cancer is were in their 58s and 34s. On the father's side there is an uncle with colon cancer age 32. There is no history of ovarian cancer to the patient's knowledge   GYNECOLOGIC HISTORY:  No LMP recorded. Patient is not currently having periods (Reason: IUD). Menarche age 6, first live birth age 75. The patient is GX P2. She still having regular periods. She has a Mirena IUD in place.  SOCIAL HISTORY:  [name] works as a Dealer. Her husband [name] is an Brewing technologist. There is son [name] from [employer] in [place]. Their daughter [name] is a Museum/gallery exhibitions officer at [employer]: Not in place   HEALTH MAINTENANCE: Social History  Substance Use Topics  . Smoking status: Never Smoker  . Smokeless tobacco: Never Used  . Alcohol use Yes     Comment: rarely     Colonoscopy:  PAP: 2013  Bone density:  Lipid panel:  No Known Allergies  Current Outpatient Prescriptions  Medication Sig Dispense Refill  . aspirin 81 MG tablet Take 81 mg by mouth every morning. Reported on 02/21/2015    . diphenhydrAMINE (BENADRYL) 25 mg capsule Take 25 mg by mouth at bedtime.     Marland Kitchen levonorgestrel (MIRENA) 20 MCG/24HR IUD 1 each by Intrauterine route once.      Marland Kitchen levothyroxine (SYNTHROID, LEVOTHROID) 150 MCG tablet TAKE 1 TABLET BY MOUTH EVERY MORNING 30 tablet 3  . omeprazole  (PRILOSEC) 20 MG capsule Take 20 mg by mouth daily. Reported on 02/21/2015     No current facility-administered medications for this visit.     OBJECTIVE: Middle-aged white Jackson who appears well Vitals:   09/10/15 1214  BP: 129/80  Pulse: 79  Resp: 18  Temp: 98.6 F (37 C)     Body mass index is 28.72 kg/m.    ECOG FS:1 - Symptomatic but completely ambulatory  Sclerae unicteric, EOMs intact Oropharynx clear and moist No cervical or supraclavicular adenopathy Lungs no rales or rhonchi Heart regular rate and rhythm Abd soft, nontender, positive bowel sounds MSK no focal spinal tenderness, no upper extremity lymphedema Neuro: nonfocal, well oriented, appropriate affect Breasts: Status post mastectomies. Status post bilateral ofDIEP reconstruction. There is no evidence of local recurrence. Both axillae are benign.  LAB RESULTS:  CMP     Component Value Date/Time   NA 140 08/14/2015 0948   K 3.9 08/14/2015 0948   CL 108 02/12/2014 1740  CO2 25 08/14/2015 0948   GLUCOSE 90 08/14/2015 0948   BUN 9.9 08/14/2015 0948   CREATININE 0.8 08/14/2015 0948   CALCIUM 9.4 08/14/2015 0948   PROT 6.7 08/14/2015 0948   ALBUMIN 3.7 08/14/2015 0948   AST 54 (H) 08/14/2015 0948   ALT 53 08/14/2015 0948   ALKPHOS 108 08/14/2015 0948   BILITOT 1.69 (H) 08/14/2015 0948   GFRNONAA 82 (L) 02/12/2014 1740   GFRAA >90 02/12/2014 1740    INo results found for: SPEP, UPEP  Lab Results  Component Value Date   WBC 8.8 08/14/2015   NEUTROABS 6.9 (H) 08/14/2015   HGB 14.0 08/14/2015   HCT 40.9 08/14/2015   MCV 85.8 08/14/2015   PLT 224 08/14/2015      Chemistry      Component Value Date/Time   NA 140 08/14/2015 0948   K 3.9 08/14/2015 0948   CL 108 02/12/2014 1740   CO2 25 08/14/2015 0948   BUN 9.9 08/14/2015 0948   CREATININE 0.8 08/14/2015 0948      Component Value Date/Time   CALCIUM 9.4 08/14/2015 0948   ALKPHOS 108 08/14/2015 0948   AST 54 (H) 08/14/2015 0948   ALT 53  08/14/2015 0948   BILITOT 1.69 (H) 08/14/2015 0948       No results found for: LABCA2  No components found for: LABCA125  No results for input(s): INR in the last 168 hours.  Urinalysis    Component Value Date/Time   COLORURINE DARK YELLOW 02/20/2015 0705   APPEARANCEUR TURBID (A) 02/20/2015 0705   LABSPEC 1.023 02/20/2015 0705   PHURINE 5.5 02/20/2015 0705   GLUCOSEU NEGATIVE 02/20/2015 0705   GLUCOSEU NEGATIVE 01/30/2015 1428   HGBUR NEGATIVE 02/20/2015 0705   BILIRUBINUR NEGATIVE 02/20/2015 0705   KETONESUR NEGATIVE 02/20/2015 0705   PROTEINUR NEGATIVE 02/20/2015 0705   UROBILINOGEN 0.2 01/30/2015 1428   NITRITE NEGATIVE 02/20/2015 0705   LEUKOCYTESUR NEGATIVE 02/20/2015 0705    STUDIES: No results found.  ASSESSMENT: 45 y.o. [name] status post right breast lower inner quadrant biopsy 12/05/2014 for ductal carcinoma in situ arising from an intraductal papilloma, estrogen and progesterone receptor positive  (1) genetics testing at [facility] obtained December 2016 showed a deleterious mutation in CDKN2A ( specifically c.9_32dup [Ala3 Pro11dup]).  The patient does not carry theCHEK2  Deletion present in her mother.  (a)  Patients who carry a  CDKN2A mutation are at high risk of melanoma  (28-76% lifetime risk) and pancreatic cancer (17% lifetime risk).  (2)  Status post bilateral mastectomies with immediate DIEP reconstruction 02/24//2017 showing  (a)  On the left side, benign breast tissue  (b)  On the right side, pTis pN0, stage 0 DCIS  measuring, 2.8 cm, estrogen receptor and progesterone receptor positive,   (3)  Screening for melanoma with by annual skin exam is recommended ([place])   (4) screening for pancreatic cancer will consist of by annual MRI,  with a baseline ERCP  (a) abdominal MRI 04/12/2017shows only cholelithiasis and mild hepatomegaly  (5) Mirena IUD in place   PLAN:  [name] is now half a year out from definitive surgery for her  noninvasive breast cancer. She is recovering well from that procedure. She is undergoing reconstruction after her bilateral mastectomies and is satisfied with the cosmetic result. All she has left to do is the tattooing.  Because the cure rate for non-invasive breast cancer with mastectomy approaches 100%, that requires no further follow-up.  She is very concerned about her  weight. She has had a great deal of difficulty losing to this point and does not like to gain. We discussed this at length and she has an appointment with a nutritionist at [facility]. If that is not satisfactory she will let me know.  We discussed screening for pancreatic cancer, and NCCN guidelines suggest an ERCP initially then yearly abdominal MRIs. After much discussion and understanding that there is leeway here we are going to do a second MRI this year in lieu of the ERCP and then she will have MRIs yearly thereafter. She is very pleased with this decision.  She will be seeing her dermatologist twice a year and her ophthalmologist once a year for melanoma screening.  I commended her commitment to an exercise program, and gave her information on [employer]. Because of her knee problems she is having difficulty keeping up her cardio and she may consider getting a stationary bike secondhand.   Otherwise she will see me again February 2018 as already scheduled, at which point we will review the results of the MRI in December and likely start yearly follow-up  Chauncey Cruel, MD   09/10/2015 6:50 PM Medical Oncology and Hematology [facility] [place], [place] [address] Tel. [phone]    Fax. [phone]

## 2015-09-10 ENCOUNTER — Other Ambulatory Visit: Payer: Self-pay

## 2015-09-10 ENCOUNTER — Ambulatory Visit (HOSPITAL_BASED_OUTPATIENT_CLINIC_OR_DEPARTMENT_OTHER): Payer: BLUE CROSS/BLUE SHIELD | Admitting: Oncology

## 2015-09-10 VITALS — BP 129/80 | HR 79 | Temp 98.6°F | Resp 18 | Ht 67.0 in | Wt 183.4 lb

## 2015-09-10 DIAGNOSIS — Q999 Chromosomal abnormality, unspecified: Secondary | ICD-10-CM

## 2015-09-10 DIAGNOSIS — C50311 Malignant neoplasm of lower-inner quadrant of right female breast: Secondary | ICD-10-CM

## 2015-09-10 DIAGNOSIS — C73 Malignant neoplasm of thyroid gland: Secondary | ICD-10-CM

## 2015-09-10 DIAGNOSIS — Z1509 Genetic susceptibility to other malignant neoplasm: Principal | ICD-10-CM

## 2015-09-10 DIAGNOSIS — Z170 Estrogen receptor positive status [ER+]: Secondary | ICD-10-CM | POA: Diagnosis not present

## 2015-09-10 DIAGNOSIS — Z1501 Genetic susceptibility to malignant neoplasm of breast: Secondary | ICD-10-CM

## 2015-09-10 DIAGNOSIS — Z1379 Encounter for other screening for genetic and chromosomal anomalies: Secondary | ICD-10-CM

## 2015-09-10 DIAGNOSIS — D0511 Intraductal carcinoma in situ of right breast: Secondary | ICD-10-CM | POA: Diagnosis not present

## 2015-09-10 MED ORDER — PROCHLORPERAZINE MALEATE 10 MG PO TABS
ORAL_TABLET | ORAL | Status: AC
  Filled 2015-09-10: qty 1

## 2015-09-12 ENCOUNTER — Encounter: Payer: Self-pay | Admitting: Skilled Nursing Facility1

## 2015-09-12 ENCOUNTER — Encounter: Payer: BLUE CROSS/BLUE SHIELD | Attending: Family Medicine | Admitting: Skilled Nursing Facility1

## 2015-09-12 DIAGNOSIS — Z713 Dietary counseling and surveillance: Secondary | ICD-10-CM | POA: Insufficient documentation

## 2015-09-12 DIAGNOSIS — I2510 Atherosclerotic heart disease of native coronary artery without angina pectoris: Secondary | ICD-10-CM | POA: Diagnosis not present

## 2015-09-12 DIAGNOSIS — E663 Overweight: Secondary | ICD-10-CM | POA: Diagnosis not present

## 2015-09-12 NOTE — Patient Instructions (Addendum)
-  Give your body a break at least one day a week -Take a multivitamin plus minerals -Start trying to sneak non starchy vegetables into your diet: vegetable soup, smoothies, etc

## 2015-09-12 NOTE — Progress Notes (Signed)
  Medical Nutrition Therapy:  Appt start time: 1400 end time:  1500   Assessment:  Primary concerns today: weight gain. Pt sates she is having surgery tomorrow on her knee. Pt states she was losing wt and then plateaued. Pt states she will be walking 4-6 miles a day and would like to know how to properly fuel her body. Pt states she has lost 60 pounds. Pt states she has had a bilateral mastectomy in February.   Preferred Learning Style:   Auditory  Learning Readiness:   Change in progress   MEDICATIONS: See List   DIETARY INTAKE:  Usual eating pattern includes 3 meals and 3 snacks per day.  Everyday foods include none stated.  Avoided foods include non starchy vegeables.    24-hr recall:  B ( AM): protein shake Snk ( AM): apple or banana then fiber one bar L ( PM): lettuce and chicken or egg Snk ( PM): pretzels----something salty----apple or strawberries D ( PM): hamburgers---chicken-----potatoes  Snk ( PM):  Beverages: hot tea, water, sweet tea *meals outside the home 2 Usual physical activity: walks 4-6 miles a day  Estimated energy needs: 1800 calories 200 g carbohydrates 135 g protein 50 g fat  Progress Towards Goal(s):  In progress.  Intervention:  Nutrition counseling for weight. Dietitian educated the pt on the fact that her body is still healing so she may not see her wt drop for a while, meal balance, and the importance of vegetables for her higher need state. Goals: -Give your body a break at least one day a week -Take a multivitamin plus minerals -Start trying to sneak non starchy vegetables into your diet: vegetable soup, smoothies, etc  Teaching Method Utilized:  Visual Auditory Hands on  Barriers to learning/adherence to lifestyle change: dislike for vegetables  Demonstrated degree of understanding via:  Teach Back   Monitoring/Evaluation:  Dietary intake, exercise, and body weight prn.

## 2015-09-13 HISTORY — PX: KNEE ARTHROSCOPY: SHX127

## 2015-09-24 ENCOUNTER — Other Ambulatory Visit: Payer: Self-pay | Admitting: Endocrinology

## 2015-10-05 ENCOUNTER — Telehealth: Payer: Self-pay | Admitting: *Deleted

## 2015-10-05 ENCOUNTER — Ambulatory Visit (INDEPENDENT_AMBULATORY_CARE_PROVIDER_SITE_OTHER): Payer: BLUE CROSS/BLUE SHIELD | Admitting: Gastroenterology

## 2015-10-05 ENCOUNTER — Encounter: Payer: Self-pay | Admitting: Gastroenterology

## 2015-10-05 VITALS — BP 116/82 | HR 68 | Ht 68.0 in | Wt 187.5 lb

## 2015-10-05 DIAGNOSIS — C73 Malignant neoplasm of thyroid gland: Secondary | ICD-10-CM

## 2015-10-05 DIAGNOSIS — Z1509 Genetic susceptibility to other malignant neoplasm: Secondary | ICD-10-CM | POA: Diagnosis not present

## 2015-10-05 DIAGNOSIS — C50311 Malignant neoplasm of lower-inner quadrant of right female breast: Secondary | ICD-10-CM | POA: Diagnosis not present

## 2015-10-05 NOTE — Telephone Encounter (Signed)
Called the patient to advise we ordered the MRI Abd MRCP for Wed 10-17-2015. She is to arrive at 7:45 am for an 8:00 am appointment.  She is to have nothing by mouth after midnight. She said they had trouble with her vein/IV contrast the last time. I told her to stay very hydrated the day before and night before and to drink plenty of water several days prior to 10-4.  Location of test is [address], [address], First floor.  Patient verbalized understanding of test.

## 2015-10-05 NOTE — Patient Instructions (Signed)
We will call you with any changes after Alonza Bogus PA speaks with Dr. Fredric Dine.

## 2015-10-05 NOTE — Progress Notes (Signed)
10/05/2015 VANNA PETZOLDT [id number] 07-01-1970   HISTORY OF PRESENT ILLNESS:  This is a 45 her female who is new to our office and has been referred here by Dr. Jana Hakim with oncology.  She had recently been diagnosed with breast cancer and has a history of thyroid cancer as well. She has undergone genetic testing and found to have a deleterious mutation and CDKN2A that puts her at 17% lifetime risk for pancreatic cancer. There is no well-established screening for pancreatic cancer. She was seen by Dr. Jana Hakim and had a long discussion with him. According to his note there are NCCN guidelines suggesting an ERCP initially and then yearly abdominal MRIs. At the conclusion of the last visit it was determined that she would have 2 MRIs this year in lieu of an ERCP and then she would have MRIs yearly thereafter. Due to the previous discussion regarding ERCP she had been sent here to discuss that further. She is certainly on board with recommendations that Dr. Jana Hakim made at their last visit.  No GI complaints.   Past Medical History:  Diagnosis Date  . Arthritis   . Breast cancer [facility]) 12/2014   Seeing [facility] doctors  . CAD (coronary artery disease)    open heart surgery 09/12/10  . Cancer [facility]) 2007   thyroid  . GERD (gastroesophageal reflux disease)   . Heart disease   . History of thyroid cancer   . Hypothyroid   . Overweight(278.02)    Past Surgical History:  Procedure Laterality Date  . arm surgery     rt  . coronary artery bypass grafting x1  09/13/10   [place] Gum  . HERNIA REPAIR  1977   inguinal, ?side  . THYROIDECTOMY    . ULNAR NERVE REPAIR Right 1989, 2002    reports that she has never smoked. She has never used smokeless tobacco. She reports that she drinks alcohol. She reports that she does not use drugs. family history includes Arthritis in her mother and paternal grandmother; Cancer in her maternal aunt and mother; Colon polyps in her mother; Coronary artery  disease in her maternal grandfather; Diabetes in her maternal aunt, maternal aunt, and paternal grandmother; Heart disease in her maternal aunt; Hiatal hernia in her father; Hypertension in her father, mother, and sister; Lupus in her maternal aunt; Other in her maternal aunt; Parkinson's disease in her paternal grandmother. No Known Allergies    Outpatient Encounter Prescriptions as of 10/05/2015  Medication Sig  . aspirin 81 MG tablet Take 81 mg by mouth every morning. Reported on 02/21/2015  . diphenhydrAMINE (BENADRYL) 25 mg capsule Take 25 mg by mouth at bedtime.   Marland Kitchen levonorgestrel (MIRENA) 20 MCG/24HR IUD 1 each by Intrauterine route once.    Marland Kitchen levothyroxine (SYNTHROID, LEVOTHROID) 150 MCG tablet TAKE 1 TABLET BY MOUTH EVERY MORNING  . [DISCONTINUED] omeprazole (PRILOSEC) 20 MG capsule Take 20 mg by mouth daily. Reported on 02/21/2015   No facility-administered encounter medications on file as of 10/05/2015.      REVIEW OF SYSTEMS  : All other systems reviewed and negative except where noted in the History of Present Illness.   PHYSICAL EXAM: BP 116/82   Pulse 68   Ht 5\' 8"  (1.727 m)   Wt 187 lb 8 oz (85 kg)   BMI 28.51 kg/m  General: Well developed white female in no acute distress Head: Normocephalic and atraumatic Eyes:  Sclerae anicteric, conjunctiva pink. Ears: Normal auditory acuity Lungs: Clear throughout to  auscultation Heart: Regular rate and rhythm Abdomen: Soft, non-distended.  Normal bowel sounds.  Non-tender.  Scars from recent breast reconstruction surgery noted. Musculoskeletal: Symmetrical with no gross deformities  Skin: No lesions on visible extremities Extremities: No edema  Neurological: Alert oriented x 4, grossly non-focal Cervical Nodes:  No significant cervical adenopathy Psychological:  Alert and cooperative. Normal mood and affect  ASSESSMENT AND PLAN: -45 year old female with history of breast cancer and thyroid cancer who underwent genetic testing  and was found to have a deleterious mutation CDKN2A and that puts her at 17% lifetime risk for pancreatic cancer. There is no well-established screening for pancreatic cancer.  Certainly ERCPs are not performed on a regular routine for any type of screening purposes as they do not come without risk of inducing pancreatitis, etc. I discussed with Dr. Jana Hakim by phone and we have come to the agreement to have her undergo MRI abdomen/MRCP's has her screening studies. We will order her next study, which will be her second MRI this year, but hopefully Dr. Jana Hakim will take over this role in the future since he will see her somewhat regularly.   CC:  Midge Minium, MD

## 2015-10-08 NOTE — Progress Notes (Signed)
Reviewed and agree as I discussed the management plan with Ms. [name] at the time of the office visit. Annual abdominal MRI with MRCP appears to be the best option to screen for pancreatitis cancer.  Pricilla Riffle. Fuller Plan, MD [facility]

## 2015-10-17 ENCOUNTER — Other Ambulatory Visit: Payer: Self-pay | Admitting: Gastroenterology

## 2015-10-17 ENCOUNTER — Ambulatory Visit (HOSPITAL_COMMUNITY)
Admission: RE | Admit: 2015-10-17 | Discharge: 2015-10-17 | Disposition: A | Discharge status: Home / Self Care | Payer: BLUE CROSS/BLUE SHIELD | Source: Ambulatory Visit | Attending: Gastroenterology | Admitting: Gastroenterology

## 2015-10-17 DIAGNOSIS — K8020 Calculus of gallbladder without cholecystitis without obstruction: Secondary | ICD-10-CM | POA: Diagnosis not present

## 2015-10-17 DIAGNOSIS — R160 Hepatomegaly, not elsewhere classified: Secondary | ICD-10-CM | POA: Diagnosis not present

## 2015-10-17 DIAGNOSIS — C73 Malignant neoplasm of thyroid gland: Secondary | ICD-10-CM | POA: Insufficient documentation

## 2015-10-17 DIAGNOSIS — Z1509 Genetic susceptibility to other malignant neoplasm: Secondary | ICD-10-CM

## 2015-10-17 MED ORDER — GADOBENATE DIMEGLUMINE 529 MG/ML IV SOLN
20.0000 mL | Freq: Once | INTRAVENOUS | Status: AC | PRN
  Administered 2015-10-17: 17 mL via INTRAVENOUS

## 2015-10-18 ENCOUNTER — Other Ambulatory Visit: Payer: Self-pay | Admitting: Endocrinology

## 2015-10-22 ENCOUNTER — Encounter: Payer: Self-pay | Admitting: Family

## 2015-10-22 ENCOUNTER — Ambulatory Visit (INDEPENDENT_AMBULATORY_CARE_PROVIDER_SITE_OTHER): Payer: BLUE CROSS/BLUE SHIELD | Admitting: Family

## 2015-10-22 VITALS — BP 123/89 | HR 74 | Temp 98.2°F | Resp 18 | Ht 67.0 in | Wt 188.0 lb

## 2015-10-22 DIAGNOSIS — R0789 Other chest pain: Secondary | ICD-10-CM | POA: Diagnosis not present

## 2015-10-22 DIAGNOSIS — Z23 Encounter for immunization: Secondary | ICD-10-CM

## 2015-10-22 MED ORDER — MELOXICAM 7.5 MG PO TABS: 7.5000 mg | ORAL_TABLET | Freq: Every day | ORAL | 0 refills | Status: DC

## 2015-10-22 NOTE — Patient Instructions (Signed)
Please begin meloxicam once daily for your musculoskeletal chest pain.  Call if symptoms are not improved in 1-2 weeks.

## 2015-10-22 NOTE — Progress Notes (Signed)
Subjective:    Patient ID: [name], female    DOB: [birth year], 45 y.o.   MRN: [id number]  HPI  [name] is a 45 yr old female who presents today with chief complaint of chest tenderness. Chest tenderness is located in the right upper area of her chest.  Tenderness began 2 weeks ago. She has been active with PT for her knee.  She denies cough/cold symptoms.  She tried aleve.  Laying down seems to help the most.  Pain is made worse by turning her head.  Denies pain in her chest, SOB.    Review of Systems See HPI  Past Medical History:  Diagnosis Date  . Arthritis   . Breast cancer [facility]) 12/2014   Seeing [facility] doctors  . CAD (coronary artery disease)    open heart surgery 09/12/10  . Cancer [facility]) 2007   thyroid  . GERD (gastroesophageal reflux disease)   . Heart disease   . History of thyroid cancer   . Hypothyroid   . Overweight(278.02)      Social History   Social History  . Marital status: Married    Spouse name: N/A  . Number of children: 2  . Years of education: 16   Occupational History  . IT [employer]   Social History Main Topics  . Smoking status: Never Smoker  . Smokeless tobacco: Never Used  . Alcohol use Yes     Comment: rarely  . Drug use: No  . Sexual activity: Yes    Birth control/ protection: IUD   Other Topics Concern  . Not on file   Social History Narrative   Regular exercise-yes   Caffeine Use-yes   Works as Patent examiner   [phone]- son   Married   Enjoys Marine scientist and a Scientist, research (physical sciences) degree    Past Surgical History:  Procedure Laterality Date  . arm surgery     rt  . coronary artery bypass grafting x1  09/13/10   [place] Gum  . HERNIA REPAIR  1977   inguinal, ?side  . KNEE ARTHROSCOPY Right 09/13/2015   pt reported  . THYROIDECTOMY    . ULNAR NERVE REPAIR Right 1989, 2002    Family History  Problem Relation Age of Onset  . Hypertension Mother   . Colon  polyps Mother   . Arthritis Mother   . Cancer Mother     breast  . Hypertension Father   . Hiatal hernia Father   . Hypertension Sister   . Lupus Maternal Aunt   . Heart disease Maternal Aunt   . Diabetes Maternal Aunt   . Arthritis Paternal Grandmother   . Diabetes Paternal Grandmother   . Parkinson's disease Paternal Grandmother   . Coronary artery disease Maternal Grandfather   . Cancer Maternal Aunt     breast, ovarian  . Diabetes Maternal Aunt   . Other Maternal Aunt     ?auto immune disease  . Colon cancer Neg Hx     No Known Allergies  Current Outpatient Prescriptions on File Prior to Visit  Medication Sig Dispense Refill  . diphenhydrAMINE (BENADRYL) 25 mg capsule Take 25 mg by mouth at bedtime.     Marland Kitchen levonorgestrel (MIRENA) 20 MCG/24HR IUD 1 each by Intrauterine route once.      Marland Kitchen levothyroxine (SYNTHROID, LEVOTHROID) 150 MCG tablet TAKE 1 TABLET BY MOUTH EVERY MORNING 30 tablet 0  . aspirin  81 MG tablet Take 81 mg by mouth every morning. Reported on 02/21/2015     No current facility-administered medications on file prior to visit.     BP 123/89 (BP Location: Left Arm, Cuff Size: Normal)   Pulse 74   Temp 98.2 F (36.8 C) (Oral)   Resp 18   Ht 5\' 7"  (1.702 m)   Wt 188 lb (85.3 kg)   SpO2 99% Comment: room air  BMI 29.44 kg/m       Objective:   Physical Exam  Constitutional: She is oriented to person, place, and time. She appears well-developed and well-nourished.  Cardiovascular: Normal rate, regular rhythm and normal heart sounds.   No murmur heard. Pulmonary/Chest: Effort normal and breath sounds normal. No respiratory distress. She has no wheezes.  Musculoskeletal: She exhibits no edema.  Mild tenderness to palpation beneath the right clavicle  Neurological: She is alert and oriented to person, place, and time.  Skin: Skin is warm and dry.  Psychiatric: She has a normal mood and affect. Her behavior is normal. Judgment and thought content normal.           Assessment & Plan:  Costochondritis- will give trial of meloxicam.  Advised pt to let us know if symptoms worsen or if they are not improved in  1-2 weeks. She does have hx of EKG but her symptoms are very atypical. EKG performed to day is personally reviewed and compared to previous EKG and appears unchanged.

## 2015-10-22 NOTE — Progress Notes (Signed)
Pre visit review using our clinic review tool, if applicable. No additional management support is needed unless otherwise documented below in the visit note. 

## 2015-10-23 MED ORDER — PROCHLORPERAZINE MALEATE 10 MG PO TABS
ORAL_TABLET | ORAL | Status: AC
  Filled 2015-10-23: qty 1

## 2015-11-06 DIAGNOSIS — E663 Overweight: Secondary | ICD-10-CM | POA: Diagnosis not present

## 2015-11-06 DIAGNOSIS — N951 Menopausal and female climacteric states: Secondary | ICD-10-CM | POA: Diagnosis not present

## 2015-11-06 DIAGNOSIS — E538 Deficiency of other specified B group vitamins: Secondary | ICD-10-CM | POA: Diagnosis not present

## 2015-11-06 DIAGNOSIS — E039 Hypothyroidism, unspecified: Secondary | ICD-10-CM | POA: Diagnosis not present

## 2015-11-06 DIAGNOSIS — E559 Vitamin D deficiency, unspecified: Secondary | ICD-10-CM | POA: Diagnosis not present

## 2015-11-08 DIAGNOSIS — E039 Hypothyroidism, unspecified: Secondary | ICD-10-CM | POA: Diagnosis not present

## 2015-11-08 DIAGNOSIS — E538 Deficiency of other specified B group vitamins: Secondary | ICD-10-CM | POA: Diagnosis not present

## 2015-11-08 DIAGNOSIS — E559 Vitamin D deficiency, unspecified: Secondary | ICD-10-CM | POA: Diagnosis not present

## 2015-11-08 DIAGNOSIS — E663 Overweight: Secondary | ICD-10-CM | POA: Diagnosis not present

## 2015-11-12 ENCOUNTER — Other Ambulatory Visit: Payer: Self-pay | Admitting: Endocrinology

## 2015-11-15 ENCOUNTER — Encounter: Payer: Self-pay | Admitting: Endocrinology

## 2015-11-15 DIAGNOSIS — E663 Overweight: Secondary | ICD-10-CM | POA: Diagnosis not present

## 2015-11-15 DIAGNOSIS — E538 Deficiency of other specified B group vitamins: Secondary | ICD-10-CM | POA: Diagnosis not present

## 2015-11-15 DIAGNOSIS — E039 Hypothyroidism, unspecified: Secondary | ICD-10-CM | POA: Diagnosis not present

## 2015-11-15 DIAGNOSIS — E559 Vitamin D deficiency, unspecified: Secondary | ICD-10-CM | POA: Diagnosis not present

## 2015-11-16 ENCOUNTER — Other Ambulatory Visit: Payer: Self-pay

## 2015-11-16 MED ORDER — LEVOTHYROXINE SODIUM 150 MCG PO TABS: 150.0000 ug | ORAL_TABLET | Freq: Every morning | ORAL | 11 refills | Status: DC

## 2015-11-22 DIAGNOSIS — E559 Vitamin D deficiency, unspecified: Secondary | ICD-10-CM | POA: Diagnosis not present

## 2015-11-22 DIAGNOSIS — E039 Hypothyroidism, unspecified: Secondary | ICD-10-CM | POA: Diagnosis not present

## 2015-11-22 DIAGNOSIS — E538 Deficiency of other specified B group vitamins: Secondary | ICD-10-CM | POA: Diagnosis not present

## 2015-11-22 DIAGNOSIS — E663 Overweight: Secondary | ICD-10-CM | POA: Diagnosis not present

## 2015-11-23 ENCOUNTER — Encounter (HOSPITAL_COMMUNITY): Payer: Self-pay

## 2015-12-03 ENCOUNTER — Encounter: Payer: Self-pay | Admitting: Oncology

## 2015-12-05 DIAGNOSIS — E538 Deficiency of other specified B group vitamins: Secondary | ICD-10-CM | POA: Diagnosis not present

## 2015-12-05 DIAGNOSIS — E039 Hypothyroidism, unspecified: Secondary | ICD-10-CM | POA: Diagnosis not present

## 2015-12-05 DIAGNOSIS — E663 Overweight: Secondary | ICD-10-CM | POA: Diagnosis not present

## 2015-12-05 DIAGNOSIS — E559 Vitamin D deficiency, unspecified: Secondary | ICD-10-CM | POA: Diagnosis not present

## 2015-12-13 DIAGNOSIS — E559 Vitamin D deficiency, unspecified: Secondary | ICD-10-CM | POA: Diagnosis not present

## 2015-12-13 DIAGNOSIS — E538 Deficiency of other specified B group vitamins: Secondary | ICD-10-CM | POA: Diagnosis not present

## 2015-12-13 DIAGNOSIS — E663 Overweight: Secondary | ICD-10-CM | POA: Diagnosis not present

## 2015-12-13 DIAGNOSIS — E039 Hypothyroidism, unspecified: Secondary | ICD-10-CM | POA: Diagnosis not present

## 2015-12-15 ENCOUNTER — Other Ambulatory Visit: Payer: Self-pay | Admitting: Oncology

## 2015-12-15 DIAGNOSIS — Q999 Chromosomal abnormality, unspecified: Secondary | ICD-10-CM

## 2015-12-15 DIAGNOSIS — Z1379 Encounter for other screening for genetic and chromosomal anomalies: Secondary | ICD-10-CM

## 2015-12-15 DIAGNOSIS — C73 Malignant neoplasm of thyroid gland: Secondary | ICD-10-CM

## 2015-12-15 DIAGNOSIS — C50311 Malignant neoplasm of lower-inner quadrant of right female breast: Secondary | ICD-10-CM

## 2015-12-17 ENCOUNTER — Ambulatory Visit (HOSPITAL_COMMUNITY): Admission: RE | Admit: 2015-12-17 | Payer: BLUE CROSS/BLUE SHIELD | Source: Ambulatory Visit

## 2016-02-01 DIAGNOSIS — J101 Influenza due to other identified influenza virus with other respiratory manifestations: Secondary | ICD-10-CM | POA: Diagnosis not present

## 2016-02-13 IMAGING — CT CT HEAD W/O CM
1 series · 16 of 30 positions shown, 20 images · non-contrast
Comparison: None.

CLINICAL DATA: Headache for 5 days.  Hypertension.

EXAM:
CT HEAD WITHOUT CONTRAST
TECHNIQUE: Contiguous axial images were obtained from the base of the skull
through the vertex without intravenous contrast.

[Series 2: head 4.8 h37s · axial · 0.51mm/px · z∈[-140,+20]mm · 16 of 36 slices shown, 20 images]
[im 2/36  brain]
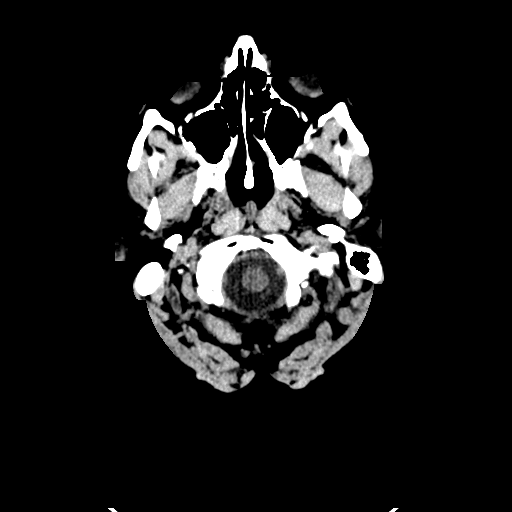
[im 2/36  bone]
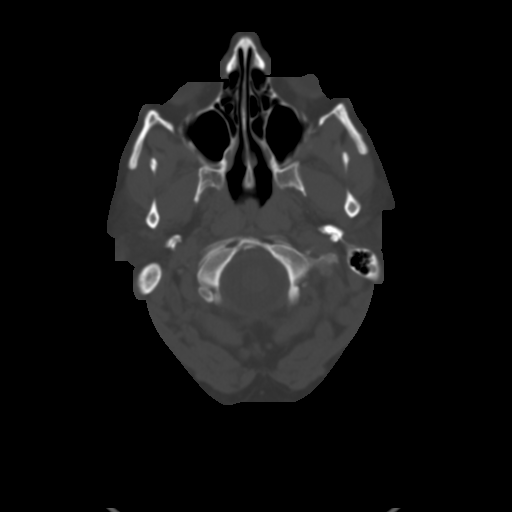
[im 4/36  brain]
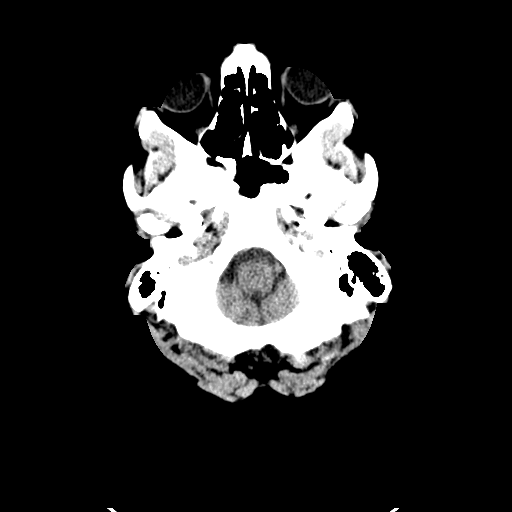
[im 7/36  brain]
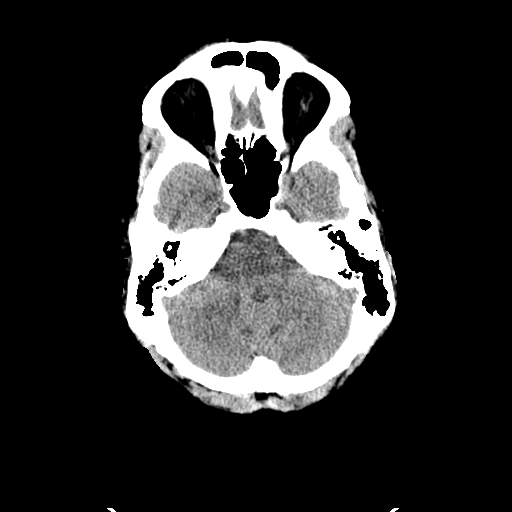
[im 9/36  brain]
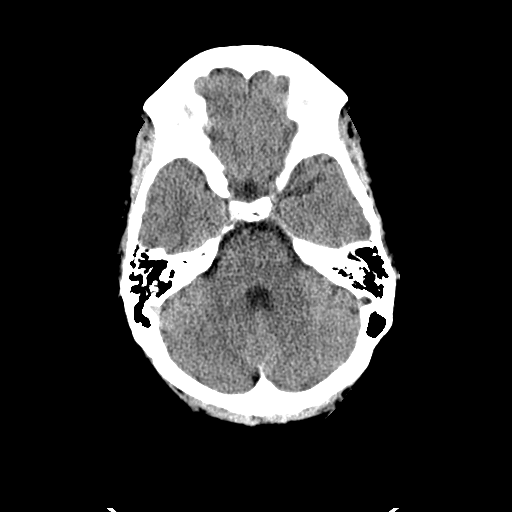
[im 10/36  brain]
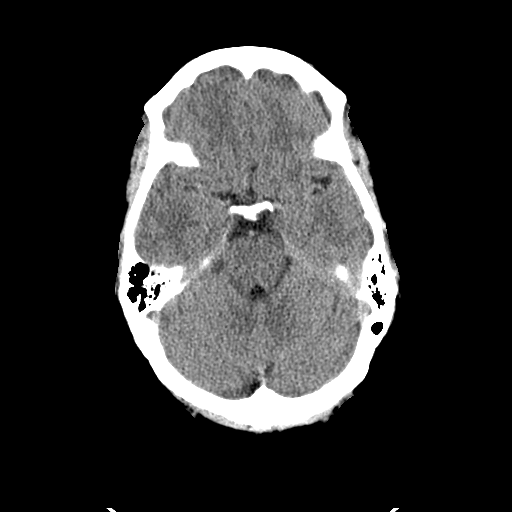
[im 10/36  bone]
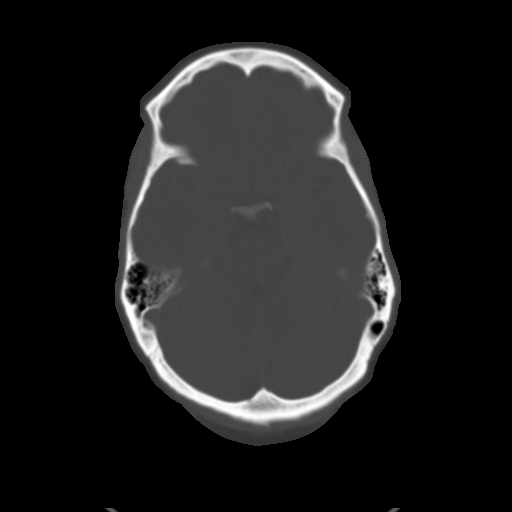
[im 13/36  brain]
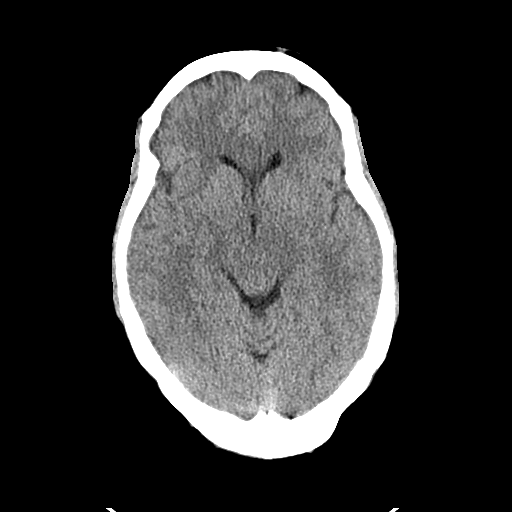
[im 15/36  brain]
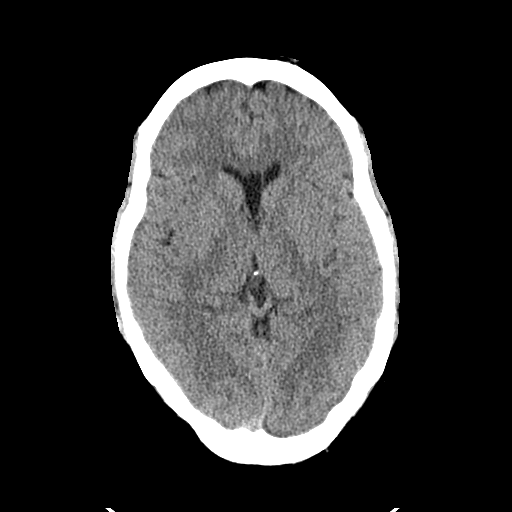
[im 17/36  brain]
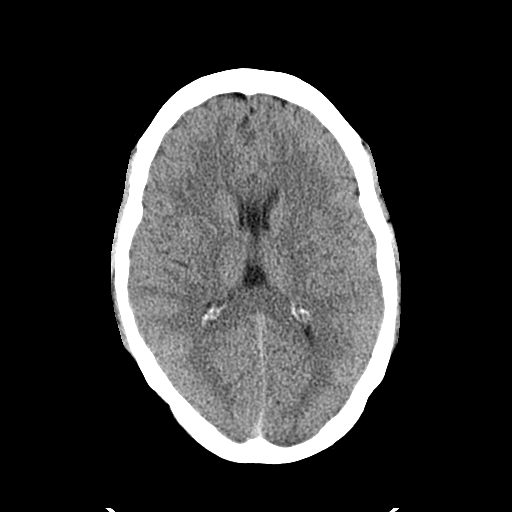
[im 19/36  brain]
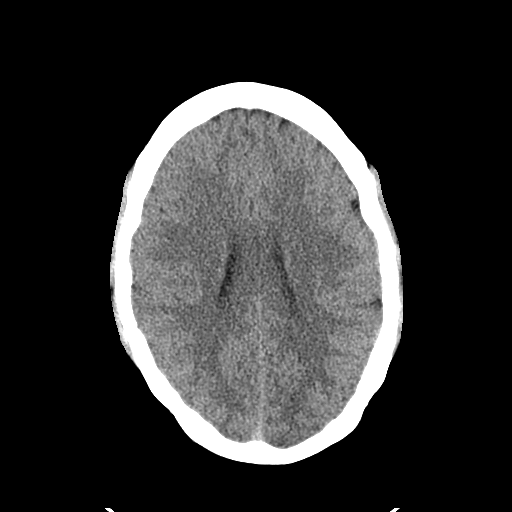
[im 19/36  bone]
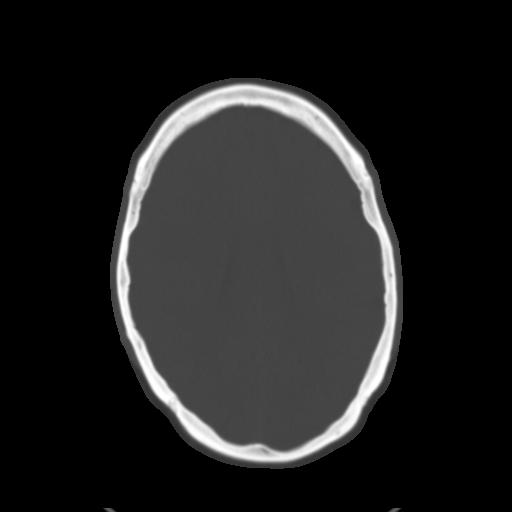
[im 21/36  brain]
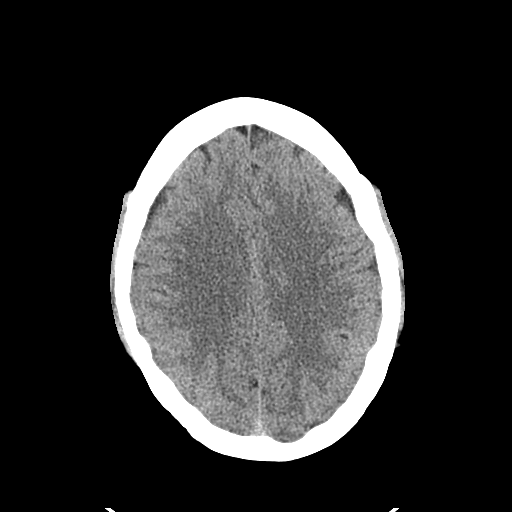
[im 23/36  brain]
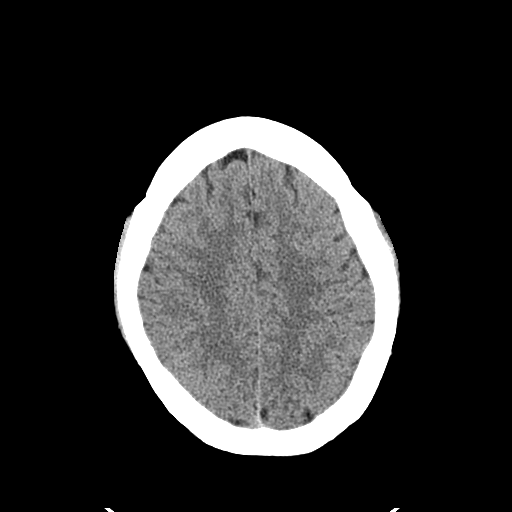
[im 26/36  brain]
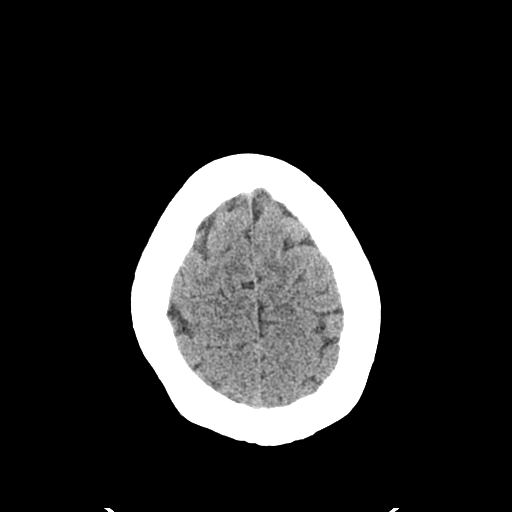
[im 27/36  brain]
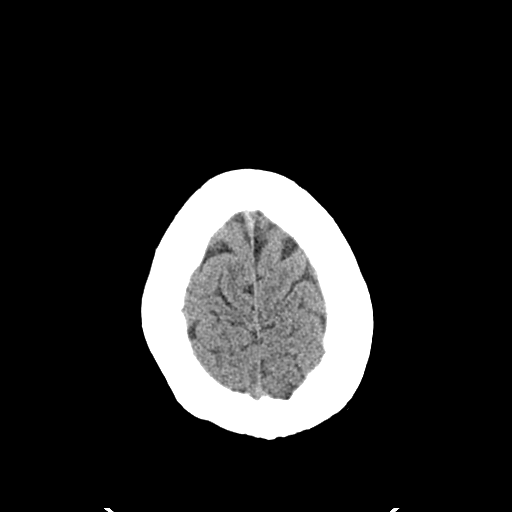
[im 27/36  bone]
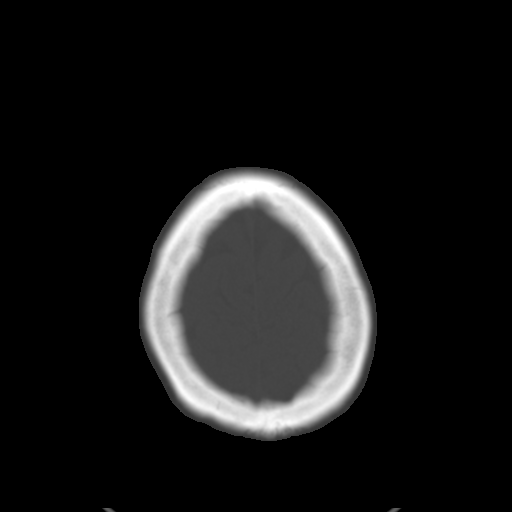
[im 29/36  brain]
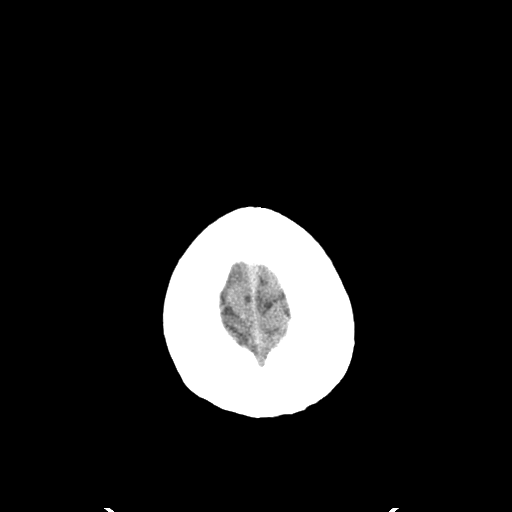
[im 32/36  brain]
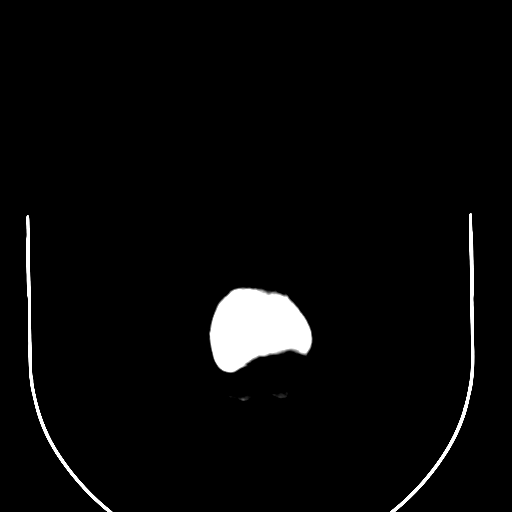
[im 34/36  brain]
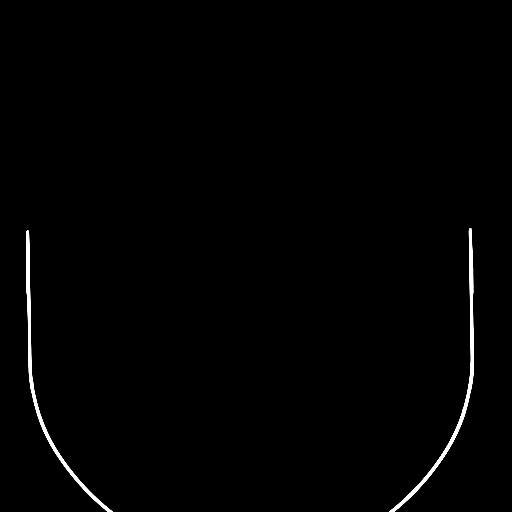

[16 of 30 positions shown; findings below may reference images not displayed]

FINDINGS: There is no evidence of acute intracranial abnormality including
hemorrhage, infarct, mass lesion, mass effect, midline shift or
abnormal extra-axial fluid collection. Cavum septum pellucidum at
vergae as noted. No pneumocephalus or hydrocephalus. The calvarium
is intact. Imaged paranasal sinuses and mastoid air cells are clear.
IMPRESSION: Negative head CT.

## 2016-02-25 ENCOUNTER — Ambulatory Visit (HOSPITAL_COMMUNITY)
Admission: RE | Admit: 2016-02-25 | Discharge: 2016-02-25 | Disposition: A | Discharge status: Home / Self Care | Payer: BLUE CROSS/BLUE SHIELD | Source: Ambulatory Visit | Attending: Oncology | Admitting: Oncology

## 2016-02-25 ENCOUNTER — Other Ambulatory Visit: Payer: Self-pay | Admitting: Oncology

## 2016-02-25 ENCOUNTER — Other Ambulatory Visit (HOSPITAL_BASED_OUTPATIENT_CLINIC_OR_DEPARTMENT_OTHER): Payer: BLUE CROSS/BLUE SHIELD

## 2016-02-25 ENCOUNTER — Ambulatory Visit (HOSPITAL_BASED_OUTPATIENT_CLINIC_OR_DEPARTMENT_OTHER): Payer: BLUE CROSS/BLUE SHIELD | Admitting: Oncology

## 2016-02-25 VITALS — BP 128/84 | HR 78 | Temp 98.1°F | Resp 18 | Ht 67.0 in | Wt 186.7 lb

## 2016-02-25 DIAGNOSIS — R0781 Pleurodynia: Secondary | ICD-10-CM

## 2016-02-25 DIAGNOSIS — D0511 Intraductal carcinoma in situ of right breast: Secondary | ICD-10-CM | POA: Diagnosis not present

## 2016-02-25 DIAGNOSIS — C73 Malignant neoplasm of thyroid gland: Secondary | ICD-10-CM

## 2016-02-25 DIAGNOSIS — Q999 Chromosomal abnormality, unspecified: Secondary | ICD-10-CM

## 2016-02-25 DIAGNOSIS — Z170 Estrogen receptor positive status [ER+]: Secondary | ICD-10-CM

## 2016-02-25 DIAGNOSIS — Z85850 Personal history of malignant neoplasm of thyroid: Secondary | ICD-10-CM

## 2016-02-25 DIAGNOSIS — C50311 Malignant neoplasm of lower-inner quadrant of right female breast: Secondary | ICD-10-CM

## 2016-02-25 LAB — CBC WITH DIFFERENTIAL/PLATELET
BASO%: 0.3 % (ref 0.0–2.0)
Basophils Absolute: 0 10*3/uL (ref 0.0–0.1)
EOS%: 0.1 % (ref 0.0–7.0)
Eosinophils Absolute: 0 10*3/uL (ref 0.0–0.5)
HCT: 39.2 % (ref 34.8–46.6)
HGB: 13.7 g/dL (ref 11.6–15.9)
LYMPH%: 17 % (ref 14.0–49.7)
MCH: 29.7 pg (ref 25.1–34.0)
MCHC: 34.9 g/dL (ref 31.5–36.0)
MCV: 85 fL (ref 79.5–101.0)
MONO#: 0.5 10*3/uL (ref 0.1–0.9)
MONO%: 6.6 % (ref 0.0–14.0)
NEUT%: 76 % (ref 38.4–76.8)
NEUTROS ABS: 5.5 10*3/uL (ref 1.5–6.5)
PLATELETS: 242 10*3/uL (ref 145–400)
RBC: 4.61 10*6/uL (ref 3.70–5.45)
RDW: 12.4 % (ref 11.2–14.5)
WBC: 7.3 10*3/uL (ref 3.9–10.3)
lymph#: 1.2 10*3/uL (ref 0.9–3.3)

## 2016-02-25 LAB — COMPREHENSIVE METABOLIC PANEL
ALT: 29 U/L (ref 0–55)
ANION GAP: 6 meq/L (ref 3–11)
AST: 21 U/L (ref 5–34)
Albumin: 4 g/dL (ref 3.5–5.0)
Alkaline Phosphatase: 100 U/L (ref 40–150)
BUN: 9.7 mg/dL (ref 7.0–26.0)
CHLORIDE: 106 meq/L (ref 98–109)
CO2: 26 meq/L (ref 22–29)
Calcium: 9.6 mg/dL (ref 8.4–10.4)
Creatinine: 0.8 mg/dL (ref 0.6–1.1)
EGFR: 84 mL/min/{1.73_m2} — AB (ref >90)
GLUCOSE: 93 mg/dL (ref 70–140)
Potassium: 4.1 mEq/L (ref 3.5–5.1)
SODIUM: 138 meq/L (ref 136–145)
Total Bilirubin: 2.22 mg/dL — ABNORMAL HIGH (ref 0.20–1.20)
Total Protein: 6.8 g/dL (ref 6.4–8.3)

## 2016-02-25 NOTE — Progress Notes (Signed)
[name]  [phone] Fax:(336) [phone]     ID: [name] DOB: 05-18-1970  MR#: [id number]  [id number]  Patient Care Team: Debbrah Alar, NP as PCP - General (Internal Medicine) Kathyrn Lass, MD (Family Medicine) Rolm Bookbinder, MD as Consulting Physician (General Surgery) Irene Limbo, MD as Consulting Physician (Plastic Surgery) Sherren Mocha, MD as Consulting Physician (Cardiology) Devra Dopp, MD as Referring Physician (Dermatology) Jacinto Reap, MD (Plastic Surgery) Jola Schmidt, MD as Consulting Physician (Ophthalmology) PCP: Nance Pear., NP GYN: SU:  OTHER MD:  CHIEF COMPLAINT: Right-sided ductal carcinoma in situ, status post mastectomy  CURRENT TREATMENT: Screening for melanoma and pancreatic cancer in the setting of CDKN2A mutation   BREAST CANCER HISTORY: From the prior intake note:  [name] had bilateral screening mammography at a mobile clinic 11/16/2014. This showed a focal density in the lower inner quadrant of the right breast and she was recalled for diagnostic right mammography and right breast ultrasonography at [facility] 11/28/2014. This showed the breast density to be category B. In the lower inner right breast there was a group of amorphous calcifications spanning approximately 3 cm. These were new as compared to mammography from 2014. Ultrasound of the area showed an ill-defined region of hypoechoic tissue but no definite mass.  Biopsy of the right breast mass in question 12/05/2014 showed ([id number]) ductal carcinoma in situ, grade 1, arising within an intraductal papilloma. The cells were estrogen receptor 9200% positive, progesterone receptor 80-90% positive, both with strong staining intensity.  The patient's subsequent history is as detailed below.  INTERVAL HISTORY: [name] returns today for follow-up of her high-risk genetic mutation. She is  following with Dr. Renda Rolls twice a year as far as the melanoma is concerned, and she also gets an eye exam at least yearly for melanoma screening. She had an MRI/MRCP for pancreatic cancer screening in October and that was benign.   REVIEW OF SYSTEMS: [name] has had a pain in the area under her left breast for about 3 weeks. She is not aware of any change in activity or any trauma. It is better with stretching. It is not related to broad changes which she tried. Aside from this issue she is having some knee problems possibly because she is been trying to jog for weight control. She is followed by Dr. Rhona Jackson for this. Aside from these issues she had the flu about 6 weeks ago for has fully recovered. She has had no changes in bowel or bladder habits, currently no fever or rash or bleeding, and her weight is stable. A detailed review of systems today was otherwise unchanged.   PAST MEDICAL HISTORY: Past Medical History:  Diagnosis Date  . Arthritis   . Breast cancer [facility]) 12/2014   Seeing [facility] doctors  . CAD (coronary artery disease)    open heart surgery 09/12/10  . Cancer [facility]) 2007   thyroid  . GERD (gastroesophageal reflux disease)   . Heart disease   . History of thyroid cancer   . Hypothyroid   . Overweight(278.02)     PAST SURGICAL HISTORY: Past Surgical History:  Procedure Laterality Date  . arm surgery     rt  . coronary artery bypass grafting x1  09/13/10   [place] Gum  . HERNIA REPAIR  1977   inguinal, ?side  . KNEE ARTHROSCOPY Right 09/13/2015   pt reported  . THYROIDECTOMY    . [place], 2002    FAMILY HISTORY  Family History  Problem Relation Age of Onset  . Hypertension Mother   . Colon polyps Mother   . Arthritis Mother   . Cancer Mother     breast  . Hypertension Father   . Hiatal hernia Father   . Hypertension Sister   . Lupus Maternal Aunt   . Heart disease Maternal Aunt   . Diabetes Maternal Aunt   . Arthritis Paternal  Grandmother   . Diabetes Paternal Grandmother   . Parkinson's disease Paternal Grandmother   . Coronary artery disease Maternal Grandfather   . Cancer Maternal Aunt     breast, ovarian  . Diabetes Maternal Aunt   . Other Maternal Aunt     ?auto immune disease  . Colon cancer Neg Hx   the patient's parents are in their late 69s as of December 2016. The patient's mother was diagnosed with breast cancer age 67; on the maternal side, the maternal grandmother was diagnosed with uterine cancer at age 63. 2 maternal aunts had one breast cancer and one uterine cancer and one maternal uncle had colon cancer is were in their 22s and 53s. On the father's side there is an uncle with colon cancer age 41. There is no history of ovarian cancer to the patient's knowledge   GYNECOLOGIC HISTORY:  Patient's last menstrual period was 02/04/2016. Menarche age 63, first live birth age 67. The patient is GX P2. She still having regular periods. She has a Mirena IUD in place.  SOCIAL HISTORY:  Bryden works as a Dealer. Her husband [name] is an Brewing technologist. There is son [name] from [employer] in [place]. Their daughter [name] is a Museum/gallery exhibitions officer at [employer]: Not in place   HEALTH MAINTENANCE: Social History  Substance Use Topics  . Smoking status: Never Smoker  . Smokeless tobacco: Never Used  . Alcohol use Yes     Comment: rarely     Colonoscopy:  PAP: 2013  Bone density:  Lipid panel:  No Known Allergies  Current Outpatient Prescriptions  Medication Sig Dispense Refill  . aspirin 81 MG tablet Take 81 mg by mouth every morning. Reported on 02/21/2015    . diphenhydrAMINE (BENADRYL) 25 mg capsule Take 25 mg by mouth at bedtime.     Marland Kitchen levonorgestrel (MIRENA) 20 MCG/24HR IUD 1 each by Intrauterine route once.      Marland Kitchen levothyroxine (SYNTHROID, LEVOTHROID) 150 MCG tablet Take 1 tablet (150 mcg total) by mouth every morning. 30 tablet 11    . meloxicam (MOBIC) 7.5 MG tablet Take 1 tablet (7.5 mg total) by mouth daily. 14 tablet 0   No current facility-administered medications for this visit.     OBJECTIVE: Middle-aged white woman In no acute distress Vitals:   02/25/16 1445  BP: 128/84  Pulse: 78  Resp: 18  Temp: 98.1 F (36.7 C)     Body mass index is 29.24 kg/m.    ECOG FS:1 - Symptomatic but completely ambulatory  Sclerae unicteric, pupils round and equal Oropharynx clear and moist-- no thrush or other lesions No cervical or supraclavicular adenopathy Lungs no rales or rhonchi Heart regular rate and rhythm Abd soft, nontender, positive bowel sounds MSK no focal spinal tenderness, no upper extremity lymphedema; focal tenderness around the fifth or sixth rib on the left slightly lateral to the midclavicular line, below the breast Neuro: nonfocal, well oriented, appropriate affect Breasts: Status post bilateral mastectomies with DIEP reconstruction. No evidence of local recurrence.  Both axillae are benign.  LAB RESULTS:  CMP     Component Value Date/Time   NA 138 02/25/2016 1421   K 4.1 02/25/2016 1421   CL 108 02/12/2014 1740   CO2 26 02/25/2016 1421   GLUCOSE 93 02/25/2016 1421   BUN 9.7 02/25/2016 1421   CREATININE 0.8 02/25/2016 1421   CALCIUM 9.6 02/25/2016 1421   PROT 6.8 02/25/2016 1421   ALBUMIN 4.0 02/25/2016 1421   AST 21 02/25/2016 1421   ALT 29 02/25/2016 1421   ALKPHOS 100 02/25/2016 1421   BILITOT 2.22 (H) 02/25/2016 1421   GFRNONAA 82 (L) 02/12/2014 1740   GFRAA >90 02/12/2014 1740    INo results found for: SPEP, UPEP  Lab Results  Component Value Date   WBC 7.3 02/25/2016   NEUTROABS 5.5 02/25/2016   HGB 13.7 02/25/2016   HCT 39.2 02/25/2016   MCV 85.0 02/25/2016   PLT 242 02/25/2016      Chemistry      Component Value Date/Time   NA 138 02/25/2016 1421   K 4.1 02/25/2016 1421   CL 108 02/12/2014 1740   CO2 26 02/25/2016 1421   BUN 9.7 02/25/2016 1421   CREATININE  0.8 02/25/2016 1421      Component Value Date/Time   CALCIUM 9.6 02/25/2016 1421   ALKPHOS 100 02/25/2016 1421   AST 21 02/25/2016 1421   ALT 29 02/25/2016 1421   BILITOT 2.22 (H) 02/25/2016 1421       No results found for: LABCA2  No components found for: LABCA125  No results for input(s): INR in the last 168 hours.  Urinalysis    Component Value Date/Time   COLORURINE DARK YELLOW 02/20/2015 0705   APPEARANCEUR TURBID (A) 02/20/2015 0705   LABSPEC 1.023 02/20/2015 0705   PHURINE 5.5 02/20/2015 0705   GLUCOSEU NEGATIVE 02/20/2015 0705   GLUCOSEU NEGATIVE 01/30/2015 1428   HGBUR NEGATIVE 02/20/2015 0705   BILIRUBINUR NEGATIVE 02/20/2015 0705   KETONESUR NEGATIVE 02/20/2015 0705   PROTEINUR NEGATIVE 02/20/2015 0705   UROBILINOGEN 0.2 01/30/2015 1428   NITRITE NEGATIVE 02/20/2015 0705   LEUKOCYTESUR NEGATIVE 02/20/2015 0705    STUDIES: Dg Ribs Unilateral W/chest Left  Result Date: 02/25/2016 CLINICAL DATA:  Left anterior rib pain under breast area today. No known injury. BB marker placed on pt. Ductal carcinoma in situ of right breast. Thyroid cancer. EXAM: LEFT RIBS AND CHEST - 3+ VIEW COMPARISON:  08/15/2014 FINDINGS: No fracture or other bone lesions are seen involving the ribs. There is no evidence of pneumothorax or pleural effusion. Both lungs are clear. Heart size and mediastinal contours are within normal limits. Previous median sternotomy and CABG. Surgical clips in the left breast. IMPRESSION: 1. No displaced fracture or pneumothorax. Electronically Signed   By: Lucrezia Europe M.D.   On: 02/25/2016 19:08    ASSESSMENT: 46 y.o. 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  The patient  does not carry theCHEK2  Deletion present in her mother.  (a)  Patients who carry a  CDKN2A mutation are at high risk of melanoma  (28-76% lifetime risk) and pancreatic cancer (17% lifetime risk).  (2)  Status post bilateral mastectomies with immediate DIEP reconstruction 02/24//2017 showing  (a)  On the left side, benign breast tissue  (  b)  On the right side, pTis pN0, stage 0 DCIS  measuring, 2.8 cm, estrogen receptor and progesterone receptor positive,   (3)  Screening for melanoma with by annual skin exam is recommended ([place])   (4) screening for pancreatic cancer will consist of by annual MRI,  with a baseline ERCP  (a) abdominal MRI 04/12/2017shows only cholelithiasis and mild hepatomegaly  (b) abdominal MRI/MRCP 10/17/2015 showed normal pancreas, mild hepatomegaly, and cholelithiasis  (5) Mirena IUD in place   PLAN:  Floride is now one year out from definitive surgery for her noninvasive breast cancer which is most likely cured.  We are following her with intensified screening because of her significant risk of melanoma and pancreatic cancer. She sees dermatology twice a year and ophthalmology at least once a year.  We are going to be obtaining MRIs of the pancreas area on a yearly basis. Her next one will be in October and I will see her shortly after that.  As far as the pain in her left rib cage area is concerned I think she very likely cracked a rib. He tells me she does have a balance problem and frequently runs into things. Were going to obtain some plain films of that area today. I do not expect this to be significant otherwise  She knows to call for any problems that may develop before her next visit here.  Chauncey Cruel, MD   02/25/2016 7:40 PM Medical Oncology and Hematology [facility] [address] [place], [place] [address] Tel. [phone]    Fax. [phone]

## 2016-03-01 ENCOUNTER — Telehealth: Payer: Self-pay | Admitting: Oncology

## 2016-03-01 NOTE — Telephone Encounter (Signed)
Lvm advising appts 10/9 @ 8.45 lab and 10/16 @ 1pm md. advised in vm Radiology will call to schedule MRI closer to Oct.

## 2016-03-05 ENCOUNTER — Encounter (HOSPITAL_COMMUNITY): Payer: Self-pay

## 2016-03-12 ENCOUNTER — Encounter: Payer: Self-pay | Admitting: Family

## 2016-03-12 ENCOUNTER — Ambulatory Visit (INDEPENDENT_AMBULATORY_CARE_PROVIDER_SITE_OTHER): Payer: BLUE CROSS/BLUE SHIELD | Admitting: Family

## 2016-03-12 VITALS — BP 116/80 | HR 82 | Temp 98.3°F | Resp 16 | Ht 67.0 in | Wt 188.6 lb

## 2016-03-12 DIAGNOSIS — M7918 Myalgia, other site: Secondary | ICD-10-CM

## 2016-03-12 DIAGNOSIS — M791 Myalgia: Secondary | ICD-10-CM

## 2016-03-12 DIAGNOSIS — Z975 Presence of (intrauterine) contraceptive device: Secondary | ICD-10-CM

## 2016-03-12 MED ORDER — MELOXICAM 7.5 MG PO TABS: 7.5000 mg | ORAL_TABLET | Freq: Every day | ORAL | 0 refills | Status: DC

## 2016-03-12 NOTE — Progress Notes (Signed)
Subjective:    Patient ID: [name], female    DOB: January 13, 1971, 46 y.o.   MRN: [id number]  HPI  [name] is a 46 yr old female who presents today with c/o tenderness left anterior ribs.  Area is tender to the touch. Stretching upright helps. Began 6 weeks ago. Had an x-ray of the area performed by her oncologist which showed normal bones, no fracture.  Has mirena which will expire 5/18 and request referral to GYN.   Review of Systems See HPI  Past Medical History:  Diagnosis Date  . Arthritis   . Breast cancer [facility]) 12/2014   Seeing [facility] doctors  . CAD (coronary artery disease)    open heart surgery 09/12/10  . Cancer [facility]) 2007   thyroid  . GERD (gastroesophageal reflux disease)   . Heart disease   . History of thyroid cancer   . Hypothyroid   . Overweight(278.02)      Social History   Social History  . Marital status: Married    Spouse name: N/A  . Number of children: 2  . Years of education: 16   Occupational History  . IT [employer]   Social History Main Topics  . Smoking status: Never Smoker  . Smokeless tobacco: Never Used  . Alcohol use Yes     Comment: rarely  . Drug use: No  . Sexual activity: Yes    Birth control/ protection: IUD   Other Topics Concern  . Not on file   Social History Narrative   Regular exercise-yes   Caffeine Use-yes   Works as Patent examiner   [phone]- son   Married   Enjoys Marine scientist and a Scientist, research (physical sciences) degree    Past Surgical History:  Procedure Laterality Date  . arm surgery     rt  . coronary artery bypass grafting x1  09/13/10   [place] Gum  . HERNIA REPAIR  1977   inguinal, ?side  . KNEE ARTHROSCOPY Right 09/13/2015   pt reported  . THYROIDECTOMY    . ULNAR NERVE REPAIR Right 1989, 2002    Family History  Problem Relation Age of Onset  . Hypertension Mother   . Colon polyps Mother   . Arthritis Mother   . Cancer Mother     breast  .  Hypertension Father   . Hiatal hernia Father   . Hypertension Sister   . Lupus Maternal Aunt   . Heart disease Maternal Aunt   . Diabetes Maternal Aunt   . Arthritis Paternal Grandmother   . Diabetes Paternal Grandmother   . Parkinson's disease Paternal Grandmother   . Coronary artery disease Maternal Grandfather   . Cancer Maternal Aunt     breast, ovarian  . Diabetes Maternal Aunt   . Other Maternal Aunt     ?auto immune disease  . Colon cancer Neg Hx     No Known Allergies  Current Outpatient Prescriptions on File Prior to Visit  Medication Sig Dispense Refill  . aspirin 81 MG tablet Take 81 mg by mouth every morning. Reported on 02/21/2015    . diphenhydrAMINE (BENADRYL) 25 mg capsule Take 25 mg by mouth at bedtime.     Marland Kitchen levonorgestrel (MIRENA) 20 MCG/24HR IUD 1 each by Intrauterine route once.      Marland Kitchen levothyroxine (SYNTHROID, LEVOTHROID) 150 MCG tablet Take 1 tablet (150 mcg total) by mouth every morning. 30 tablet 11  No current facility-administered medications on file prior to visit.     BP 116/80 (BP Location: Left Arm, Cuff Size: Normal)   Pulse 82   Temp 98.3 F (36.8 C) (Oral)   Resp 16   Ht 5\' 7"  (1.702 m)   Wt 188 lb 9.6 oz (85.5 kg)   LMP 03/12/2016   SpO2 99% Comment: room air  BMI 29.54 kg/m       Objective:   Physical Exam  Constitutional: She is oriented to person, place, and time. She appears well-developed and well-nourished.  Cardiovascular: Normal rate, regular rhythm and normal heart sounds.   No murmur heard. Pulmonary/Chest: Effort normal and breath sounds normal. No respiratory distress. She has no wheezes.  Abdominal: Soft. Bowel sounds are normal. She exhibits no distension.  Mild tenderness to palpation overlying left anterior 7/8 th rib.   Musculoskeletal: She exhibits no edema.  Neurological: She is alert and oriented to person, place, and time.  Psychiatric: She has a normal mood and affect. Her behavior is normal. Judgment and  thought content normal.          Assessment & Plan:  IUD- refer to gyn for replacement of her mirena.  Musculoskeletal pain- trial of meloxicam, pt advised to call if new/worsening symptoms or if tenderness not improved in a few weeks.

## 2016-03-12 NOTE — Patient Instructions (Signed)
Begin meloxicam.  Call if pain worsens or if not improved in a few weeks.

## 2016-03-12 NOTE — Progress Notes (Signed)
Pre visit review using our clinic review tool, if applicable. No additional management support is needed unless otherwise documented below in the visit note. 

## 2016-03-20 DIAGNOSIS — N6324 Unspecified lump in the left breast, lower inner quadrant: Secondary | ICD-10-CM | POA: Diagnosis not present

## 2016-03-20 DIAGNOSIS — Z9013 Acquired absence of bilateral breasts and nipples: Secondary | ICD-10-CM | POA: Diagnosis not present

## 2016-03-20 DIAGNOSIS — N63 Unspecified lump in unspecified breast: Secondary | ICD-10-CM | POA: Diagnosis not present

## 2016-03-20 DIAGNOSIS — Z86000 Personal history of in-situ neoplasm of breast: Secondary | ICD-10-CM | POA: Diagnosis not present

## 2016-04-03 ENCOUNTER — Ambulatory Visit (INDEPENDENT_AMBULATORY_CARE_PROVIDER_SITE_OTHER): Payer: BLUE CROSS/BLUE SHIELD | Admitting: Family Medicine

## 2016-04-03 ENCOUNTER — Other Ambulatory Visit (HOSPITAL_COMMUNITY)
Admission: RE | Admit: 2016-04-03 | Discharge: 2016-04-03 | Disposition: A | Discharge status: Home / Self Care | Payer: BLUE CROSS/BLUE SHIELD | Source: Ambulatory Visit | Attending: Family Medicine | Admitting: Family Medicine

## 2016-04-03 ENCOUNTER — Encounter: Payer: Self-pay | Admitting: Family Medicine

## 2016-04-03 VITALS — BP 132/81 | HR 68 | Ht 68.0 in | Wt 189.0 lb

## 2016-04-03 DIAGNOSIS — Z30433 Encounter for removal and reinsertion of intrauterine contraceptive device: Secondary | ICD-10-CM | POA: Diagnosis not present

## 2016-04-03 DIAGNOSIS — N841 Polyp of cervix uteri: Secondary | ICD-10-CM | POA: Insufficient documentation

## 2016-04-03 DIAGNOSIS — N888 Other specified noninflammatory disorders of cervix uteri: Secondary | ICD-10-CM | POA: Diagnosis not present

## 2016-04-03 MED ORDER — LEVONORGESTREL 20 MCG/24HR IU IUD
INTRAUTERINE_SYSTEM | Freq: Once | INTRAUTERINE | Status: AC
  Administered 2016-04-03: 11:00:00 1 via INTRAUTERINE

## 2016-04-03 NOTE — Progress Notes (Signed)
Patient has Ln IUD due to bleeding. Has noticed a little spotting recently and slightly heavier periods. Otherwise, IUD has worked well. Normal PAP smear 01/2015.  IUD Removal  Patient was in the dorsal lithotomy position, normal external genitalia was noted.  A speculum was placed in the patient's vagina, normal discharge was noted, no lesions. The multiparous cervix was visualized, no lesions, no abnormal discharge,  and was swabbed with Betadine using scopettes.  The strings of the IUD was grasped and pulled using ring forceps.  The IUD was successfully removed in its entirety.  Patient tolerated the procedure well.    On exam, a 2.5cm mass in the external os was visualized at 12 o'clock. The tissue was very friable and a wide based stalk connecting the mass to the cervix. A biopsy was taken of the mass. Bleed was stopped using a combination of monsels and silver nitrite.  IUD Procedure Note Patient identified, informed consent performed, signed copy in chart, time out was performed.  Urine pregnancy test negative.  Speculum placed in the vagina.  Cervix visualized.  Cleaned with Betadine x 2.  Grasped anteriorly with a single tooth tenaculum.  Uterus sounded to 8 cm.  Mirena  IUD placed per manufacturer's recommendations.  Strings trimmed to 3 cm. Tenaculum was removed, good hemostasis noted.  Patient tolerated procedure well.   Patient given post procedure instructions and Mirena care card with expiration date.  Patient is asked to check IUD strings periodically and follow up in 4-6 weeks for IUD check.

## 2016-04-03 NOTE — Patient Instructions (Signed)

## 2016-04-17 ENCOUNTER — Encounter: Payer: Self-pay | Admitting: Cardiovascular Disease

## 2016-04-28 ENCOUNTER — Ambulatory Visit (INDEPENDENT_AMBULATORY_CARE_PROVIDER_SITE_OTHER): Payer: BLUE CROSS/BLUE SHIELD | Admitting: Family Medicine

## 2016-04-28 ENCOUNTER — Encounter: Payer: Self-pay | Admitting: Family Medicine

## 2016-04-28 VITALS — BP 117/72 | HR 87 | Ht 68.0 in | Wt 190.0 lb

## 2016-04-28 DIAGNOSIS — N841 Polyp of cervix uteri: Secondary | ICD-10-CM | POA: Diagnosis not present

## 2016-04-28 DIAGNOSIS — Z30431 Encounter for routine checking of intrauterine contraceptive device: Secondary | ICD-10-CM

## 2016-04-28 NOTE — Progress Notes (Signed)
   Subjective:   Patient Name: [name], female   DOB: [birth year], 46 y.o.  MRN: [id number]  HPI Patient here for an IUD check.  She had the Mirena IUD placed 1 month ago.  She reports no problems.   Review of Systems  Constitutional: Negative for fever and chills.  Gastrointestinal: Negative for abdominal pain.  Genitourinary: Negative for vaginal discharge, vaginal pain, pelvic pain and dyspareunia.        Objective:   Physical Exam  Constitutional: She appears well-developed and well-nourished.  HENT:  Head: Normocephalic and atraumatic.  Abdominal: Soft. There is no tenderness. There is no guarding.  Genitourinary: There is no rash, tenderness or lesion on the right labia. There is no rash, tenderness or lesion on the left labia. No erythema or tenderness in the vagina. No foreign body around the vagina. No signs of injury around the vagina. No vaginal discharge found.    Skin: Skin is warm and dry.  Psychiatric: She has a normal mood and affect. Her behavior is normal. Judgment and thought content normal.       Assessment & Plan:  1. IUD check up IUD in place.  Pt to call with any other problems.  Recheck in 1 year.  2. Cervical polyp  Only has spotting after intercourse. No pain. Discussed removal, which would likely be better to do in the OR due to wide base and possible extensive bleeding vs leaving it alone. Pt prefers to leave it for now.

## 2016-04-28 NOTE — Progress Notes (Signed)
Patient presents for string check. Kathrene Alu RNBSN

## 2016-05-01 DIAGNOSIS — N641 Fat necrosis of breast: Secondary | ICD-10-CM | POA: Diagnosis not present

## 2016-05-02 ENCOUNTER — Encounter: Payer: Self-pay | Admitting: Cardiovascular Disease

## 2016-05-02 ENCOUNTER — Ambulatory Visit (INDEPENDENT_AMBULATORY_CARE_PROVIDER_SITE_OTHER): Payer: BLUE CROSS/BLUE SHIELD | Admitting: Cardiovascular Disease

## 2016-05-02 VITALS — BP 122/70 | HR 84 | Ht 68.0 in | Wt 188.4 lb

## 2016-05-02 DIAGNOSIS — I25810 Atherosclerosis of coronary artery bypass graft(s) without angina pectoris: Secondary | ICD-10-CM | POA: Diagnosis not present

## 2016-05-02 NOTE — Progress Notes (Signed)
Cardiology Office Note Date:  05/02/2016   ID:  CHAMARA DYCK, DOB 1970/08/25, MRN [id number]  PCP:  Nance Pear., NP  Cardiologist:  Sherren Mocha, MD    Chief Complaint  Patient presents with  . Coronary Artery Disease    follow up     History of Present Illness: [name] is a 46 y.o. female who presents for follow-up of CAD. 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 She underwent single-vessel CABG. Follow-up gated cardiac CTA demonstrated healing of the native RCA and continued patency of the saphenous vein graft RCA. LV function at follow-up has been normal with an ejection fraction of 55-60%. The patient was diagnosed with ductal carcinoma in situ of the right breast in 2016. In 2017 she underwent bilateral mastectomies and reconstructive surgeries. She did not have any cardiac complications related to surgery. She apparently has a genetic predisposition for melanoma and pancreatic cancer. She has been placed on a screening regimen. She is followed by Dr. Jana Hakim. She presents today for annual cardiology follow-up.  The patient is here alone today. She is doing very well. She is exercising regularly. Has some questions about maximum heart rate since her heart rate at peak exercise oftentimes exceeds 150 bpm. Today, she denies symptoms of palpitations, chest pain, shortness of breath, orthopnea, PND, lower extremity edema, dizziness, or syncope. She forgets to take ASA frequently. No bleeding problems reported.   Past Medical History:  Diagnosis Date  . Arthritis   . Breast cancer [facility]) 12/2014   Seeing [facility] doctors  . CAD (coronary artery disease)    open heart surgery 09/12/10  . Cancer [facility]) 2007   thyroid  . GERD (gastroesophageal reflux disease)   . Heart disease   . History of thyroid cancer   . Hypothyroid   .  Overweight(278.02)     Past Surgical History:  Procedure Laterality Date  . arm surgery     rt  . coronary artery bypass grafting x1  09/13/10   [place] Gum  . HERNIA REPAIR  1977   inguinal, ?side  . KNEE ARTHROSCOPY Right 09/13/2015   pt reported  . THYROIDECTOMY    . ULNAR NERVE REPAIR Right 1989, 2002    Current Outpatient Prescriptions  Medication Sig Dispense Refill  . aspirin 81 MG tablet Take 81 mg by mouth every morning. Reported on 02/21/2015    . diphenhydrAMINE (BENADRYL) 25 mg capsule Take 25 mg by mouth at bedtime.     Marland Kitchen levonorgestrel (MIRENA) 20 MCG/24HR IUD 1 each by Intrauterine route once.      Marland Kitchen levothyroxine (SYNTHROID, LEVOTHROID) 150 MCG tablet Take 1 tablet (150 mcg total) by mouth every morning. 30 tablet 11   No current facility-administered medications for this visit.     Allergies:   Patient has no known allergies.   Social History:  The patient  reports that she has never smoked. She has never used smokeless tobacco. She reports that she drinks alcohol. She reports that she does not use drugs.   Family History:  The patient's  family history includes Arthritis in her mother and paternal grandmother; Cancer in her maternal aunt and mother; Colon polyps in her mother; Coronary artery disease in her maternal grandfather; Diabetes in her maternal aunt, maternal aunt, and paternal grandmother; Heart disease in her maternal aunt; Hiatal hernia in her father; Hypertension in her father, mother, and  sister; Lupus in her maternal aunt; Other in her maternal aunt; Parkinson's disease in her paternal grandmother.    ROS:  Please see the history of present illness.  All other systems are reviewed and negative.    PHYSICAL EXAM: VS:  BP 122/70   Pulse 84   Ht 5\' 8"  (1.727 m)   Wt 188 lb 6.4 oz (85.5 kg)   BMI 28.65 kg/m  , BMI Body mass index is 28.65 kg/m. GEN: Well nourished, well developed, in no acute distress  HEENT: normal  Neck: no JVD, no masses. No  carotid bruits Cardiac: RRR without murmur or gallop                Respiratory:  clear to auscultation bilaterally, normal work of breathing GI: soft, nontender, nondistended, + BS MS: no deformity or atrophy  Ext: no pretibial edema, pedal pulses 2+= bilaterally Skin: warm and dry, no rash Neuro:  Strength and sensation are intact Psych: euthymic mood, full affect  EKG:  EKG is ordered today. The ekg ordered today shows normal sinus rhythm 85 bpm, first-degree AV block, otherwise within normal limits.  Recent Labs: 02/25/2016: ALT 29; BUN 9.7; Creatinine 0.8; HGB 13.7; Platelets 242; Potassium 4.1; Sodium 138   Lipid Panel     Component Value Date/Time   CHOL 143 01/30/2015 1428   TRIG 70.0 01/30/2015 1428   HDL 46.40 01/30/2015 1428   CHOLHDL 3 01/30/2015 1428   VLDL 14.0 01/30/2015 1428   LDLCALC 83 01/30/2015 1428      Wt Readings from Last 3 Encounters:  05/02/16 188 lb 6.4 oz (85.5 kg)  04/28/16 190 lb (86.2 kg)  04/03/16 189 lb (85.7 kg)     ASSESSMENT AND PLAN: Coronary artery disease, native vessel, with history of spontaneous coronary artery dissection treated with single vessel CABG (saphenous vein graft RCA). The patient is doing well from a cardiac perspective. She went through major surgery with bilateral mastectomy and breast reconstructions last year without any cardiac complications. Recommend that she try to stay on aspirin regularly. This might help long-term with graft patency of the saphenous vein graft RCA. I reviewed her most recent lipids from 2017 which demonstrated a cholesterol of 143, HDL 46, and LDL 83. Since the mechanism of acute coronary syndrome was spontaneous coronary artery dissection rather than atherosclerotic disease, there really is no indication to treat her cholesterol with pharmacotherapy at its current level. She is going to establish with a new PCP in a few weeks and I suspect she will have labs drawn around the time of that visit. I  would like to see her back in one year. I think she is doing very well.  Current medicines are reviewed with the patient today.  The patient does not have concerns regarding medicines.  Labs/ tests ordered today include:   Orders Placed This Encounter  Procedures  . EKG 12-Lead    Disposition:   FU one year  Signed, Sherren Mocha, MD  05/02/2016 1:21 PM    [facility] [address], [place], [place]  [address] Phone: [phone]; Fax: [phone]

## 2016-05-02 NOTE — Patient Instructions (Signed)

## 2016-05-07 ENCOUNTER — Ambulatory Visit (INDEPENDENT_AMBULATORY_CARE_PROVIDER_SITE_OTHER): Payer: BLUE CROSS/BLUE SHIELD | Admitting: Family Medicine

## 2016-05-07 ENCOUNTER — Encounter: Payer: Self-pay | Admitting: Family Medicine

## 2016-05-07 VITALS — BP 106/65 | HR 94 | Temp 97.8°F | Ht 68.0 in | Wt 186.8 lb

## 2016-05-07 DIAGNOSIS — E890 Postprocedural hypothyroidism: Secondary | ICD-10-CM | POA: Diagnosis not present

## 2016-05-07 DIAGNOSIS — Z0000 Encounter for general adult medical examination without abnormal findings: Secondary | ICD-10-CM

## 2016-05-07 DIAGNOSIS — E039 Hypothyroidism, unspecified: Secondary | ICD-10-CM | POA: Insufficient documentation

## 2016-05-07 LAB — CBC
HCT: 42.8 % (ref 36.0–46.0)
HEMOGLOBIN: 14.7 g/dL (ref 12.0–15.0)
MCHC: 34.3 g/dL (ref 30.0–36.0)
MCV: 87 fl (ref 78.0–100.0)
PLATELETS: 290 10*3/uL (ref 150.0–400.0)
RBC: 4.92 Mil/uL (ref 3.87–5.11)
RDW: 12.9 % (ref 11.5–15.5)
WBC: 8.6 10*3/uL (ref 4.0–10.5)

## 2016-05-07 LAB — LIPID PANEL
Cholesterol: 132 mg/dL (ref 0–200)
HDL: 46.5 mg/dL (ref >39.00)
LDL Cholesterol: 67 mg/dL (ref 0–99)
NonHDL: 85.98
TRIGLYCERIDES: 96 mg/dL (ref 0.0–149.0)
Total CHOL/HDL Ratio: 3
VLDL: 19.2 mg/dL (ref 0.0–40.0)

## 2016-05-07 LAB — COMPREHENSIVE METABOLIC PANEL
ALBUMIN: 4.3 g/dL (ref 3.5–5.2)
ALK PHOS: 82 U/L (ref 39–117)
ALT: 21 U/L (ref 0–35)
AST: 19 U/L (ref 0–37)
BILIRUBIN TOTAL: 1.7 mg/dL — AB (ref 0.2–1.2)
BUN: 12 mg/dL (ref 6–23)
CALCIUM: 10.1 mg/dL (ref 8.4–10.5)
CO2: 28 meq/L (ref 19–32)
CREATININE: 0.95 mg/dL (ref 0.40–1.20)
Chloride: 105 mEq/L (ref 96–112)
GFR: 66.91 mL/min (ref >60.00)
Glucose, Bld: 91 mg/dL (ref 70–99)
Potassium: 4.3 mEq/L (ref 3.5–5.1)
Sodium: 140 mEq/L (ref 135–145)
Total Protein: 6.9 g/dL (ref 6.0–8.3)

## 2016-05-07 LAB — TSH: TSH: 0.77 u[IU]/mL (ref 0.35–4.50)

## 2016-05-07 NOTE — Progress Notes (Signed)
Pre visit review using our clinic review tool, if applicable. No additional management support is needed unless otherwise documented below in the visit note. 

## 2016-05-07 NOTE — Patient Instructions (Addendum)
Give [place] 2-3 business days to get the results of your labs back.   If you need [place] to do the injection in your hip bursa, we are able to do that.

## 2016-05-07 NOTE — Progress Notes (Signed)
Chief Complaint  Patient presents with  . Annual Exam    No conncerns noted.     Well Woman [name] is here for a complete physical.   Her last physical was >1 year ago.  Current diet: in general, a "healthy" diet  . Current exercise- walking/running, lifting weights. Weight is stable and she denies daytime fatigue. No LMP recorded. Patient is not currently having periods (Reason: IUD). Seatbelt? Yes   Health Maintenance Pap/HPV- Yes - follows with GYN Mammogram- Known BC of R, follows with Dr. Jana Hakim of [facility] Tetanus- Yes 2015 HIV- No  Past Medical History:  Diagnosis Date  . Arthritis   . Breast cancer [facility]) 12/2014   Seeing [facility] doctors  . CAD (coronary artery disease)    open heart surgery 09/12/10  . Cancer [facility]) 2007   thyroid  . GERD (gastroesophageal reflux disease)   . Heart disease   . History of thyroid cancer   . Hypothyroid   . Overweight(278.02)     Past Surgical History:  Procedure Laterality Date  . arm surgery     rt  . coronary artery bypass grafting x1  09/13/10   [place] Gum  . HERNIA REPAIR  1977   inguinal, ?side  . KNEE ARTHROSCOPY Right 09/13/2015   pt reported  . THYROIDECTOMY    . ULNAR NERVE REPAIR Right 1989, 2002   Medications  Current Outpatient Prescriptions on File Prior to Visit  Medication Sig Dispense Refill  . aspirin 81 MG tablet Take 81 mg by mouth every morning. Reported on 02/21/2015    . diphenhydrAMINE (BENADRYL) 25 mg capsule Take 25 mg by mouth at bedtime.     Marland Kitchen levonorgestrel (MIRENA) 20 MCG/24HR IUD 1 each by Intrauterine route once.      Marland Kitchen levothyroxine (SYNTHROID, LEVOTHROID) 150 MCG tablet Take 1 tablet (150 mcg total) by mouth every morning. 30 tablet 11   Allergies No Known Allergies  Review of Systems: Constitutional:  no unexpected change in weight, no weakness, no unexplained fevers, sweats, or chills Eye:  no recent significant change in vision Ear/Nose/Mouth/Throat:  Ears:  no tinnitus or  vertigo and no recent change in hearing, Nose/Mouth/Throat:  no complaints of nasal congestion or discharge, no sore throat and no recent change in voice or hoarseness Cardiovascular:  no exercise intolerance, no chest pain, no palpitations Respiratory:  no chronic cough, sputum, or hemoptysis and no shortness of breath Gastrointestinal:  no abdominal pain, no change in bowel habits, no significant change in appetite, no nausea, vomiting, diarrhea, or constipation and no black or bloody stool GU:  Female: negative for dysuria, frequency, and incontinence, No menses; no abnormal bleeding, pelvic pain, or discharge Musculoskeletal/Extremities: +R knee pain, +L hip pain,  no pain, redness, or swelling of the joints Integumentary (Skin/Breast):  no abnormal skin lesions reported, no new breast lumps or masses Neurologic:  no chronic headaches, no numbness, tingling, or tremor Psychiatric:  no anxiety, no depression Endocrine:  + fatigue, denies weight changes, heat/cold intolerance, bowel or skin changes, or cardiovascular system symptoms Hematologic/Lymphatic:  no abnormal bleeding, no HIV risk factors, no night sweats, no swollen nodes, no weight loss Allergic/Immunologic:  no history of food or environmental allergies  Exam BP 106/65 (BP Location: Left Arm, Patient Position: Sitting, Cuff Size: Normal)   Pulse 94   Temp 97.8 F (36.6 C) (Oral)   Ht 5\' 8"  (1.727 m)   Wt 186 lb 12.8 oz (84.7 kg)   SpO2 97% Comment:  RA  BMI 28.40 kg/m  General:  well developed, well nourished, in no apparent distress Skin:  no significant moles, warts, or growths Head:  no masses, lesions, or tenderness Eyes:  pupils equal and round, sclera anicteric without injection Ears:  canals without lesions, TMs shiny without retraction, no obvious effusion, no erythema Nose:  nares patent, septum midline, mucosa normal, and no drainage or sinus tenderness Throat/Pharynx:  lips and gingiva without lesion; tongue and  uvula midline; non-inflamed pharynx; no exudates or postnasal drainage Neck: neck supple without adenopathy, thyromegaly, or masses Breasts: not examined Thorax:  nontender Lungs:  clear to auscultation, breath sounds equal bilaterally, no respiratory distress Cardio:  regular rate and rhythm without murmurs, heart sounds without clicks or rubs, point of maximal impulse normal; no lifts, heaves, or thrills Abdomen:  abdomen soft, nontender; bowel sounds normal; no masses or organomegaly Genital: Not done (follows with GYN) Musculoskeletal:  symmetrical muscle groups noted without atrophy or deformity Extremities:  no clubbing, cyanosis, or edema, no deformities, no skin discoloration Neuro:  gait normal; deep tendon reflexes normal and symmetric Psych: well oriented with normal range of affect and appropriate judgment/insight  Assessment and Plan  Well adult exam - Plan: CBC, Comprehensive metabolic panel, Lipid panel  Postoperative hypothyroidism - Plan: TSH   Well 46 y.o. female. Counseled on diet and exercise. We are able to do hip bursa injections here if more convenient. Other orders as above. Follow up in 1 year pending above. The patient voiced understanding and agreement to the plan.  [place], DO 05/07/16 9:21 AM

## 2016-05-19 DIAGNOSIS — L570 Actinic keratosis: Secondary | ICD-10-CM | POA: Diagnosis not present

## 2016-05-19 DIAGNOSIS — L821 Other seborrheic keratosis: Secondary | ICD-10-CM | POA: Diagnosis not present

## 2016-05-19 DIAGNOSIS — L814 Other melanin hyperpigmentation: Secondary | ICD-10-CM | POA: Diagnosis not present

## 2016-05-19 DIAGNOSIS — D225 Melanocytic nevi of trunk: Secondary | ICD-10-CM | POA: Diagnosis not present

## 2016-05-19 DIAGNOSIS — D1800 Hemangioma unspecified site: Secondary | ICD-10-CM | POA: Diagnosis not present

## 2016-08-04 ENCOUNTER — Ambulatory Visit (HOSPITAL_BASED_OUTPATIENT_CLINIC_OR_DEPARTMENT_OTHER)
Admission: RE | Admit: 2016-08-04 | Discharge: 2016-08-04 | Disposition: A | Discharge status: Home / Self Care | Payer: BLUE CROSS/BLUE SHIELD | Source: Ambulatory Visit | Attending: Family | Admitting: Family

## 2016-08-04 ENCOUNTER — Telehealth: Payer: Self-pay | Admitting: Family

## 2016-08-04 ENCOUNTER — Ambulatory Visit (INDEPENDENT_AMBULATORY_CARE_PROVIDER_SITE_OTHER): Payer: Self-pay | Admitting: Family

## 2016-08-04 VITALS — BP 131/86 | HR 80 | Temp 98.6°F | Resp 18 | Wt 196.0 lb

## 2016-08-04 DIAGNOSIS — R05 Cough: Secondary | ICD-10-CM

## 2016-08-04 DIAGNOSIS — J40 Bronchitis, not specified as acute or chronic: Secondary | ICD-10-CM

## 2016-08-04 DIAGNOSIS — R059 Cough, unspecified: Secondary | ICD-10-CM

## 2016-08-04 MED ORDER — AZITHROMYCIN 250 MG PO TABS: ORAL_TABLET | ORAL | 0 refills | Status: DC

## 2016-08-04 MED ORDER — BENZONATATE 100 MG PO CAPS: 100.0000 mg | ORAL_CAPSULE | Freq: Three times a day (TID) | ORAL | 0 refills | Status: DC | PRN

## 2016-08-04 NOTE — Progress Notes (Signed)
Subjective:    Patient ID: [name], female    DOB: 1970-10-30, 46 y.o.   MRN: [id number]  HPI  [name] is a 46 yr old female who presents today with chief complaint of cough. Cough has been present for 2 weeks. She reports the cough is productive of clear mucus. She does report some stress incontinence due to cough. Denies associated fever. Feels OK otherwise.  No wheezing.  Tried mucinex with minimal improvement.    Review of Systems See HPI  Past Medical History:  Diagnosis Date  . Arthritis   . Breast cancer [facility]) 12/2014   Seeing [facility] doctors  . CAD (coronary artery disease)    open heart surgery 09/12/10  . Cancer [facility]) 2007   thyroid  . GERD (gastroesophageal reflux disease)   . Heart disease   . History of thyroid cancer   . Hypothyroid   . Overweight(278.02)      Social History   Social History  . Marital status: Married    Spouse name: N/A  . Number of children: 2  . Years of education: 16   Occupational History  . IT [employer]   Social History Main Topics  . Smoking status: Never Smoker  . Smokeless tobacco: Never Used  . Alcohol use Yes     Comment: rarely  . Drug use: No  . Sexual activity: Yes    Birth control/ protection: IUD   Other Topics Concern  . Not on file   Social History Narrative   Regular exercise-yes   Caffeine Use-yes   Works as Patent examiner   [phone]- son   Married   Enjoys Marine scientist and a Scientist, research (physical sciences) degree    Past Surgical History:  Procedure Laterality Date  . arm surgery     rt  . coronary artery bypass grafting x1  09/13/10   [place] Gum  . HERNIA REPAIR  1977   inguinal, ?side  . KNEE ARTHROSCOPY Right 09/13/2015   pt reported  . THYROIDECTOMY    . ULNAR NERVE REPAIR Right 1989, 2002    Family History  Problem Relation Age of Onset  . Hypertension Mother   . Colon polyps Mother   . Arthritis Mother   . Cancer Mother        breast    . Hypertension Father   . Hiatal hernia Father   . Hypertension Sister   . Lupus Maternal Aunt   . Heart disease Maternal Aunt   . Diabetes Maternal Aunt   . Arthritis Paternal Grandmother   . Diabetes Paternal Grandmother   . Parkinson's disease Paternal Grandmother   . Coronary artery disease Maternal Grandfather   . Cancer Maternal Aunt        breast, ovarian  . Diabetes Maternal Aunt   . Other Maternal Aunt        ?auto immune disease  . Colon cancer Neg Hx     No Known Allergies  Current Outpatient Prescriptions on File Prior to Visit  Medication Sig Dispense Refill  . aspirin 81 MG tablet Take 81 mg by mouth every morning. Reported on 02/21/2015    . diphenhydrAMINE (BENADRYL) 25 mg capsule Take 25 mg by mouth at bedtime.     Marland Kitchen levonorgestrel (MIRENA) 20 MCG/24HR IUD 1 each by Intrauterine route once.      Marland Kitchen levothyroxine (SYNTHROID, LEVOTHROID) 150 MCG tablet Take 1 tablet (150 mcg total)  by mouth every morning. 30 tablet 11   No current facility-administered medications on file prior to visit.     BP 131/86 (BP Location: Left Arm, Patient Position: Sitting, Cuff Size: Normal)   Pulse 80   Temp 98.6 F (37 C) (Oral)   Resp 18   Wt 196 lb (88.9 kg)   SpO2 99%   BMI 29.80 kg/m       Objective:   Physical Exam  Constitutional: She is oriented to person, place, and time. She appears well-developed and well-nourished.  HENT:  Head: Normocephalic and atraumatic.  Right Ear: Tympanic membrane and ear canal normal.  Left Ear: Tympanic membrane and ear canal normal.  Mouth/Throat: No oropharyngeal exudate, posterior oropharyngeal edema or posterior oropharyngeal erythema.  Cardiovascular: Normal rate, regular rhythm and normal heart sounds.   No murmur heard. Pulmonary/Chest: Effort normal and breath sounds normal. No respiratory distress. She has no wheezes.  Coughing frequently throughout exam  Neurological: She is alert and oriented to person, place, and time.   Psychiatric: She has a normal mood and affect. Her behavior is normal. Judgment and thought content normal.          Assessment & Plan:  Bronchitis- Chest x-ray is performed today and is negative for pneumonia. Given severity of her cough and duration, we'll plan to treat with Z-Pak for bronchitis. I've also given a prescription for Tessalon for cough as needed. She is advised to call if new or worsening symptoms or symptoms are not improved in the next few days.

## 2016-08-04 NOTE — Patient Instructions (Signed)
Please complete chest x ray on the first floor.  

## 2016-08-04 NOTE — Telephone Encounter (Signed)
Reviewed chest x ray.  CXR neg for pneumonia. Will rx with zpak to cover for bronchitis. Pt advised of results and rx and instructed to call if symptoms worsen or fail to improve.

## 2016-08-22 DIAGNOSIS — Z853 Personal history of malignant neoplasm of breast: Secondary | ICD-10-CM | POA: Diagnosis not present

## 2016-08-22 DIAGNOSIS — Z9013 Acquired absence of bilateral breasts and nipples: Secondary | ICD-10-CM | POA: Diagnosis not present

## 2016-08-22 DIAGNOSIS — Z803 Family history of malignant neoplasm of breast: Secondary | ICD-10-CM | POA: Diagnosis not present

## 2016-08-22 DIAGNOSIS — Z1231 Encounter for screening mammogram for malignant neoplasm of breast: Secondary | ICD-10-CM | POA: Diagnosis not present

## 2016-09-05 ENCOUNTER — Other Ambulatory Visit: Payer: Self-pay | Admitting: Endocrinology

## 2016-09-08 ENCOUNTER — Telehealth: Payer: Self-pay | Admitting: Endocrinology

## 2016-09-08 ENCOUNTER — Other Ambulatory Visit: Payer: Self-pay | Admitting: Endocrinology

## 2016-09-08 MED ORDER — LEVOTHYROXINE SODIUM 150 MCG PO TABS: 150.0000 ug | ORAL_TABLET | Freq: Every morning | ORAL | 1 refills | Status: DC

## 2016-09-08 NOTE — Telephone Encounter (Signed)
Submitted rx 

## 2016-09-08 NOTE — Telephone Encounter (Signed)
Patient called [facility] in [place] told her to call for refill on levothyroxine that she needs appoitnment with Dr. Loanne Drilling first. Patient states that she was told by Dr. Loanne Drilling that she was on a 2-3 year follow up schedule. Does she still need appointment first? Please call.

## 2016-09-08 NOTE — Telephone Encounter (Signed)
Ok, please refill x 6 months.

## 2016-09-28 ENCOUNTER — Encounter: Payer: Self-pay | Admitting: Family Medicine

## 2016-09-29 MED ORDER — OMEPRAZOLE 20 MG PO CPDR: 20.0000 mg | DELAYED_RELEASE_CAPSULE | Freq: Two times a day (BID) | ORAL | 1 refills | Status: DC

## 2016-09-29 NOTE — Telephone Encounter (Signed)
Omeprazole called in. 2 mo worth. Pt notified via [place].

## 2016-10-21 ENCOUNTER — Other Ambulatory Visit (HOSPITAL_BASED_OUTPATIENT_CLINIC_OR_DEPARTMENT_OTHER): Payer: BLUE CROSS/BLUE SHIELD

## 2016-10-21 DIAGNOSIS — C50311 Malignant neoplasm of lower-inner quadrant of right female breast: Secondary | ICD-10-CM

## 2016-10-21 LAB — COMPREHENSIVE METABOLIC PANEL
ALT: 28 U/L (ref 0–55)
AST: 28 U/L (ref 5–34)
Albumin: 3.7 g/dL (ref 3.5–5.0)
Alkaline Phosphatase: 100 U/L (ref 40–150)
Anion Gap: 6 mEq/L (ref 3–11)
BUN: 10.1 mg/dL (ref 7.0–26.0)
CHLORIDE: 109 meq/L (ref 98–109)
CO2: 24 meq/L (ref 22–29)
Calcium: 9.1 mg/dL (ref 8.4–10.4)
Creatinine: 1 mg/dL (ref 0.6–1.1)
EGFR: 71 mL/min/{1.73_m2} — AB (ref >90)
GLUCOSE: 91 mg/dL (ref 70–140)
POTASSIUM: 4.2 meq/L (ref 3.5–5.1)
SODIUM: 139 meq/L (ref 136–145)
TOTAL PROTEIN: 6.6 g/dL (ref 6.4–8.3)
Total Bilirubin: 1.18 mg/dL (ref 0.20–1.20)

## 2016-10-21 LAB — CBC WITH DIFFERENTIAL/PLATELET
BASO%: 0.4 % (ref 0.0–2.0)
Basophils Absolute: 0 10*3/uL (ref 0.0–0.1)
EOS ABS: 0 10*3/uL (ref 0.0–0.5)
EOS%: 0.1 % (ref 0.0–7.0)
HCT: 39.9 % (ref 34.8–46.6)
HGB: 13.6 g/dL (ref 11.6–15.9)
LYMPH%: 15.1 % (ref 14.0–49.7)
MCH: 29.6 pg (ref 25.1–34.0)
MCHC: 34.1 g/dL (ref 31.5–36.0)
MCV: 86.7 fL (ref 79.5–101.0)
MONO#: 0.6 10*3/uL (ref 0.1–0.9)
MONO%: 6.8 % (ref 0.0–14.0)
NEUT%: 77.6 % — ABNORMAL HIGH (ref 38.4–76.8)
NEUTROS ABS: 7 10*3/uL — AB (ref 1.5–6.5)
Platelets: 209 10*3/uL (ref 145–400)
RBC: 4.6 10*6/uL (ref 3.70–5.45)
RDW: 12.4 % (ref 11.2–14.5)
WBC: 9.1 10*3/uL (ref 3.9–10.3)
lymph#: 1.4 10*3/uL (ref 0.9–3.3)

## 2016-10-24 NOTE — Progress Notes (Signed)
[place]  [phone] [phone]) [phone]     ID: [name] DOB: [birth year]  MR#: [id number]  TOI#:[id number]  Patient Care Team: Debbrah Alar, NP as PCP - General (Internal Medicine) Kathyrn Lass, MD (Family Medicine) Rolm Bookbinder, MD as Consulting Physician (General Surgery) Irene Limbo, MD as Consulting Physician (Plastic Surgery) Sherren Mocha, MD as Consulting Physician (Cardiology) [place], Jennefer Bravo, MD as Referring Physician (Dermatology) Jacinto Reap, MD (Plastic Surgery) Jola Schmidt, MD as Consulting Physician (Ophthalmology) PCP: Debbrah Alar, NP  OTHER MD:  CHIEF COMPLAINT: Right-sided ductal carcinoma in situ, status post mastectomy  CURRENT TREATMENT: Screening for melanoma and pancreatic cancer in the setting of CDKN2A mutation   BREAST CANCER HISTORY: From the prior intake note:  [place] had bilateral screening mammography at a mobile clinic 11/16/2014. This showed a focal density in the lower inner quadrant of the right breast and she was recalled for diagnostic right mammography and right breast ultrasonography at the [facility] 11/28/2014. This showed the breast density to be category B. In the lower inner right breast there was a group of amorphous calcifications spanning approximately 3 cm. These were new as compared to mammography from 2014. Ultrasound of the area showed an ill-defined region of hypoechoic tissue but no definite mass.  Biopsy of the right breast mass in question 12/05/2014 showed ([id number]) ductal carcinoma in situ, grade 1, arising within an intraductal papilloma. The cells were estrogen receptor 9200% positive, progesterone receptor 80-90% positive, both with strong staining intensity.  The patient's subsequent history is as detailed below.  INTERVAL HISTORY: [name] returns today for follow-up of her high-risk genetic mutation. She is generally  doing well, and continues to see her dermatologist on a twice a year basis. She also checked with her optometrist, Dr. Wynetta Emery, who is experienced at retinal exams. She gets a retinal exam once a year. The patient also gets a breast exam through her surgeon at [facility], and also through her gynecologist.  REVIEW OF SYSTEMS: [name] reports she had a headache for the last 7 days, which has subsided. She also states she has gained about 20 pounds since May 2018. She is still staying active, but not to the extent that she used be prior to a trip to [place] in May. Her eating habits are not the worst, but she notes they could be better. She denies unusual visual changes, nausea, vomiting, or dizziness. There has been no unusual cough, phlegm production, or pleurisy. This been no change in bowel or bladder habits. She denies unexplained fatigue or unexplained weight loss, bleeding, rash, or fever. A detailed review of systems was otherwise entirely negative.   PAST MEDICAL HISTORY: Past Medical History:  Diagnosis Date  . Arthritis   . Breast cancer [facility]) 12/2014   Seeing [facility] doctors  . CAD (coronary artery disease)    open heart surgery 09/12/10  . Cancer [facility]) 2007   thyroid  . GERD (gastroesophageal reflux disease)   . Heart disease   . History of thyroid cancer   . Hypothyroid   . Overweight(278.02)     PAST SURGICAL HISTORY: Past Surgical History:  Procedure Laterality Date  . arm surgery     rt  . coronary artery bypass grafting x1  09/13/10   [place]  . HERNIA REPAIR  1977   inguinal, ?side  . KNEE ARTHROSCOPY Right 09/13/2015   pt reported  . THYROIDECTOMY    . ULNAR NERVE REPAIR Right 1989, 2002  FAMILY HISTORY Family History  Problem Relation Age of Onset  . Hypertension Mother   . Colon polyps Mother   . Arthritis Mother   . Cancer Mother        breast  . Hypertension Father   . Hiatal hernia Father   . Hypertension Sister   . Lupus Maternal Aunt   . Heart  disease Maternal Aunt   . Diabetes Maternal Aunt   . Arthritis Paternal Grandmother   . Diabetes Paternal Grandmother   . Parkinson's disease Paternal Grandmother   . Coronary artery disease Maternal Grandfather   . Cancer Maternal Aunt        breast, ovarian  . Diabetes Maternal Aunt   . Other Maternal Aunt        ?auto immune disease  . Colon cancer Neg Hx    The patient's parents are in their late 65s as of December 2016. The patient's mother was diagnosed with breast cancer age 51; on the maternal side, the maternal grandmother was diagnosed with uterine cancer at age 81. 2 maternal aunts had one breast cancer and one uterine cancer and one maternal uncle had colon cancer is were in their 23s and 30s. On the father's side there is an uncle with colon cancer age 19. There is no history of ovarian cancer to the patient's knowledge   GYNECOLOGIC HISTORY:  No LMP recorded. Patient is not currently having periods (Reason: IUD). Menarche age 52, first live birth age 29. The patient is GX P2. She still having regular periods. She has a Mirena IUD in place.  SOCIAL HISTORY: (Updated 10/28/2016) [name] works as a Dealer. Her husband [name] is an Brewing technologist. Their son, [name], graduated from [employer] in [place] [employer] and is currently living at home. Their daughter, [name] is a Museum/gallery exhibitions officer at [employer].    ADVANCED DIRECTIVES: Not in place   HEALTH MAINTENANCE: Social History  Substance Use Topics  . Smoking status: Never Smoker  . Smokeless tobacco: Never Used  . Alcohol use Yes     Comment: rarely     Colonoscopy:  PAP: 2013  Bone density:  Lipid panel:  No Known Allergies  Current Outpatient Prescriptions  Medication Sig Dispense Refill  . aspirin 81 MG tablet Take 81 mg by mouth every morning. Reported on 02/21/2015    . azithromycin (ZITHROMAX Z-PAK) 250 MG tablet 2 tabs by mouth today, then one tablet by mouth daily for 4 more days. 6 tablet  0  . benzonatate (TESSALON) 100 MG capsule Take 1 capsule (100 mg total) by mouth 3 (three) times daily as needed. 30 capsule 0  . diphenhydrAMINE (BENADRYL) 25 mg capsule Take 25 mg by mouth at bedtime.     Marland Kitchen levonorgestrel (MIRENA) 20 MCG/24HR IUD 1 each by Intrauterine route once.      Marland Kitchen levothyroxine (SYNTHROID, LEVOTHROID) 150 MCG tablet Take 1 tablet (150 mcg total) by mouth every morning. 30 tablet 11  . levothyroxine (SYNTHROID, LEVOTHROID) 150 MCG tablet Take 1 tablet (150 mcg total) by mouth every morning. 90 tablet 1  . omeprazole (PRILOSEC) 20 MG capsule Take 1 capsule (20 mg total) by mouth 2 (two) times daily before a meal. 60 capsule 1   No current facility-administered medications for this visit.     OBJECTIVE: Middle-aged white woman Who appears well Vitals:   10/28/16 1304  BP: 139/78  Pulse: 74  Resp: 18  Temp: 98 F (36.7 C)  SpO2: 99%  Body mass index is 31 kg/m.    ECOG FS:0 - Asymptomatic  Sclerae unicteric, EOMs intact Oropharynx clear and moist No cervical or supraclavicular adenopathy Lungs no rales or rhonchi Heart regular rate and rhythm Abd soft, nontender, positive bowel sounds, no masses palpated MSK no focal spinal tenderness, no upper extremity lymphedema Neuro: nonfocal, well oriented, appropriate affect Breasts: She is status post bilateral mastectomies with DIEP reconstruction. There are no abnormal findings and no evidence of disease recurrence. Both axillae are benign.  LAB RESULTS:  CMP     Component Value Date/Time   NA 139 10/21/2016 0846   K 4.2 10/21/2016 0846   CL 105 05/07/2016 0842   CO2 24 10/21/2016 0846   GLUCOSE 91 10/21/2016 0846   BUN 10.1 10/21/2016 0846   CREATININE 1.0 10/21/2016 0846   CALCIUM 9.1 10/21/2016 0846   PROT 6.6 10/21/2016 0846   ALBUMIN 3.7 10/21/2016 0846   AST 28 10/21/2016 0846   ALT 28 10/21/2016 0846   ALKPHOS 100 10/21/2016 0846   BILITOT 1.18 10/21/2016 0846   GFRNONAA 82 (L) 02/12/2014  1740   GFRAA >90 02/12/2014 1740    INo results found for: SPEP, UPEP  Lab Results  Component Value Date   WBC 9.1 10/21/2016   NEUTROABS 7.0 (H) 10/21/2016   HGB 13.6 10/21/2016   HCT 39.9 10/21/2016   MCV 86.7 10/21/2016   PLT 209 10/21/2016      Chemistry      Component Value Date/Time   NA 139 10/21/2016 0846   K 4.2 10/21/2016 0846   CL 105 05/07/2016 0842   CO2 24 10/21/2016 0846   BUN 10.1 10/21/2016 0846   CREATININE 1.0 10/21/2016 0846      Component Value Date/Time   CALCIUM 9.1 10/21/2016 0846   ALKPHOS 100 10/21/2016 0846   AST 28 10/21/2016 0846   ALT 28 10/21/2016 0846   BILITOT 1.18 10/21/2016 0846       No results found for: LABCA2  No components found for: LABCA125  No results for input(s): INR in the last 168 hours.  Urinalysis    Component Value Date/Time   COLORURINE DARK YELLOW 02/20/2015 0705   APPEARANCEUR TURBID (A) 02/20/2015 0705   LABSPEC 1.023 02/20/2015 0705   PHURINE 5.5 02/20/2015 0705   GLUCOSEU NEGATIVE 02/20/2015 0705   GLUCOSEU NEGATIVE 01/30/2015 1428   HGBUR NEGATIVE 02/20/2015 0705   BILIRUBINUR NEGATIVE 02/20/2015 0705   KETONESUR NEGATIVE 02/20/2015 0705   PROTEINUR NEGATIVE 02/20/2015 0705   UROBILINOGEN 0.2 01/30/2015 1428   NITRITE NEGATIVE 02/20/2015 0705   LEUKOCYTESUR NEGATIVE 02/20/2015 0705    STUDIES: MRI/MRCP from October 2017 reviewed  ASSESSMENT: 46 y.o. 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  The patient does not carry theCHEK2  Deletion present in her mother.  (a)  Patients who carry a  CDKN2A mutation are at high risk of melanoma  (28-76% lifetime risk) and pancreatic cancer (17% lifetime risk).  (2)   Status post bilateral mastectomies with immediate DIEP reconstruction 02/24//2017 showing  (a)  On the left side, benign breast tissue  (b)  On the right side, pTis pN0, stage 0 DCIS  measuring, 2.8 cm, estrogen receptor and progesterone receptor positive,   (3)  Screening for melanoma with by  annual skin exam is recommended ([place])   (4) screening for pancreatic cancer will consist of by annual MRI,  with a baseline ERCP  (a) abdominal MRI 04/12/2017shows only cholelithiasis and mild hepatomegaly  (b) abdominal MRI/MRCP 10/17/2015 showed normal pancreas, mild hepatomegaly, and cholelithiasis  (5) Mirena IUD in place   PLAN:  [name] will soon be 2 years out from definitive surgery for her noninvasive breast cancer, which is almost certainly cured.  We continue to see her for screening for her high risk mutation. She sees her dermatologist twice a year and will see her optometrist once a year for a thorough retinal exam. If she sees her gynecologist in February she can see me again in October of next year and that means she would have a breast exam every 6 months.  The pancreatic cancer of course is a wild card and we are doing a repeat MRI/MRCP in January 2019. That will be repeated December 2019  She knows to call for any problems that may develop before her next visit here.   [name], Gina Dad, MD  10/28/16 1:22 PM Medical Oncology and Hematology [facility] [address] [place], [place] [address] Tel. [phone]    Fax. [phone]  This document serves as a record of services personally performed by Chauncey Cruel, MD. It was created on her behalf by Margit Banda, a trained medical scribe. The creation of this record is based on the scribe's personal observations and the provider's statements to them. This document has been checked and approved by the attending provider.

## 2016-10-28 ENCOUNTER — Ambulatory Visit (HOSPITAL_BASED_OUTPATIENT_CLINIC_OR_DEPARTMENT_OTHER): Payer: BLUE CROSS/BLUE SHIELD | Admitting: Oncology

## 2016-10-28 VITALS — BP 139/78 | HR 74 | Temp 98.0°F | Resp 18 | Ht 68.0 in | Wt 203.9 lb

## 2016-10-28 DIAGNOSIS — D0511 Intraductal carcinoma in situ of right breast: Secondary | ICD-10-CM

## 2016-10-28 DIAGNOSIS — Z85850 Personal history of malignant neoplasm of thyroid: Secondary | ICD-10-CM | POA: Diagnosis not present

## 2016-10-28 DIAGNOSIS — C73 Malignant neoplasm of thyroid gland: Secondary | ICD-10-CM

## 2016-11-17 DIAGNOSIS — L814 Other melanin hyperpigmentation: Secondary | ICD-10-CM | POA: Diagnosis not present

## 2016-11-17 DIAGNOSIS — D485 Neoplasm of uncertain behavior of skin: Secondary | ICD-10-CM | POA: Diagnosis not present

## 2016-11-17 DIAGNOSIS — L821 Other seborrheic keratosis: Secondary | ICD-10-CM | POA: Diagnosis not present

## 2016-11-17 DIAGNOSIS — D1800 Hemangioma unspecified site: Secondary | ICD-10-CM | POA: Diagnosis not present

## 2016-11-17 DIAGNOSIS — D225 Melanocytic nevi of trunk: Secondary | ICD-10-CM | POA: Diagnosis not present

## 2016-11-17 DIAGNOSIS — B078 Other viral warts: Secondary | ICD-10-CM | POA: Diagnosis not present

## 2016-11-22 ENCOUNTER — Other Ambulatory Visit: Payer: Self-pay | Admitting: Family Medicine

## 2016-11-28 ENCOUNTER — Telehealth: Payer: Self-pay | Admitting: Family

## 2016-11-28 NOTE — Telephone Encounter (Signed)
Copied from [place] [phone]. Topic: General - Other >> Nov 28, 2016  3:08 PM Oneta Rack wrote: [facility] [place], [address]  Reason for call:  Pharmacy requesting 60 day supply omeprazole (PRILOSEC) 20 MG capsule

## 2016-11-28 NOTE — Telephone Encounter (Signed)
[name]-- pt was started on this by Dr Nani Ravens in September and told to take it for 2 months and stop. Pt did that and told us that she is taking it once a day now.  Please advise if ok to continue once daily?

## 2016-11-29 MED ORDER — OMEPRAZOLE 20 MG PO CPDR: 20.0000 mg | DELAYED_RELEASE_CAPSULE | Freq: Every day | ORAL | 5 refills | Status: DC

## 2016-11-29 NOTE — Telephone Encounter (Signed)
OK to continue once daily. Rx. Sent.

## 2016-12-01 NOTE — Telephone Encounter (Signed)
Mychart message sent to pt in previous email.

## 2016-12-17 DIAGNOSIS — B078 Other viral warts: Secondary | ICD-10-CM | POA: Diagnosis not present

## 2016-12-17 DIAGNOSIS — L309 Dermatitis, unspecified: Secondary | ICD-10-CM | POA: Diagnosis not present

## 2017-01-12 IMAGING — MR MR BREAST BILATERAL W WO CONTRAST
8 of 13 series · 30 of 48 positions shown · IV contrast (multihance)
Comparison: No prior MRI.

CLINICAL DATA: Recent biopsy proven DCIS in the lower inner
quadrant of the right breast, anterior depth, screening detected
calcifications which span approximately 3 cm. Preoperative
evaluation to exclude nipple involvement. Family history of breast
cancer in her mother at age 66 and a maternal aunt at age 60. She
also has a maternal aunt with ovarian cancer diagnosed at age 50.

LABS:  Not applicable.
EXAM:
BILATERAL BREAST MRI WITH AND WITHOUT CONTRAST
TECHNIQUE: Multiplanar, multisequence MR images of both breasts were obtained
prior to and following the intravenous administration of 17 ml of
MultiHance.

[Series 2: T2 · axial · 3.0mm · 0.94mm/px · z∈[-80,+82]mm · 3 of 55 slices shown]
[im 1/55]
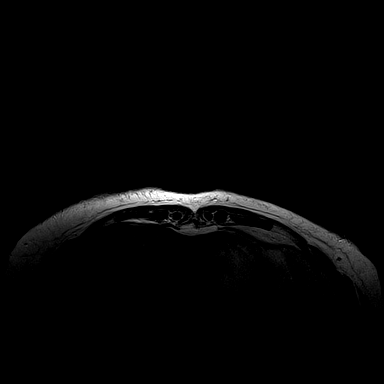
[im 28/55]
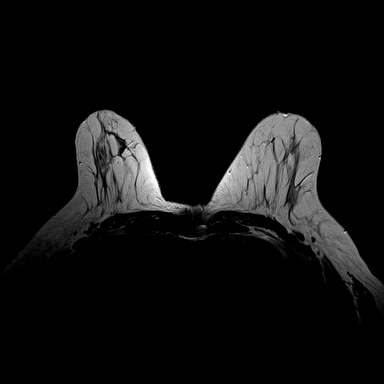
[im 55/55]
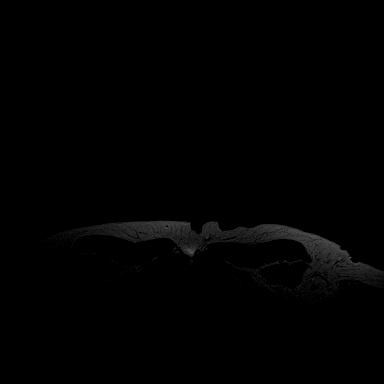

[Series 3: t2_tirm_tra ipat (a-p) · axial · 3.0mm · 0.70mm/px · z∈[-80,+82]mm · 2 of 55 slices shown]
[im 1/55]
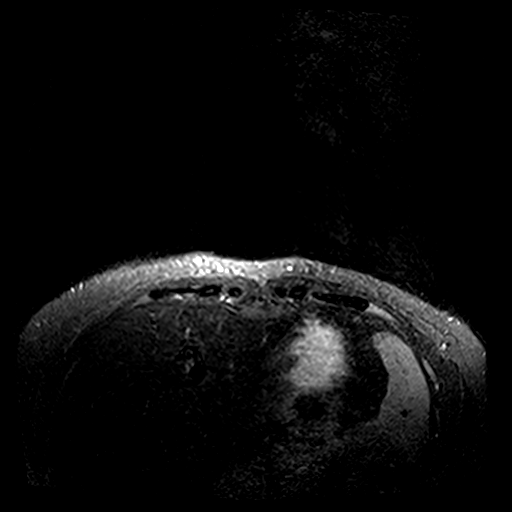
[im 55/55]
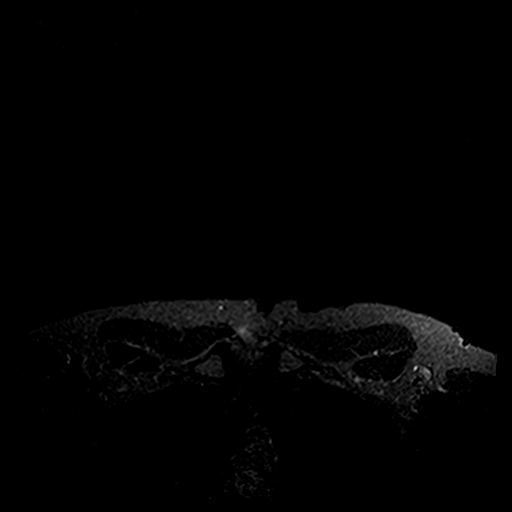

[Series 4: fl3d pre-cm no · axial · non-contrast · 1.2mm · 0.94mm/px · z∈[-85,+86]mm · 5 of 144 slices shown]
[im 1/144]
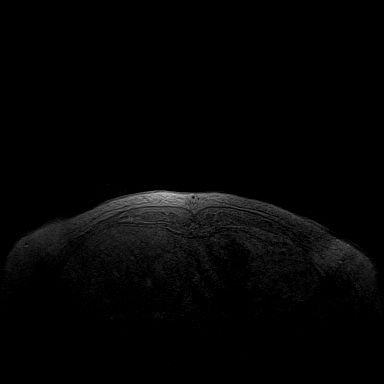
[im 36/144]
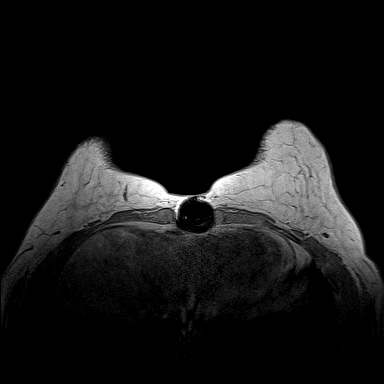
[im 72/144]
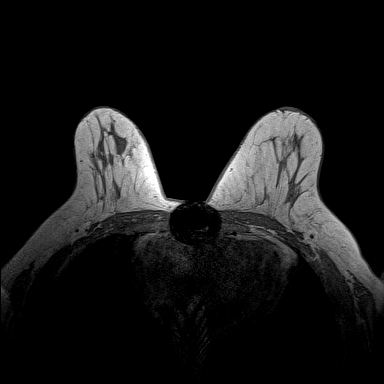
[im 108/144]
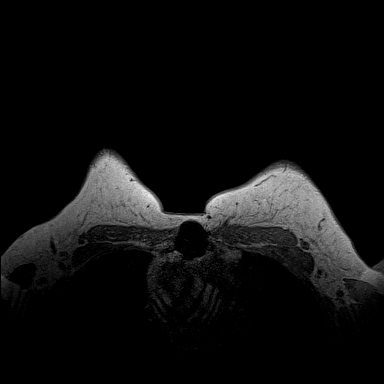
[im 144/144]
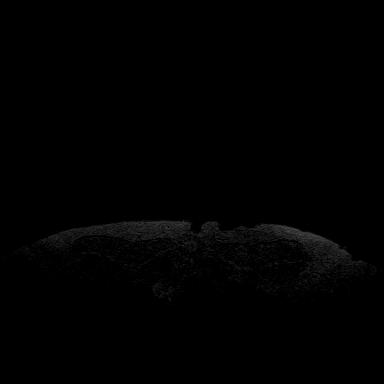

[Series 5: fl3d pre-cm · axial · non-contrast · 1.2mm · 0.94mm/px · z∈[-85,+86]mm · 5 of 144 slices shown]
[im 1/144]
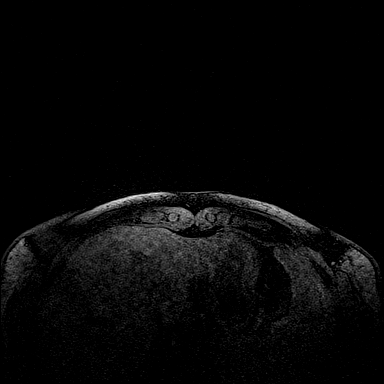
[im 36/144]
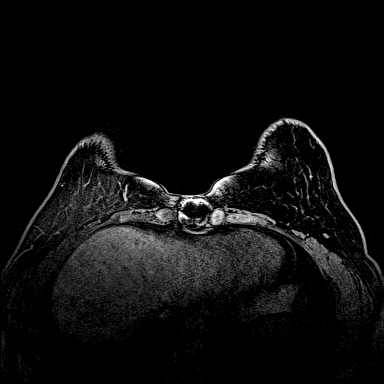
[im 72/144]
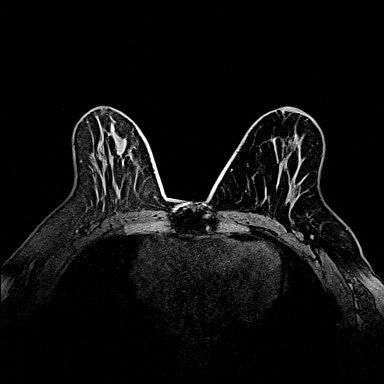
[im 108/144]
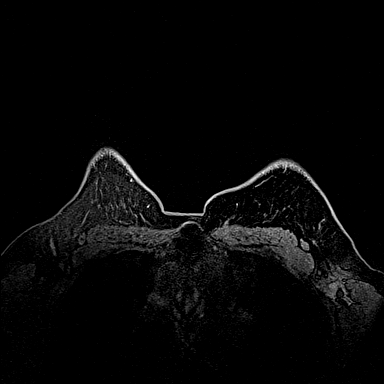
[im 144/144]
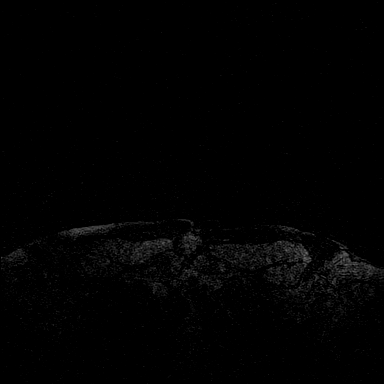

[Series 6: fl3d post-cm 20 · axial · 1.2mm · 0.94mm/px · z∈[-85,+86]mm · 5 of 144 slices shown (1 of 3)]
[im 1/144]
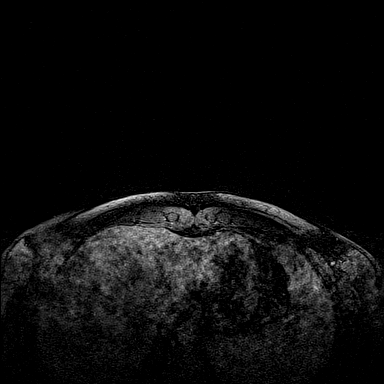
[im 36/144]
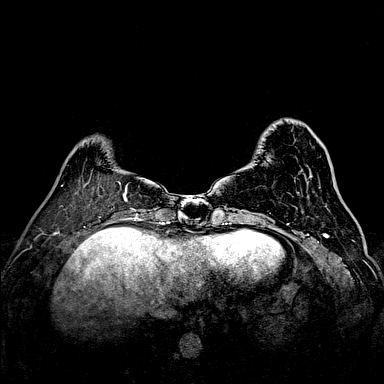
[im 72/144]
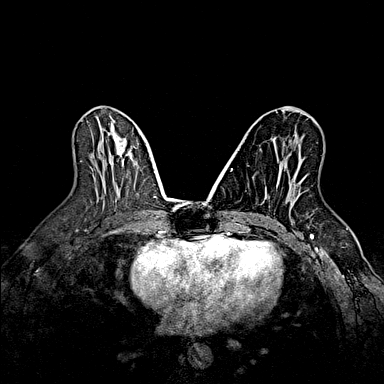
[im 108/144]
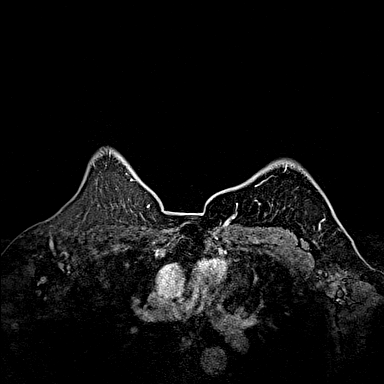
[im 144/144]
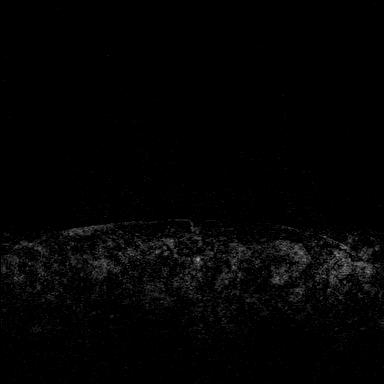

[Series 7: fl3d post-cm 20 · axial · 1.2mm · 0.94mm/px · z∈[-85,+86]mm · 5 of 144 slices shown (2 of 3)]
[im 1/144]
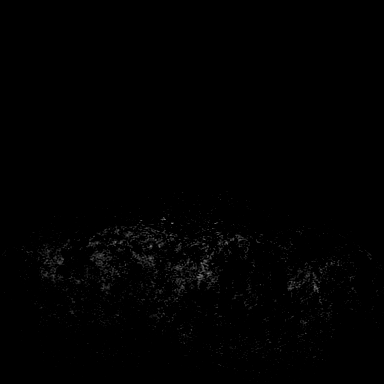
[im 36/144]
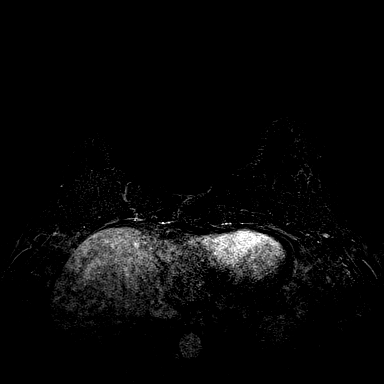
[im 72/144]
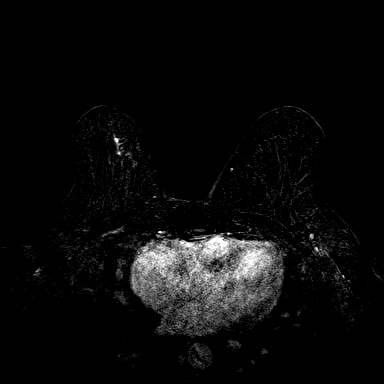
[im 108/144]
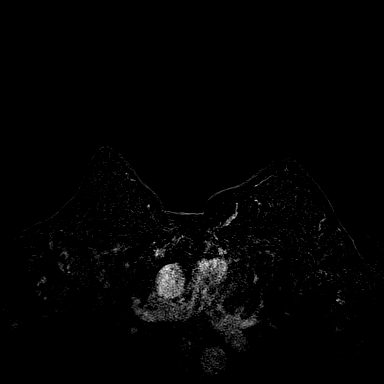
[im 144/144]
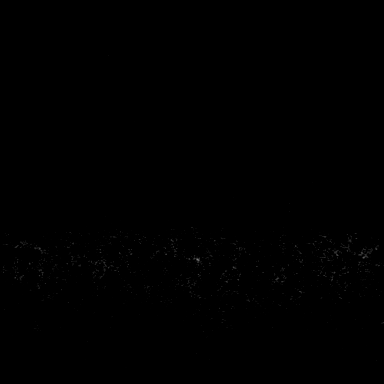

[Series 8: fl3d post-cm 20 · axial · 172.8mm · 0.94mm/px · 1 of 1 slices shown (3 of 3)]
[im 1/1]
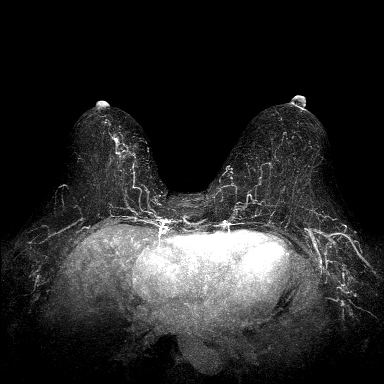

[Series 9: fl3d post-cm 3min · axial · 1.2mm · 0.94mm/px · z∈[-85,+43]mm · 4 of 144 slices shown]
[im 1/144]
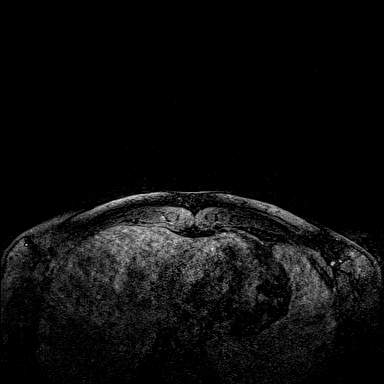
[im 36/144]
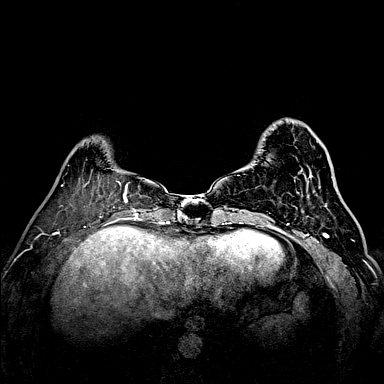
[im 72/144]
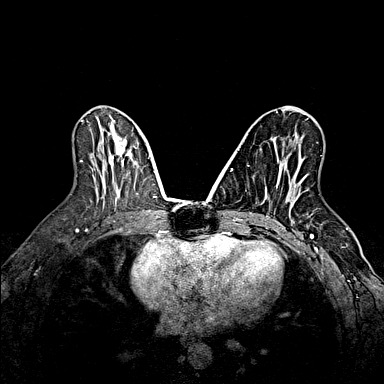
[im 108/144]
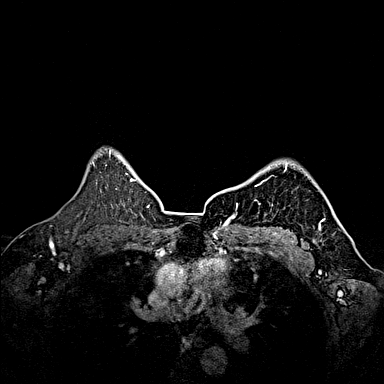

[30 of 48 positions shown; findings below may reference images not displayed]

THREE-DIMENSIONAL MR IMAGE RENDERING ON INDEPENDENT WORKSTATION:

Three-dimensional MR images were rendered by post-processing of the
original MR data on an independent workstation. The
three-dimensional MR images were interpreted, and findings are
reported in the following complete MRI report for this study. Three
dimensional images were evaluated at the independent [employer]
workstation.
Mammography 12/05/2014, 11/28/2014 and
11/15/2014 from [REDACTED] are correlated.
FINDINGS: Breast composition: b. Scattered fibroglandular tissue.

Background parenchymal enhancement: Mild.

Right breast: Non mass enhancement involving the lower inner
quadrant beginning approximately 1.3 cm behind the nipple without
evidence of nipple involvement. Overall, the enhancement measures
approximately 4.9 x 1.9 x 1.3 cm, demonstrating plateau kinetics;
this is a larger area when compared to the calcifications on the
mammogram, the enhancement extending approximately 1.5 cm posterior
to the biopsy clip. No enhancing masses.

Left breast: No mass or abnormal enhancement.

Lymph nodes: No pathologic lymphadenopathy.

Ancillary findings: Blooming artifact related to the patient's
sternal wires from the prior CABG. No significant ancillary
findings.
IMPRESSION: 1. Non mass enhancement involving the lower inner quadrant of the
right breast consistent with the biopsy-proven DCIS. The enhancement
spans approximately 4.9 cm, a larger area when compared to the
calcifications on the mammogram, extending approximately 1.5 cm
posterior to the biopsy clip.
2. No MR evidence of malignancy elsewhere in either breast.

RECOMMENDATION:
Treatment plan.

Bracketed needle localization would be recommended if breast sparing
surgery is contemplated, and MR guidance for localization is
suggested. Alternatively, the posterior edge of the enhancement
could be biopsied under MRI guidance, and the clip placed at the
time of biopsy could be used for the posterior extent of the
bracketed localization under mammographic guidance.

BI-RADS CATEGORY  6: Known biopsy-proven malignancy.

## 2017-01-15 DIAGNOSIS — B078 Other viral warts: Secondary | ICD-10-CM | POA: Diagnosis not present

## 2017-01-16 ENCOUNTER — Ambulatory Visit (HOSPITAL_COMMUNITY)
Admission: RE | Admit: 2017-01-16 | Discharge: 2017-01-16 | Disposition: A | Discharge status: Home / Self Care | Payer: BLUE CROSS/BLUE SHIELD | Source: Ambulatory Visit | Attending: Oncology | Admitting: Oncology

## 2017-01-16 ENCOUNTER — Other Ambulatory Visit: Payer: Self-pay | Admitting: Oncology

## 2017-01-16 DIAGNOSIS — K8020 Calculus of gallbladder without cholecystitis without obstruction: Secondary | ICD-10-CM | POA: Diagnosis not present

## 2017-01-16 DIAGNOSIS — C73 Malignant neoplasm of thyroid gland: Secondary | ICD-10-CM | POA: Insufficient documentation

## 2017-01-16 DIAGNOSIS — D0511 Intraductal carcinoma in situ of right breast: Secondary | ICD-10-CM

## 2017-01-16 MED ORDER — GADOBENATE DIMEGLUMINE 529 MG/ML IV SOLN
20.0000 mL | Freq: Once | INTRAVENOUS | Status: AC | PRN
  Administered 2017-01-16: 19 mL via INTRAVENOUS

## 2017-01-17 ENCOUNTER — Encounter: Payer: Self-pay | Admitting: Oncology

## 2017-02-10 MED ORDER — GADOBENATE DIMEGLUMINE 529 MG/ML IV SOLN
20.0000 mL | Freq: Once | INTRAVENOUS | Status: AC | PRN
  Administered 2017-01-16: 19 mL via INTRAVENOUS

## 2017-02-10 NOTE — Addendum Note (Signed)
Encounter addended by: Rush Barer on: 02/10/2017 10:15 AM  Actions taken: Imaging Exam ended, Order list changed, MAR administration accepted

## 2017-02-11 ENCOUNTER — Ambulatory Visit: Payer: BLUE CROSS/BLUE SHIELD | Admitting: Family Medicine

## 2017-02-11 ENCOUNTER — Encounter: Payer: Self-pay | Admitting: Family Medicine

## 2017-02-11 VITALS — BP 120/90 | HR 86 | Temp 97.6°F | Ht 68.0 in | Wt 204.2 lb

## 2017-02-11 DIAGNOSIS — M79641 Pain in right hand: Secondary | ICD-10-CM

## 2017-02-11 DIAGNOSIS — M79642 Pain in left hand: Secondary | ICD-10-CM

## 2017-02-11 NOTE — Progress Notes (Signed)
Musculoskeletal Exam  Patient: [name] DOB: Nov 26, 1970  DOS: 02/11/2017  SUBJECTIVE:  Chief Complaint:  Chief Complaint  Patient presents with  . Hand Pain    [name] is a 47 y.o.  female for evaluation and treatment of b/l hand pain.   Onset:  3 months ago. No injury or change in activity.  Location: 1st mcp b/l, 2nd mcp on R Constant pain Palliation: Nothing Provocation: palpations, using it Is not affected by weather changes like her knee is Character:  aching  Progression of issue:  is unchanged Associated symptoms: No swelling, bruising, redness Treatment: to date has been none.   Neurovascular symptoms: no +RA and SLE in cousins and aunts  ROS: Musculoskeletal/Extremities: +b/l hand pain Neuro: No numbness/tingling  Past Medical History:  Diagnosis Date  . Arthritis   . Breast cancer [facility]) 12/2014   Seeing [facility] doctors  . CAD (coronary artery disease)    open heart surgery 09/12/10  . Cancer [facility]) 2007   thyroid  . GERD (gastroesophageal reflux disease)   . Heart disease   . History of thyroid cancer   . Hypothyroid   . Overweight(278.02)     Objective: VITAL SIGNS: BP 120/90 (BP Location: Left Arm, Patient Position: Sitting, Cuff Size: Large)   Pulse 86   Temp 97.6 F (36.4 C) (Oral)   Ht 5\' 8"  (1.727 m)   Wt 204 lb 4 oz (92.6 kg)   SpO2 97%   BMI 31.06 kg/m  Constitutional: Well formed, well developed. No acute distress. Cardiovascular: Brisk cap refill Thorax & Lungs: No accessory muscle use Musculoskeletal: hands.   Normal active range of motion: yes.   Normal passive range of motion: yes Tenderness to palpation: over R lateral 2nd MCP and lateral thenar eminence; no ttp over L hand Deformity: no Ecchymosis: no Tests positive: none Tests negative: Finkelstein's, Tinel's Neurologic: Normal sensory function.  Psychiatric: Normal mood. Age appropriate judgment and insight. Alert & oriented x 3.     Assessment:  Bilateral hand pain - Plan: Vitamin D (25 hydroxy), ANA W/Rfx to all if Positive, Rheumatoid Factor, Cyclic citrul peptide antibody, IgG, Sedimentation rate, Uric acid, CANCELED: Sedimentation rate  Plan: Orders as above. Ck AI etiologies given location of pain and famhx. Less likely gout, but will check. Ice, NSAID, Tylenol prn. Consider XR vs pred taper in future if no help. F/u pending above. The patient voiced understanding and agreement to the plan.   [place], DO 02/11/17  3:51 PM

## 2017-02-11 NOTE — Patient Instructions (Signed)
Ice/cold pack over area for 10-15 min every 2-3 hours while awake.  OK to take Tylenol 1000 mg (2 extra strength tabs) or 975 mg (3 regular strength tabs) every 6 hours as needed.  Ibuprofen 400-600 mg (2-3 over the counter strength tabs) every 6 hours as needed for pain.  Let us know if you need anything.

## 2017-02-11 NOTE — Progress Notes (Signed)
Pre visit review using our clinic review tool, if applicable. No additional management support is needed unless otherwise documented below in the visit note. 

## 2017-02-12 LAB — SEDIMENTATION RATE: SED RATE: 9 mm/h (ref 0–20)

## 2017-02-12 LAB — CYCLIC CITRUL PEPTIDE ANTIBODY, IGG

## 2017-02-12 LAB — ANA W/RFX TO ALL IF POSITIVE: ANA: NEGATIVE

## 2017-02-12 LAB — VITAMIN D 25 HYDROXY (VIT D DEFICIENCY, FRACTURES): VITD: 26.71 ng/mL — ABNORMAL LOW (ref 30.00–100.00)

## 2017-02-12 LAB — RHEUMATOID FACTOR

## 2017-02-12 LAB — URIC ACID: Uric Acid, Serum: 5.1 mg/dL (ref 2.4–7.0)

## 2017-02-13 ENCOUNTER — Telehealth: Payer: Self-pay | Admitting: *Deleted

## 2017-02-13 NOTE — Telephone Encounter (Signed)
Received Lab Report results from [facility]; forwarded to provider/SLS 02/01

## 2017-02-24 ENCOUNTER — Other Ambulatory Visit: Payer: Self-pay | Admitting: Endocrinology

## 2017-02-25 ENCOUNTER — Other Ambulatory Visit: Payer: Self-pay

## 2017-02-25 NOTE — Telephone Encounter (Signed)
Patient hasn't been seen since 2017 ok to refill?

## 2017-02-26 NOTE — Telephone Encounter (Signed)
Please refill x 1 Ov is due  

## 2017-02-27 ENCOUNTER — Other Ambulatory Visit: Payer: Self-pay

## 2017-02-27 MED ORDER — LEVOTHYROXINE SODIUM 150 MCG PO TABS: 150.0000 ug | ORAL_TABLET | Freq: Every morning | ORAL | 0 refills | Status: DC

## 2017-02-27 NOTE — Telephone Encounter (Signed)
Done

## 2017-05-07 ENCOUNTER — Ambulatory Visit: Payer: BLUE CROSS/BLUE SHIELD | Admitting: Family Medicine

## 2017-05-07 ENCOUNTER — Encounter: Payer: Self-pay | Admitting: Family Medicine

## 2017-05-07 VITALS — BP 118/84 | HR 79 | Temp 98.2°F | Ht 68.0 in | Wt 207.2 lb

## 2017-05-07 DIAGNOSIS — M7062 Trochanteric bursitis, left hip: Secondary | ICD-10-CM

## 2017-05-07 MED ORDER — METHYLPREDNISOLONE ACETATE 40 MG/ML IJ SUSP
40.0000 mg | Freq: Once | INTRAMUSCULAR | Status: AC
  Administered 2017-05-07: 40 mg via INTRAMUSCULAR

## 2017-05-07 NOTE — Patient Instructions (Signed)
Ice/cold pack over area for 10-15 min twice daily.  OK to take Tylenol 1000 mg (2 extra strength tabs) or 975 mg (3 regular strength tabs) every 6 hours as needed.  Ibuprofen 400-600 mg (2-3 over the counter strength tabs) every 6 hours as needed for pain.  7-10 days for the steroid to start working. Take it easy.   Let us know if you need anything.

## 2017-05-07 NOTE — Progress Notes (Signed)
Chief Complaint  Patient presents with  . Hip Pain    left   Here for injection in L hip.   BP 118/84 (BP Location: Left Arm, Patient Position: Sitting, Cuff Size: Normal)   Pulse 79   Temp 98.2 F (36.8 C) (Oral)   Ht 5\' 8"  (1.727 m)   Wt 207 lb 4 oz (94 kg)   SpO2 97%   BMI 31.51 kg/m  Gen- awake MSK- TTP over L greater troch bursa Psych- age appropriate judgment and insight  Procedure note: Greater trochanteric bursa injection Verbal consent obtained. The area of interest was palpated and demarcated with an otoscope speculum. It was cleaned with an alcohol swab. Freeze spray was used. A 27 g needle was inserted at a perpendicular angle through the area of interested. The plunger was withdrawn to ensure our placement was not in a vessel. 2 mL of 1% lidocaine without epi and 40 mg of Depo was injected. A bandaid was placed. The patient tolerated the procedure well.  There were no complications noted.   Greater trochanteric bursitis of left hip - Plan: PR DRAIN/INJECT LARGE JOINT/BURSA, methylPREDNISolone acetate (DEPO-MEDROL) injection 40 mg  Injection as above.  Ice, anti-inflammatories, Tylenol. Follow-up as needed.   Patient voiced understanding and agreement to the plan.  [name] 3:02 PM 05/07/17

## 2017-05-07 NOTE — Progress Notes (Signed)
Pre visit review using our clinic review tool, if applicable. No additional management support is needed unless otherwise documented below in the visit note. 

## 2017-06-05 ENCOUNTER — Other Ambulatory Visit: Payer: Self-pay | Admitting: Endocrinology

## 2017-06-10 DIAGNOSIS — L821 Other seborrheic keratosis: Secondary | ICD-10-CM | POA: Diagnosis not present

## 2017-06-10 DIAGNOSIS — D1800 Hemangioma unspecified site: Secondary | ICD-10-CM | POA: Diagnosis not present

## 2017-06-10 DIAGNOSIS — L814 Other melanin hyperpigmentation: Secondary | ICD-10-CM | POA: Diagnosis not present

## 2017-06-10 DIAGNOSIS — D225 Melanocytic nevi of trunk: Secondary | ICD-10-CM | POA: Diagnosis not present

## 2017-06-30 DIAGNOSIS — M25552 Pain in left hip: Secondary | ICD-10-CM | POA: Diagnosis not present

## 2017-06-30 DIAGNOSIS — M7062 Trochanteric bursitis, left hip: Secondary | ICD-10-CM | POA: Diagnosis not present

## 2017-07-08 DIAGNOSIS — M7062 Trochanteric bursitis, left hip: Secondary | ICD-10-CM | POA: Diagnosis not present

## 2017-07-13 DIAGNOSIS — M7062 Trochanteric bursitis, left hip: Secondary | ICD-10-CM | POA: Diagnosis not present

## 2017-07-14 DIAGNOSIS — M7062 Trochanteric bursitis, left hip: Secondary | ICD-10-CM | POA: Diagnosis not present

## 2017-07-20 DIAGNOSIS — M7062 Trochanteric bursitis, left hip: Secondary | ICD-10-CM | POA: Diagnosis not present

## 2017-07-23 DIAGNOSIS — M7062 Trochanteric bursitis, left hip: Secondary | ICD-10-CM | POA: Diagnosis not present

## 2017-07-27 DIAGNOSIS — M7062 Trochanteric bursitis, left hip: Secondary | ICD-10-CM | POA: Diagnosis not present

## 2017-07-29 ENCOUNTER — Encounter: Payer: Self-pay | Admitting: Cardiovascular Disease

## 2017-07-29 DIAGNOSIS — M7062 Trochanteric bursitis, left hip: Secondary | ICD-10-CM | POA: Diagnosis not present

## 2017-08-03 DIAGNOSIS — M7062 Trochanteric bursitis, left hip: Secondary | ICD-10-CM | POA: Diagnosis not present

## 2017-08-05 ENCOUNTER — Ambulatory Visit: Payer: BLUE CROSS/BLUE SHIELD | Admitting: Cardiovascular Disease

## 2017-08-05 DIAGNOSIS — M7062 Trochanteric bursitis, left hip: Secondary | ICD-10-CM | POA: Diagnosis not present

## 2017-08-10 DIAGNOSIS — M7062 Trochanteric bursitis, left hip: Secondary | ICD-10-CM | POA: Diagnosis not present

## 2017-08-12 ENCOUNTER — Encounter: Payer: Self-pay | Admitting: Family Medicine

## 2017-08-18 DIAGNOSIS — M79642 Pain in left hand: Secondary | ICD-10-CM | POA: Diagnosis not present

## 2017-09-09 ENCOUNTER — Ambulatory Visit: Payer: BLUE CROSS/BLUE SHIELD | Admitting: Family Medicine

## 2017-09-09 ENCOUNTER — Encounter: Payer: Self-pay | Admitting: Family Medicine

## 2017-09-09 VITALS — BP 118/80 | HR 73 | Temp 98.0°F | Ht 68.0 in | Wt 202.5 lb

## 2017-09-09 DIAGNOSIS — K219 Gastro-esophageal reflux disease without esophagitis: Secondary | ICD-10-CM

## 2017-09-09 DIAGNOSIS — E890 Postprocedural hypothyroidism: Secondary | ICD-10-CM

## 2017-09-09 LAB — TSH: TSH: 0.47 u[IU]/mL (ref 0.35–4.50)

## 2017-09-09 MED ORDER — OMEPRAZOLE 10 MG PO CPDR: 10.0000 mg | DELAYED_RELEASE_CAPSULE | Freq: Two times a day (BID) | ORAL | 1 refills | Status: DC

## 2017-09-09 MED ORDER — LEVOTHYROXINE SODIUM 150 MCG PO TABS: 150.0000 ug | ORAL_TABLET | Freq: Every morning | ORAL | 3 refills | Status: DC

## 2017-09-09 NOTE — Progress Notes (Signed)
Chief Complaint  Patient presents with  . Follow-up    Subjective: Patient is a 47 y.o. female here for thyroid f/u.  Currently taking levothyroxine 150 mcg/d. On replacement s/p thyroidectomy. Doing well, reports compliance. No thyroid s/s's.   Doing well with omeprazole 20 mg/d, would like to try 10 mg bid or qd to see if it is helpful. Will get burning in upper chest/neck area if she does not take. This has gotten worse since she lost weight.  ROS: Endo: No unexplained wt change  Past Medical History:  Diagnosis Date  . Arthritis   . Breast cancer [facility]) 12/2014   Seeing [facility] doctors  . CAD (coronary artery disease)    open heart surgery 09/12/10  . Cancer [facility]) 2007   thyroid  . GERD (gastroesophageal reflux disease)   . Heart disease   . History of thyroid cancer   . Hypothyroid   . Overweight(278.02)     Objective: BP 118/80 (BP Location: Left Arm, Patient Position: Sitting, Cuff Size: Normal)   Pulse 73   Temp 98 F (36.7 C) (Oral)   Ht 5\' 8"  (1.727 m)   Wt 202 lb 8 oz (91.9 kg)   SpO2 97%   BMI 30.79 kg/m  General: Awake, appears stated age Neck: Supple, no masses or nodules noted, symmetric HEENT: MMM, EOMi Heart: RRR, no murmurs Lungs: CTAB, no rales, wheezes or rhonchi. No accessory muscle use Psych: Age appropriate judgment and insight, normal affect and mood  Assessment and Plan: Postoperative hypothyroidism - Plan: TSH, levothyroxine (SYNTHROID, LEVOTHROID) 150 MCG tablet  Gastroesophageal reflux disease, esophagitis presence not specified - Plan: omeprazole (PRILOSEC) 10 MG capsule  Orders as above. The patient voiced understanding and agreement to the plan.  [place], DO 09/09/17  8:38 AM

## 2017-09-09 NOTE — Progress Notes (Signed)
Pre visit review using our clinic review tool, if applicable. No additional management support is needed unless otherwise documented below in the visit note. 

## 2017-09-09 NOTE — Patient Instructions (Signed)
Go back on Flonase.  Give [place] 2-3 business days to get the results of your labs back.   Let us know if you need anything.

## 2017-09-18 ENCOUNTER — Telehealth: Payer: Self-pay

## 2017-09-18 NOTE — Telephone Encounter (Signed)
Called to rescheduled 11/14 OV (it is currently scheduled during meeting).   Left message to call back.

## 2017-09-23 NOTE — Telephone Encounter (Signed)
See MyChart messages.

## 2017-09-23 NOTE — Telephone Encounter (Signed)
Follow up    Pt is calling fustrated and wanting to speak with a nurse about her appt being cancelled for the second time and why she is unable to contact through my chart. Please call

## 2017-10-01 ENCOUNTER — Other Ambulatory Visit: Payer: Self-pay | Admitting: *Deleted

## 2017-10-01 ENCOUNTER — Telehealth: Payer: Self-pay

## 2017-10-01 DIAGNOSIS — D0511 Intraductal carcinoma in situ of right breast: Secondary | ICD-10-CM

## 2017-10-01 DIAGNOSIS — Q999 Chromosomal abnormality, unspecified: Secondary | ICD-10-CM

## 2017-10-01 DIAGNOSIS — C73 Malignant neoplasm of thyroid gland: Secondary | ICD-10-CM

## 2017-10-01 DIAGNOSIS — Z1509 Genetic susceptibility to other malignant neoplasm: Secondary | ICD-10-CM

## 2017-10-01 DIAGNOSIS — R7989 Other specified abnormal findings of blood chemistry: Secondary | ICD-10-CM

## 2017-10-01 DIAGNOSIS — R945 Abnormal results of liver function studies: Secondary | ICD-10-CM

## 2017-10-01 NOTE — Telephone Encounter (Signed)
Per 9/19 patient requested a appointment. Scheduled and transferred to RN pod for further message to be left with RN for [place].

## 2017-10-02 ENCOUNTER — Telehealth: Payer: Self-pay | Admitting: Oncology

## 2017-10-02 NOTE — Telephone Encounter (Signed)
Spoke with patient regarding lab appt date/time per 9/19 sch msg

## 2017-11-16 ENCOUNTER — Encounter: Payer: Self-pay | Admitting: Cardiovascular Disease

## 2017-11-16 ENCOUNTER — Ambulatory Visit: Payer: BLUE CROSS/BLUE SHIELD | Admitting: Cardiovascular Disease

## 2017-11-16 VITALS — BP 124/78 | HR 86 | Ht 68.0 in | Wt 206.8 lb

## 2017-11-16 DIAGNOSIS — I2510 Atherosclerotic heart disease of native coronary artery without angina pectoris: Secondary | ICD-10-CM

## 2017-11-16 DIAGNOSIS — E782 Mixed hyperlipidemia: Secondary | ICD-10-CM

## 2017-11-16 NOTE — Patient Instructions (Signed)
Medication Instructions:  Your provider recommends that you continue on your current medications as directed. Please refer to the Current Medication list given to you today.    Labwork: Please have your fasting labs drawn.  Testing/Procedures: None  Follow-Up: Your provider wants you to follow-up in: 6 months with Dr. Burt Knack. You will receive a reminder letter in the mail two months in advance. If you don't receive a letter, please call our office to schedule the follow-up appointment.    Any Other Special Instructions Will Be Listed Below (If Applicable).     If you need a refill on your cardiac medications before your next appointment, please call your pharmacy.

## 2017-11-16 NOTE — Progress Notes (Signed)
Cardiology Office Note:    Date:  11/18/2017   ID:  [name], DOB [birth year], MRN [id number]  PCP:  Shelda Pal, DO  Cardiologist:  Sherren Mocha, MD  Electrophysiologist:  None   Referring MD: Shelda Pal*   Chief Complaint  Patient presents with  . Coronary Artery Disease    History of Present Illness:    [name] is a 46 y.o. female with a hx of spontaneous coronary artery dissection, returning for follow-up evaluation.  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 She underwent single-vessel CABG. Follow-up gated cardiac CTA demonstrated healing of the native RCA and continued patency of the saphenous vein graft RCA. LV function at follow-up has been normal with an ejection fraction of 55-60%. The patient was diagnosed with ductal carcinoma in situ of the right breast in 2016. In 2017 she underwent bilateral mastectomies and reconstructive surgeries. She did not have any cardiac complications related to surgery. She apparently has a genetic predisposition for melanoma and pancreatic cancer. She has been placed on a screening regimen.   The patient is here alone today.  She is been feeling well.  She just ran a 10K this past weekend and had no exertional symptoms.  States that she is "out of shape" and has not been exercising regularly.  She is gained some weight.  She denies chest pain, shortness of breath, leg swelling, heart palpitations, orthopnea, or PND.  Past Medical History:  Diagnosis Date  . Arthritis   . Breast cancer [facility]) 12/2014   Seeing [facility] doctors  . CAD (coronary artery disease)    open heart surgery 09/12/10  . Cancer [facility]) 2007   thyroid  . GERD (gastroesophageal reflux disease)   . Heart disease   . History of thyroid cancer   . Hypothyroid   . Overweight(278.02)     Past Surgical History:    Procedure Laterality Date  . arm surgery     rt  . coronary artery bypass grafting x1  09/13/10   [place] [facility]  . HERNIA REPAIR  1977   inguinal, ?side  . KNEE ARTHROSCOPY Right 09/13/2015   pt reported  . THYROIDECTOMY    . ULNAR NERVE REPAIR Right 1989, 2002    Current Medications: Current Meds  Medication Sig  . diphenhydrAMINE (BENADRYL) 25 mg capsule Take 25 mg by mouth at bedtime.   Marland Kitchen levonorgestrel (MIRENA) 20 MCG/24HR IUD 1 each by Intrauterine route once.    Marland Kitchen levothyroxine (SYNTHROID, LEVOTHROID) 150 MCG tablet Take 1 tablet (150 mcg total) by mouth every morning.  Marland Kitchen omeprazole (PRILOSEC) 10 MG capsule Take 1 capsule (10 mg total) by mouth 2 (two) times daily.     Allergies:   Patient has no known allergies.   Social History   Socioeconomic History  . Marital status: Married    Spouse name: Not on file  . Number of children: 2  . Years of education: 72  . Highest education level: Not on file  Occupational History  . Occupation: Building control surveyor: [employer]  Social Needs  . Financial resource strain: Not on file  . Food insecurity:    Worry: Not on file    Inability: Not on file  . Transportation needs:    Medical: Not on file    Non-medical: Not on file  Tobacco Use  . Smoking status:  Never Smoker  . Smokeless tobacco: Never Used  Substance and Sexual Activity  . Alcohol use: Yes    Comment: rarely  . Drug use: No  . Sexual activity: Yes    Birth control/protection: IUD  Lifestyle  . Physical activity:    Days per week: Not on file    Minutes per session: Not on file  . Stress: Not on file  Relationships  . Social connections:    Talks on phone: Not on file    Gets together: Not on file    Attends religious service: Not on file    Active member of club or organization: Not on file    Attends meetings of clubs or organizations: Not on file    Relationship status: Not on file  Other Topics Concern  . Not on file  Social History  Narrative   Regular exercise-yes   Caffeine Use-yes   Works as Patent examiner   [phone]- son   Married   Enjoys Marine scientist and a Scientist, research (physical sciences) degree     Family History: The patient's family history includes Arthritis in her mother and paternal grandmother; Cancer in her maternal aunt and mother; Colon polyps in her mother; Coronary artery disease in her maternal grandfather; Diabetes in her maternal aunt, maternal aunt, and paternal grandmother; Heart disease in her maternal aunt; Hiatal hernia in her father; Hypertension in her father, mother, and sister; Lupus in her maternal aunt; Other in her maternal aunt; Parkinson's disease in her paternal grandmother. There is no history of Colon cancer.  ROS:   Please see the history of present illness.    All other systems reviewed and are negative.  EKGs/Labs/Other Studies Reviewed:    EKG:  EKG is ordered today.  The ekg ordered today demonstrates normal sinus rhythm with first-degree AV block, otherwise within normal limits.  Recent Labs: 09/09/2017: TSH 0.47  Recent Lipid Panel    Component Value Date/Time   CHOL 132 05/07/2016 0842   TRIG 96.0 05/07/2016 0842   HDL 46.50 05/07/2016 0842   CHOLHDL 3 05/07/2016 0842   VLDL 19.2 05/07/2016 0842   LDLCALC 67 05/07/2016 0842    Physical Exam:    VS:  BP 124/78   Pulse 86   Ht 5\' 8"  (1.727 m)   Wt 206 lb 12.8 oz (93.8 kg)   SpO2 96%   BMI 31.44 kg/m     Wt Readings from Last 3 Encounters:  11/16/17 206 lb 12.8 oz (93.8 kg)  09/09/17 202 lb 8 oz (91.9 kg)  05/07/17 207 lb 4 oz (94 kg)     GEN:  Well nourished, well developed in no acute distress HEENT: Normal NECK: No JVD; No carotid bruits LYMPHATICS: No lymphadenopathy CARDIAC: RRR, no murmurs, rubs, gallops RESPIRATORY:  Clear to auscultation without rales, wheezing or rhonchi  ABDOMEN: Soft, non-tender, non-distended MUSCULOSKELETAL:  No edema; No deformity  SKIN: Warm and  dry NEUROLOGIC:  Alert and oriented x 3 PSYCHIATRIC:  Normal affect   ASSESSMENT:    1. Coronary artery disease involving native coronary artery of native heart without angina pectoris   2. Mixed hyperlipidemia    PLAN:    In order of problems listed above:  1. The patient is stable without any symptoms of angina.  She is now 7 years out from her acute coronary syndrome event which was caused by spontaneous coronary artery dissection.  I recommended that she follow-up in 1  year. 2. She has previously been treated with a statin drug, but has been off of this now for the past year.  Lifestyle modification is discussed.  We discussed specific strategies around diet and exercise.  I am going to update her lipids and LFTs.   Medication Adjustments/Labs and Tests Ordered: Current medicines are reviewed at length with the patient today.  Concerns regarding medicines are outlined above.  Orders Placed This Encounter  Procedures  . Lipid panel  . Hepatic function panel  . EKG 12-Lead   No orders of the defined types were placed in this encounter.   Patient Instructions  Medication Instructions:  Your provider recommends that you continue on your current medications as directed. Please refer to the Current Medication list given to you today.    Labwork: Please have your fasting labs drawn.  Testing/Procedures: None  Follow-Up: Your provider wants you to follow-up in: 6 months with Dr. Burt Knack. You will receive a reminder letter in the mail two months in advance. If you don't receive a letter, please call our office to schedule the follow-up appointment.    Any Other Special Instructions Will Be Listed Below (If Applicable).     If you need a refill on your cardiac medications before your next appointment, please call your pharmacy.      Signed, Sherren Mocha, MD  11/18/2017 2:52 PM    [place]

## 2017-11-25 DIAGNOSIS — L814 Other melanin hyperpigmentation: Secondary | ICD-10-CM | POA: Diagnosis not present

## 2017-11-25 DIAGNOSIS — L821 Other seborrheic keratosis: Secondary | ICD-10-CM | POA: Diagnosis not present

## 2017-11-25 DIAGNOSIS — D224 Melanocytic nevi of scalp and neck: Secondary | ICD-10-CM | POA: Diagnosis not present

## 2017-11-25 DIAGNOSIS — D225 Melanocytic nevi of trunk: Secondary | ICD-10-CM | POA: Diagnosis not present

## 2017-11-26 ENCOUNTER — Ambulatory Visit: Payer: BLUE CROSS/BLUE SHIELD | Admitting: Cardiovascular Disease

## 2017-12-04 ENCOUNTER — Telehealth: Payer: Self-pay | Admitting: Oncology

## 2017-12-04 ENCOUNTER — Other Ambulatory Visit: Payer: Self-pay | Admitting: *Deleted

## 2017-12-04 DIAGNOSIS — R7989 Other specified abnormal findings of blood chemistry: Secondary | ICD-10-CM

## 2017-12-04 DIAGNOSIS — I2510 Atherosclerotic heart disease of native coronary artery without angina pectoris: Secondary | ICD-10-CM

## 2017-12-04 DIAGNOSIS — D0511 Intraductal carcinoma in situ of right breast: Secondary | ICD-10-CM

## 2017-12-04 DIAGNOSIS — Z1501 Genetic susceptibility to malignant neoplasm of breast: Secondary | ICD-10-CM

## 2017-12-04 DIAGNOSIS — Z1509 Genetic susceptibility to other malignant neoplasm: Secondary | ICD-10-CM

## 2017-12-04 DIAGNOSIS — R945 Abnormal results of liver function studies: Secondary | ICD-10-CM

## 2017-12-04 NOTE — Telephone Encounter (Signed)
Patient called to reschedule and Val said it was ok any day next week

## 2017-12-07 ENCOUNTER — Other Ambulatory Visit: Payer: BLUE CROSS/BLUE SHIELD

## 2017-12-08 ENCOUNTER — Inpatient Hospital Stay: Payer: BLUE CROSS/BLUE SHIELD | Attending: Oncology

## 2017-12-08 ENCOUNTER — Other Ambulatory Visit: Payer: BLUE CROSS/BLUE SHIELD

## 2017-12-08 DIAGNOSIS — I2510 Atherosclerotic heart disease of native coronary artery without angina pectoris: Secondary | ICD-10-CM | POA: Diagnosis not present

## 2017-12-08 DIAGNOSIS — Z1501 Genetic susceptibility to malignant neoplasm of breast: Secondary | ICD-10-CM

## 2017-12-08 DIAGNOSIS — R945 Abnormal results of liver function studies: Secondary | ICD-10-CM

## 2017-12-08 DIAGNOSIS — D0511 Intraductal carcinoma in situ of right breast: Secondary | ICD-10-CM | POA: Insufficient documentation

## 2017-12-08 DIAGNOSIS — R7989 Other specified abnormal findings of blood chemistry: Secondary | ICD-10-CM

## 2017-12-08 DIAGNOSIS — Z1509 Genetic susceptibility to other malignant neoplasm: Secondary | ICD-10-CM

## 2017-12-08 LAB — CBC WITH DIFFERENTIAL (CANCER CENTER ONLY)
Abs Immature Granulocytes: 0.02 10*3/uL (ref 0.00–0.07)
BASOS PCT: 1 %
Basophils Absolute: 0.1 10*3/uL (ref 0.0–0.1)
EOS ABS: 0 10*3/uL (ref 0.0–0.5)
EOS PCT: 0 %
HEMATOCRIT: 40.4 % (ref 36.0–46.0)
Hemoglobin: 13.8 g/dL (ref 12.0–15.0)
IMMATURE GRANULOCYTES: 0 %
LYMPHS ABS: 1.3 10*3/uL (ref 0.7–4.0)
Lymphocytes Relative: 19 %
MCH: 28.9 pg (ref 26.0–34.0)
MCHC: 34.2 g/dL (ref 30.0–36.0)
MCV: 84.7 fL (ref 80.0–100.0)
MONO ABS: 0.5 10*3/uL (ref 0.1–1.0)
MONOS PCT: 7 %
NEUTROS PCT: 73 %
Neutro Abs: 5.1 10*3/uL (ref 1.7–7.7)
Platelet Count: 248 10*3/uL (ref 150–400)
RBC: 4.77 MIL/uL (ref 3.87–5.11)
RDW: 11.9 % (ref 11.5–15.5)
WBC Count: 7 10*3/uL (ref 4.0–10.5)
nRBC: 0 % (ref 0.0–0.2)

## 2017-12-08 LAB — CMP (CANCER CENTER ONLY)
ALT: 19 U/L (ref 0–44)
ANION GAP: 7 (ref 5–15)
AST: 23 U/L (ref 15–41)
Albumin: 3.9 g/dL (ref 3.5–5.0)
Alkaline Phosphatase: 90 U/L (ref 38–126)
BUN: 12 mg/dL (ref 6–20)
CHLORIDE: 107 mmol/L (ref 98–111)
CO2: 24 mmol/L (ref 22–32)
Calcium: 10 mg/dL (ref 8.9–10.3)
Creatinine: 0.89 mg/dL (ref 0.44–1.00)
Glucose, Bld: 90 mg/dL (ref 70–99)
POTASSIUM: 4 mmol/L (ref 3.5–5.1)
Sodium: 138 mmol/L (ref 135–145)
Total Bilirubin: 1.9 mg/dL — ABNORMAL HIGH (ref 0.3–1.2)
Total Protein: 6.8 g/dL (ref 6.5–8.1)

## 2017-12-08 LAB — LIPID PANEL
CHOL/HDL RATIO: 3.4 ratio
CHOLESTEROL: 151 mg/dL (ref 0–200)
HDL: 45 mg/dL (ref >40)
LDL Cholesterol: 95 mg/dL (ref 0–99)
TRIGLYCERIDES: 55 mg/dL (ref <150)
VLDL: 11 mg/dL (ref 0–40)

## 2017-12-09 ENCOUNTER — Telehealth: Payer: Self-pay | Admitting: *Deleted

## 2017-12-09 NOTE — Telephone Encounter (Signed)
Called pt re: lab results. Left a message for her to call back.

## 2017-12-09 NOTE — Telephone Encounter (Signed)
Pt returned my call and she has been made aware of her lab results. She has been made aware that Dr. Saunders Revel was going to defer starting a statin to Dr. Burt Knack, and when they decide, someone would give her a call back.  Pt verbalized understanding.

## 2017-12-10 ENCOUNTER — Other Ambulatory Visit: Payer: Self-pay | Admitting: Oncology

## 2017-12-10 DIAGNOSIS — R7989 Other specified abnormal findings of blood chemistry: Secondary | ICD-10-CM

## 2017-12-10 DIAGNOSIS — Z1509 Genetic susceptibility to other malignant neoplasm: Secondary | ICD-10-CM

## 2017-12-10 DIAGNOSIS — Q999 Chromosomal abnormality, unspecified: Secondary | ICD-10-CM

## 2017-12-10 DIAGNOSIS — D0511 Intraductal carcinoma in situ of right breast: Secondary | ICD-10-CM

## 2017-12-10 DIAGNOSIS — R945 Abnormal results of liver function studies: Secondary | ICD-10-CM

## 2017-12-10 DIAGNOSIS — C73 Malignant neoplasm of thyroid gland: Secondary | ICD-10-CM

## 2017-12-11 ENCOUNTER — Other Ambulatory Visit: Payer: BLUE CROSS/BLUE SHIELD

## 2017-12-13 ENCOUNTER — Other Ambulatory Visit: Payer: Self-pay | Admitting: Family

## 2017-12-13 DIAGNOSIS — K219 Gastro-esophageal reflux disease without esophagitis: Secondary | ICD-10-CM

## 2017-12-14 ENCOUNTER — Ambulatory Visit (HOSPITAL_COMMUNITY)
Admission: RE | Admit: 2017-12-14 | Discharge: 2017-12-14 | Disposition: A | Discharge status: Home / Self Care | Payer: BLUE CROSS/BLUE SHIELD | Source: Ambulatory Visit | Attending: Oncology | Admitting: Oncology

## 2017-12-14 DIAGNOSIS — R945 Abnormal results of liver function studies: Secondary | ICD-10-CM | POA: Insufficient documentation

## 2017-12-14 DIAGNOSIS — C73 Malignant neoplasm of thyroid gland: Secondary | ICD-10-CM

## 2017-12-14 DIAGNOSIS — Z1509 Genetic susceptibility to other malignant neoplasm: Secondary | ICD-10-CM | POA: Diagnosis not present

## 2017-12-14 DIAGNOSIS — D0511 Intraductal carcinoma in situ of right breast: Secondary | ICD-10-CM | POA: Insufficient documentation

## 2017-12-14 DIAGNOSIS — K8020 Calculus of gallbladder without cholecystitis without obstruction: Secondary | ICD-10-CM | POA: Insufficient documentation

## 2017-12-14 DIAGNOSIS — R7989 Other specified abnormal findings of blood chemistry: Secondary | ICD-10-CM

## 2017-12-14 DIAGNOSIS — R935 Abnormal findings on diagnostic imaging of other abdominal regions, including retroperitoneum: Secondary | ICD-10-CM | POA: Diagnosis not present

## 2017-12-14 DIAGNOSIS — Q999 Chromosomal abnormality, unspecified: Secondary | ICD-10-CM

## 2017-12-14 MED ORDER — GADOBUTROL 1 MMOL/ML IV SOLN
20.0000 mL | Freq: Once | INTRAVENOUS | Status: AC | PRN
  Administered 2017-12-14: 7.5 mL via INTRAVENOUS

## 2017-12-14 MED ORDER — OMEPRAZOLE 10 MG PO CPDR: 10.0000 mg | DELAYED_RELEASE_CAPSULE | Freq: Two times a day (BID) | ORAL | 1 refills | Status: DC

## 2017-12-16 DIAGNOSIS — M7062 Trochanteric bursitis, left hip: Secondary | ICD-10-CM | POA: Diagnosis not present

## 2017-12-16 DIAGNOSIS — M25552 Pain in left hip: Secondary | ICD-10-CM | POA: Diagnosis not present

## 2017-12-21 NOTE — Progress Notes (Signed)
[place]  [phone] [phone]     ID: [name] DOB: [birth year]  MR#: [id number]  MVH#:[id number]  Patient Care Team: Shelda Pal, DO as PCP - General (Family Medicine) Sherren Mocha, MD as PCP - Cardiology (Cardiology) Kathyrn Lass, MD (Family Medicine) Rolm Bookbinder, MD as Consulting Physician (General Surgery) Irene Limbo, MD as Consulting Physician (Plastic Surgery) Sherren Mocha, MD as Consulting Physician (Cardiology) [place], Jennefer Bravo, MD as Referring Physician (Dermatology) Jacinto Reap, MD (Plastic Surgery) Jola Schmidt, MD as Consulting Physician (Ophthalmology) OTHER MD:  CHIEF COMPLAINT: Right-sided ductal carcinoma in situ  CURRENT TREATMENT: Screening for melanoma and pancreatic cancer in the setting of CDKN2A mutation   BREAST CANCER HISTORY: From the prior intake note:  [name] had bilateral screening mammography at a mobile clinic 11/16/2014. This showed a focal density in the lower inner quadrant of the right breast and she was recalled for diagnostic right mammography and right breast ultrasonography at the [facility] 11/28/2014. This showed the breast density to be category B. In the lower inner right breast there was a group of amorphous calcifications spanning approximately 3 cm. These were new as compared to mammography from 2014. Ultrasound of the area showed an ill-defined region of hypoechoic tissue but no definite mass.  Biopsy of the right breast mass in question 12/05/2014 showed ([id number]) ductal carcinoma in situ, grade 1, arising within an intraductal papilloma. The cells were estrogen receptor 9200% positive, progesterone receptor 80-90% positive, both with strong staining intensity.  The patient's subsequent history is as detailed below.  INTERVAL HISTORY: [name] returns today for follow-up of her high-risk genetic mutation.   The patient  continues screening for melanoma and pancreatic cancer in the setting of CDKN2A mutation.   Since her last visit here, she underwent an abdomen MRI with and without contrast (including MRCP) on 01/16/2017, showing a stable examination. There is no evidence of pancreatic malignancy or acute process. There is cholelithiasis without evidence of cholecystitis or biliary dilatation.   On 12/14/2017, she had a repeat abdomen MRI with and without contrast (including MRCP) showing a normal pancreas. There is no pancreatic lesion. There is no hepatic lesion. There is normal biliary tree. There is cholelithiasis without evidence cholecystitis.  She also sees her dermatologist, Dr. Renda Rolls, for melanoma screening. She sees her twice a year in April and October. The PA removed something from her stomach at her last visit, but it was benign.    REVIEW OF SYSTEMS: [name] is doing well overall.  She thought her liver enzymes are "out of wack." Her bilirubin is high, but she was diagnosed with Gilberts in her 6's.  The transaminases and alkaline phosphatase are fine.  The patient denies unusual headaches, visual changes, nausea, vomiting, or dizziness. There has been no unusual cough, phlegm production, or pleurisy. This been no change in bowel or bladder habits. The patient denies unexplained fatigue or unexplained weight loss, bleeding, rash, or fever.  For exercise she recently did a 10K walk jog with her daughter.  A detailed review of systems was otherwise noncontributory.    PAST MEDICAL HISTORY: Past Medical History:  Diagnosis Date  . Arthritis   . Breast cancer [facility]) 12/2014   Seeing [facility] doctors  . CAD (coronary artery disease)    open heart surgery 09/12/10  . Cancer [facility]) 2007   thyroid  . GERD (gastroesophageal reflux disease)   . Heart disease   . History of thyroid cancer   .  Hypothyroid   . Overweight(278.02)     PAST SURGICAL HISTORY: Past Surgical History:  Procedure  Laterality Date  . arm surgery     rt  . coronary artery bypass grafting x1  09/13/10   [place] Gum  . HERNIA REPAIR  1977   inguinal, ?side  . KNEE ARTHROSCOPY Right 09/13/2015   pt reported  . THYROIDECTOMY    . ULNAR NERVE REPAIR Right 1989, 2002    FAMILY HISTORY Family History  Problem Relation Age of Onset  . Hypertension Mother   . Colon polyps Mother   . Arthritis Mother   . Cancer Mother        breast  . Hypertension Father   . Hiatal hernia Father   . Hypertension Sister   . Lupus Maternal Aunt   . Heart disease Maternal Aunt   . Diabetes Maternal Aunt   . Arthritis Paternal Grandmother   . Diabetes Paternal Grandmother   . Parkinson's disease Paternal Grandmother   . Coronary artery disease Maternal Grandfather   . Cancer Maternal Aunt        breast, ovarian  . Diabetes Maternal Aunt   . Other Maternal Aunt        ?auto immune disease  . Colon cancer Neg Hx    The patient's parents are in their late 47s as of December 2016. The patient's mother was diagnosed with breast cancer age 2; on the maternal side, the maternal grandmother was diagnosed with uterine cancer at age 90. 2 maternal aunts had one breast cancer and one uterine cancer and one maternal uncle had colon cancer is were in their 3s and 64s. On the father's side there is an uncle with colon cancer age 63. There is no history of ovarian cancer to the patient's knowledge   GYNECOLOGIC HISTORY:  No LMP recorded. (Menstrual status: IUD). Menarche age 11, first live birth age 76. The patient is GX P2. She still having regular periods. She has a Mirena IUD in place.  SOCIAL HISTORY: (Updated 12/22/2017) [name] works as a Dealer. Her husband [name] is an Brewing technologist. Their son, [name], graduated from [employer] in [place] and is currently living at home. 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 Their daughter, [name] is currently at [employer] to take prerequisites for physical therapy school.     ADVANCED DIRECTIVES: Not in place   HEALTH MAINTENANCE: Social History   Tobacco Use  . Smoking status: Never Smoker  . Smokeless tobacco: Never Used  Substance Use Topics  . Alcohol use: Yes    Comment: rarely  . Drug use: No     Colonoscopy:  PAP: 2013  Bone density:  Lipid panel:  No Known Allergies  Current Outpatient Medications  Medication Sig Dispense Refill  . diphenhydrAMINE (BENADRYL) 25 mg capsule Take 25 mg by mouth at bedtime.     Marland Kitchen levonorgestrel (MIRENA) 20 MCG/24HR IUD 1 each by Intrauterine route once.      Marland Kitchen levothyroxine (SYNTHROID, LEVOTHROID) 150 MCG tablet Take 1 tablet (150 mcg total) by mouth every morning. 90 tablet 3  . omeprazole (PRILOSEC) 10 MG capsule Take 1 capsule (10 mg total) by mouth 2 (two) times daily. 180 capsule 1   No current facility-administered medications for this visit.     OBJECTIVE: Middle-aged white woman in no acute distress Vitals:   12/22/17 0945  BP: 124/85  Pulse: 65  Resp: 18  Temp: 98.5 F (36.9 C)  SpO2: 99%     Body  mass index is 30.55 kg/m.    ECOG FS:0 - Asymptomatic  Sclerae unicteric, pupils round and equal Oropharynx clear and moist No cervical or supraclavicular adenopathy Lungs no rales or rhonchi Heart regular rate and rhythm Abd soft, nontender, positive bowel sounds MSK no focal spinal tenderness, no upper extremity lymphedema Neuro: nonfocal, well oriented, appropriate affect Breasts: Status post bilateral mastectomies with D IEP reconstruction.  There are no suspicious findings.  Both axillae are benign  LAB RESULTS:  CMP     Component Value Date/Time   NA 138 12/08/2017 0902   NA 139 10/21/2016 0846   K 4.0 12/08/2017 0902   K 4.2 10/21/2016 0846   CL 107 12/08/2017 0902   CO2 24 12/08/2017 0902   CO2 24 10/21/2016 0846   GLUCOSE 90 12/08/2017 0902   GLUCOSE 91 10/21/2016 0846   BUN 12 12/08/2017 0902   BUN 10.1 10/21/2016 0846   CREATININE 0.89 12/08/2017 0902    CREATININE 1.0 10/21/2016 0846   CALCIUM 10.0 12/08/2017 0902   CALCIUM 9.1 10/21/2016 0846   PROT 6.8 12/08/2017 0902   PROT 6.6 10/21/2016 0846   ALBUMIN 3.9 12/08/2017 0902   ALBUMIN 3.7 10/21/2016 0846   AST 23 12/08/2017 0902   AST 28 10/21/2016 0846   ALT 19 12/08/2017 0902   ALT 28 10/21/2016 0846   ALKPHOS 90 12/08/2017 0902   ALKPHOS 100 10/21/2016 0846   BILITOT 1.9 (H) 12/08/2017 0902   BILITOT 1.18 10/21/2016 0846   GFRNONAA >60 12/08/2017 0902   GFRAA >60 12/08/2017 0902    INo results found for: SPEP, UPEP  Lab Results  Component Value Date   WBC 7.0 12/08/2017   NEUTROABS 5.1 12/08/2017   HGB 13.8 12/08/2017   HCT 40.4 12/08/2017   MCV 84.7 12/08/2017   PLT 248 12/08/2017      Chemistry      Component Value Date/Time   NA 138 12/08/2017 0902   NA 139 10/21/2016 0846   K 4.0 12/08/2017 0902   K 4.2 10/21/2016 0846   CL 107 12/08/2017 0902   CO2 24 12/08/2017 0902   CO2 24 10/21/2016 0846   BUN 12 12/08/2017 0902   BUN 10.1 10/21/2016 0846   CREATININE 0.89 12/08/2017 0902   CREATININE 1.0 10/21/2016 0846      Component Value Date/Time   CALCIUM 10.0 12/08/2017 0902   CALCIUM 9.1 10/21/2016 0846   ALKPHOS 90 12/08/2017 0902   ALKPHOS 100 10/21/2016 0846   AST 23 12/08/2017 0902   AST 28 10/21/2016 0846   ALT 19 12/08/2017 0902   ALT 28 10/21/2016 0846   BILITOT 1.9 (H) 12/08/2017 0902   BILITOT 1.18 10/21/2016 0846       No results found for: LABCA2  No components found for: LABCA125  No results for input(s): INR in the last 168 hours.  Urinalysis    Component Value Date/Time   COLORURINE DARK YELLOW 02/20/2015 0705   APPEARANCEUR TURBID (A) 02/20/2015 0705   LABSPEC 1.023 02/20/2015 0705   PHURINE 5.5 02/20/2015 0705   GLUCOSEU NEGATIVE 02/20/2015 0705   GLUCOSEU NEGATIVE 01/30/2015 1428   HGBUR NEGATIVE 02/20/2015 0705   BILIRUBINUR NEGATIVE 02/20/2015 0705   KETONESUR NEGATIVE 02/20/2015 0705   PROTEINUR NEGATIVE  02/20/2015 0705   UROBILINOGEN 0.2 01/30/2015 1428   NITRITE NEGATIVE 02/20/2015 0705   LEUKOCYTESUR NEGATIVE 02/20/2015 0705    STUDIES: Mr 3d Recon At Scanner  Result Date: 12/14/2017 CLINICAL DATA:  Follow up. History of multiple  endocrine neoplasia type 1. History of breast and thyroid cancer with predisposition to pancreatic cancer. Prior exam in pacs. 7.25ml of gadavist given. Hepatocellular carcinoma screen, high risk known hx of breast and thyroid cancer w/ positive genetic mutation CDKN2A with hx of abnormal labs; Hepatocellular carcinoma screen, high risk mrcp EXAM: MRI ABDOMEN WITHOUT AND WITH CONTRAST (INCLUDING MRCP) TECHNIQUE: Multiplanar multisequence MR imaging of the abdomen was performed both before and after the administration of intravenous contrast. Heavily T2-weighted images of the biliary and pancreatic ducts were obtained, and three-dimensional MRCP images were rendered by post processing. CONTRAST:  7.5 mL Gadavist COMPARISON:  MRI 01/16/2017 FINDINGS: Lower chest:  Lung bases are clear. Hepatobiliary: No focal hepatic lesion.  No biliary duct dilatation. There several gallstones in the fundus the gallbladder. No gallbladder inflammation. No intrahepatic bile duct dilatation. No extrahepatic biliary duct dilatation. The common bile duct normal caliber. Pancreas: Normal pancreatic parenchyma. No pancreatic duct dilatation. No pancreatic inflammation. No enhancing pancreatic lesion. Spleen: Normal spleen. Adrenals/urinary tract: Adrenal glands and kidneys are normal. Stomach/Bowel: Stomach and limited of the small bowel is unremarkable Vascular/Lymphatic: Abdominal aortic normal caliber. No retroperitoneal periportal lymphadenopathy. Musculoskeletal: No aggressive osseous lesion IMPRESSION: 1. Normal pancreas.  No pancreatic lesion. 2. No hepatic lesion. 3. Normal biliary tree. 4. Cholelithiasis without evidence cholecystitis Electronically Signed   By: Suzy Bouchard M.D.   On:  12/14/2017 09:43   Mr Abdomen Mrcp Moise Boring Contast  Result Date: 12/14/2017 CLINICAL DATA:  Follow up. History of multiple endocrine neoplasia type 1. History of breast and thyroid cancer with predisposition to pancreatic cancer. Prior exam in pacs. 7.28ml of gadavist given. Hepatocellular carcinoma screen, high risk known hx of breast and thyroid cancer w/ positive genetic mutation CDKN2A with hx of abnormal labs; Hepatocellular carcinoma screen, high risk mrcp EXAM: MRI ABDOMEN WITHOUT AND WITH CONTRAST (INCLUDING MRCP) TECHNIQUE: Multiplanar multisequence MR imaging of the abdomen was performed both before and after the administration of intravenous contrast. Heavily T2-weighted images of the biliary and pancreatic ducts were obtained, and three-dimensional MRCP images were rendered by post processing. CONTRAST:  7.5 mL Gadavist COMPARISON:  MRI 01/16/2017 FINDINGS: Lower chest:  Lung bases are clear. Hepatobiliary: No focal hepatic lesion.  No biliary duct dilatation. There several gallstones in the fundus the gallbladder. No gallbladder inflammation. No intrahepatic bile duct dilatation. No extrahepatic biliary duct dilatation. The common bile duct normal caliber. Pancreas: Normal pancreatic parenchyma. No pancreatic duct dilatation. No pancreatic inflammation. No enhancing pancreatic lesion. Spleen: Normal spleen. Adrenals/urinary tract: Adrenal glands and kidneys are normal. Stomach/Bowel: Stomach and limited of the small bowel is unremarkable Vascular/Lymphatic: Abdominal aortic normal caliber. No retroperitoneal periportal lymphadenopathy. Musculoskeletal: No aggressive osseous lesion IMPRESSION: 1. Normal pancreas.  No pancreatic lesion. 2. No hepatic lesion. 3. Normal biliary tree. 4. Cholelithiasis without evidence cholecystitis Electronically Signed   By: Suzy Bouchard M.D.   On: 12/14/2017 09:43    ASSESSMENT: 47 y.o. [place] woman status post right breast lower inner quadrant biopsy  12/05/2014 for ductal carcinoma in situ arising from an intraductal papilloma, estrogen and progesterone receptor positive  (1) genetics testing at [facility] obtained December 2016 showed a deleterious mutation in CDKN2A (specifically c.9_32dup [Ala3 Pro11dup]).  The patient does not carry theCHEK2  Deletion present in her mother.  (a)  Patients who carry a  CDKN2A mutation are at high risk of melanoma  (28-76% lifetime risk) and pancreatic cancer (17% lifetime risk).  (2)  Status post bilateral mastectomies with immediate DIEP reconstruction  02/24//2017 showing  (a)  On the left side, benign breast tissue  (b)  On the right side, pTis pN0, stage 0 DCIS  measuring, 2.8 cm, estrogen receptor and progesterone receptor positive,   (3)  Screening for melanoma with biannual skin exam is recommended ([place])   (4) screening for pancreatic cancer will consist of by annual MRI/MRCP  (a) abdominal MRI 04/12/2017shows only cholelithiasis and mild hepatomegaly  (b) abdominal MRI/MRCP 10/17/2015 showed normal pancreas, mild hepatomegaly, and cholelithiasis  (c) repeat abdominal MRCP is January 2019 and December 2019 show no pancreatic lesion  (5) Mirena IUD in place   PLAN:  Danyal is now nearly 3 years out from definitive surgery for her noninvasive breast cancer, which is almost certainly cured.  We are continuing to see her on a yearly basis for pancreatic cancer screening.  We have discussed the fact that there is no established screening for pancreatic cancer but our protocol does call for an MRCP yearly and our hope is that if any abnormality is found it can be found early enough to allow for cure  We reviewed her Gilbert's disease labs and she understands this is a benign condition.  Her liver function tests are entirely normal otherwise  I encouraged her to continue her exercise program  She knows to call for any other issues that may develop before her next visit here, which will be  in February 2021, after her next 2 MRCP's.   Mileidy Atkin, Virgie Dad, MD  12/22/17 10:20 AM Medical Oncology and Hematology [facility] [address] [place], [place] [address] Tel. [phone]    Fax. [phone]   I, Jacqualyn Posey am acting as a Education administrator for Chauncey Cruel, MD.   I, Lurline Del MD, have reviewed the above documentation for accuracy and completeness, and I agree with the above.

## 2017-12-22 ENCOUNTER — Inpatient Hospital Stay: Payer: BLUE CROSS/BLUE SHIELD | Attending: Oncology | Admitting: Oncology

## 2017-12-22 VITALS — BP 124/85 | HR 65 | Temp 98.5°F | Resp 18 | Ht 68.0 in | Wt 200.9 lb

## 2017-12-22 DIAGNOSIS — Z85830 Personal history of malignant neoplasm of bone: Secondary | ICD-10-CM

## 2017-12-22 DIAGNOSIS — Z1501 Genetic susceptibility to malignant neoplasm of breast: Secondary | ICD-10-CM

## 2017-12-22 DIAGNOSIS — Z1509 Genetic susceptibility to other malignant neoplasm: Secondary | ICD-10-CM | POA: Insufficient documentation

## 2017-12-22 DIAGNOSIS — Z803 Family history of malignant neoplasm of breast: Secondary | ICD-10-CM | POA: Insufficient documentation

## 2017-12-22 DIAGNOSIS — Z8049 Family history of malignant neoplasm of other genital organs: Secondary | ICD-10-CM | POA: Diagnosis not present

## 2017-12-22 DIAGNOSIS — Z9013 Acquired absence of bilateral breasts and nipples: Secondary | ICD-10-CM

## 2017-12-22 DIAGNOSIS — Z85850 Personal history of malignant neoplasm of thyroid: Secondary | ICD-10-CM | POA: Diagnosis not present

## 2017-12-22 DIAGNOSIS — Z170 Estrogen receptor positive status [ER+]: Secondary | ICD-10-CM | POA: Diagnosis not present

## 2017-12-22 DIAGNOSIS — Z800 Family history of malignant neoplasm of digestive organs: Secondary | ICD-10-CM | POA: Diagnosis not present

## 2017-12-22 DIAGNOSIS — C73 Malignant neoplasm of thyroid gland: Secondary | ICD-10-CM

## 2017-12-22 DIAGNOSIS — E039 Hypothyroidism, unspecified: Secondary | ICD-10-CM

## 2017-12-22 DIAGNOSIS — D0511 Intraductal carcinoma in situ of right breast: Secondary | ICD-10-CM

## 2017-12-22 DIAGNOSIS — Z79899 Other long term (current) drug therapy: Secondary | ICD-10-CM | POA: Insufficient documentation

## 2017-12-22 DIAGNOSIS — Z793 Long term (current) use of hormonal contraceptives: Secondary | ICD-10-CM | POA: Insufficient documentation

## 2017-12-22 DIAGNOSIS — Z975 Presence of (intrauterine) contraceptive device: Secondary | ICD-10-CM | POA: Diagnosis not present

## 2017-12-25 NOTE — Telephone Encounter (Signed)
Opened in error

## 2018-01-11 DIAGNOSIS — C50911 Malignant neoplasm of unspecified site of right female breast: Secondary | ICD-10-CM | POA: Diagnosis not present

## 2018-02-23 ENCOUNTER — Telehealth: Payer: Self-pay | Admitting: Oncology

## 2018-02-23 NOTE — Telephone Encounter (Signed)
R/s appt per 2/11 sch message - left message with appt date and time

## 2018-02-25 ENCOUNTER — Ambulatory Visit: Payer: BLUE CROSS/BLUE SHIELD | Admitting: Oncology

## 2018-06-03 DIAGNOSIS — L821 Other seborrheic keratosis: Secondary | ICD-10-CM | POA: Diagnosis not present

## 2018-06-03 DIAGNOSIS — D224 Melanocytic nevi of scalp and neck: Secondary | ICD-10-CM | POA: Diagnosis not present

## 2018-06-03 DIAGNOSIS — L814 Other melanin hyperpigmentation: Secondary | ICD-10-CM | POA: Diagnosis not present

## 2018-06-03 DIAGNOSIS — D225 Melanocytic nevi of trunk: Secondary | ICD-10-CM | POA: Diagnosis not present

## 2018-07-02 ENCOUNTER — Encounter: Payer: Self-pay | Admitting: Family Medicine

## 2018-07-02 ENCOUNTER — Other Ambulatory Visit: Payer: Self-pay

## 2018-07-02 ENCOUNTER — Ambulatory Visit (INDEPENDENT_AMBULATORY_CARE_PROVIDER_SITE_OTHER): Payer: BC Managed Care – PPO | Admitting: Family Medicine

## 2018-07-02 VITALS — BP 133/92 | HR 85 | Wt 194.0 lb

## 2018-07-02 DIAGNOSIS — Z01419 Encounter for gynecological examination (general) (routine) without abnormal findings: Secondary | ICD-10-CM

## 2018-07-02 NOTE — Progress Notes (Signed)
GYNECOLOGY ANNUAL PREVENTATIVE CARE ENCOUNTER NOTE  Subjective:   [name] is a 48 y.o. No obstetric history on file. female here for a routine annual gynecologic exam.  Current complaints: none.   Denies abnormal vaginal bleeding, discharge, pelvic pain, problems with intercourse or other gynecologic concerns.    No perimenopausal symptoms: hot flashes, vaginal dryness, etc. Has Ln IUD. S/p bilateral mastectomy due to breast cancer. No mammogram needed.  Gynecologic History No LMP recorded. (Menstrual status: IUD). Patient is sexually active  Contraception: IUD Last Pap: 2017. Results were: normal Last mammogram: n/a  Obstetric History OB History  No obstetric history on file.    Past Medical History:  Diagnosis Date  . Arthritis   . Breast cancer [facility]) 12/2014   Seeing [facility] doctors  . CAD (coronary artery disease)    open heart surgery 09/12/10  . Cancer [facility]) 2007   thyroid  . GERD (gastroesophageal reflux disease)   . Heart disease   . History of thyroid cancer   . Hypothyroid   . Overweight(278.02)     Past Surgical History:  Procedure Laterality Date  . arm surgery     rt  . coronary artery bypass grafting x1  09/13/10   [place] Gum  . HERNIA REPAIR  1977   inguinal, ?side  . KNEE ARTHROSCOPY Right 09/13/2015   pt reported  . THYROIDECTOMY    . ULNAR NERVE REPAIR Right 1989, 2002    Current Outpatient Medications on File Prior to Visit  Medication Sig Dispense Refill  . diphenhydrAMINE (BENADRYL) 25 mg capsule Take 25 mg by mouth at bedtime.     Marland Kitchen levonorgestrel (MIRENA) 20 MCG/24HR IUD 1 each by Intrauterine route once.      Marland Kitchen levothyroxine (SYNTHROID, LEVOTHROID) 150 MCG tablet Take 1 tablet (150 mcg total) by mouth every morning. 90 tablet 3  . omeprazole (PRILOSEC) 10 MG capsule Take 1 capsule (10 mg total) by mouth 2 (two) times daily. 180 capsule 1   No current facility-administered medications on file prior to visit.     No Known  Allergies  Social History   Socioeconomic History  . Marital status: Married    Spouse name: Not on file  . Number of children: 2  . Years of education: 25  . Highest education level: Not on file  Occupational History  . Occupation: Building control surveyor: [employer]  Social Needs  . Financial resource strain: Not on file  . Food insecurity    Worry: Not on file    Inability: Not on file  . Transportation needs    Medical: Not on file    Non-medical: Not on file  Tobacco Use  . Smoking status: Never Smoker  . Smokeless tobacco: Never Used  Substance and Sexual Activity  . Alcohol use: Yes    Comment: rarely  . Drug use: No  . Sexual activity: Yes    Birth control/protection: I.U.D.  Lifestyle  . Physical activity    Days per week: Not on file    Minutes per session: Not on file  . Stress: Not on file  Relationships  . Social Herbalist on phone: Not on file    Gets together: Not on file    Attends religious service: Not on file    Active member of club or organization: Not on file    Attends meetings of clubs or organizations: Not on file    Relationship status:  Not on file  . Intimate partner violence    Fear of current or ex partner: Not on file    Emotionally abused: Not on file    Physically abused: Not on file    Forced sexual activity: Not on file  Other Topics Concern  . Not on file  Social History Narrative   Regular exercise-yes   Caffeine Use-yes   Works as Patent examiner   [phone]- [phone]   Married   Enjoys Marine scientist and a Scientist, research (physical sciences) degree    Family History  Problem Relation Age of Onset  . Hypertension Mother   . Colon polyps Mother   . Arthritis Mother   . Cancer Mother        breast  . Hypertension Father   . Hiatal hernia Father   . Hypertension Sister   . Lupus Maternal Aunt   . Heart disease Maternal Aunt   . Diabetes Maternal Aunt   . Arthritis Paternal Grandmother   .  Diabetes Paternal Grandmother   . Parkinson's disease Paternal Grandmother   . Coronary artery disease Maternal Grandfather   . Cancer Maternal Aunt        breast, ovarian  . Diabetes Maternal Aunt   . Other Maternal Aunt        ?auto immune disease  . Colon cancer Neg Hx     The following portions of the patient's history were reviewed and updated as appropriate: allergies, current medications, past family history, past medical history, past social history, past surgical history and problem list.  Review of Systems Pertinent items are noted in HPI.   Objective:  BP (!) 133/92   Pulse 85   Wt 194 lb (88 kg)   BMI 29.50 kg/m  Wt Readings from Last 3 Encounters:  07/02/18 194 lb (88 kg)  12/22/17 200 lb 14.4 oz (91.1 kg)  11/16/17 206 lb 12.8 oz (93.8 kg)     CONSTITUTIONAL: Well-developed, well-nourished female in no acute distress.  HENT:  Normocephalic, atraumatic, External right and left ear normal. Oropharynx is clear and moist EYES: Conjunctivae and EOM are normal. Pupils are equal, round, and reactive to light. No scleral icterus.  NECK: Normal range of motion, supple, no masses.  Normal thyroid.   CARDIOVASCULAR: Normal heart rate noted, regular rhythm RESPIRATORY: Clear to auscultation bilaterally. Effort and breath sounds normal, no problems with respiration noted. BREASTS: S/p mastectomy with reconstruction. No deformation to skin. Scars well healed. Symmetric in size. No masses, skin changes, or lymphadenopathy. ABDOMEN: Soft, normal bowel sounds, no distention noted.  No tenderness, rebound or guarding.  PELVIC: Normal appearing external genitalia; normal appearing vaginal mucosa and cervix.  No abnormal discharge noted.  Normal uterine size, no other palpable masses, no uterine or adnexal tenderness. IUD strings seen on exam. MUSCULOSKELETAL: Normal range of motion. No tenderness.  No cyanosis, clubbing, or edema.  2+ distal pulses. SKIN: Skin is warm and dry. No  rash noted. Not diaphoretic. No erythema. No pallor. NEUROLOGIC: Alert and oriented to person, place, and time. Normal reflexes, muscle tone coordination. No cranial nerve deficit noted. PSYCHIATRIC: Normal mood and affect. Normal behavior. Normal judgment and thought content.  Assessment:  Annual gynecologic examination with pap smear   Plan:  1. Well Woman Exam Will follow up results of pap smear and manage accordingly. Mammogram scheduled STD testing discussed. Patient declined testing - Cytology - PAP  2. H/o breast cancer No concerns. Has  f/u with oncology at end of year.  Routine preventative health maintenance measures emphasized. Please refer to After Visit Summary for other counseling recommendations.    Loma Boston, [place] for [employer]

## 2018-07-06 LAB — CYTOLOGY - PAP
Diagnosis: NEGATIVE
HPV: NOT DETECTED

## 2018-09-29 ENCOUNTER — Other Ambulatory Visit: Payer: Self-pay | Admitting: Family Medicine

## 2018-09-29 DIAGNOSIS — E890 Postprocedural hypothyroidism: Secondary | ICD-10-CM

## 2018-10-13 ENCOUNTER — Ambulatory Visit (INDEPENDENT_AMBULATORY_CARE_PROVIDER_SITE_OTHER): Payer: BC Managed Care – PPO | Admitting: Family Medicine

## 2018-10-13 ENCOUNTER — Other Ambulatory Visit: Payer: Self-pay

## 2018-10-13 ENCOUNTER — Encounter: Payer: Self-pay | Admitting: Family Medicine

## 2018-10-13 VITALS — BP 122/82 | HR 82 | Temp 97.4°F | Ht 68.0 in | Wt 196.2 lb

## 2018-10-13 DIAGNOSIS — Z0000 Encounter for general adult medical examination without abnormal findings: Secondary | ICD-10-CM

## 2018-10-13 DIAGNOSIS — E890 Postprocedural hypothyroidism: Secondary | ICD-10-CM

## 2018-10-13 DIAGNOSIS — Z23 Encounter for immunization: Secondary | ICD-10-CM | POA: Diagnosis not present

## 2018-10-13 DIAGNOSIS — K219 Gastro-esophageal reflux disease without esophagitis: Secondary | ICD-10-CM

## 2018-10-13 MED ORDER — LEVOTHYROXINE SODIUM 150 MCG PO TABS: 150.0000 ug | ORAL_TABLET | Freq: Every morning | ORAL | 3 refills | Status: DC

## 2018-10-13 NOTE — Patient Instructions (Addendum)
Give [place] 2-3 business days to get the results of your labs back.   Keep the diet clean and stay active.  1% hydrocortisone cream twice daily for 10 days over the ear area.  Claritin (loratadine), Allegra (fexofenadine), Zyrtec (cetirizine) which is also equivalent to Xyzal (levocetirizine); these are listed in order from weakest to strongest. Generic, and therefore cheaper, options are in the parentheses.   Flonase (fluticasone); nasal spray that is over the counter. 2 sprays each nostril, once daily. Aim towards the same side eye when you spray.  There are available OTC, and the generic versions, which may be cheaper, are in parentheses. Show this to a pharmacist if you have trouble finding any of these items.  Let us know if you need anything.

## 2018-10-13 NOTE — Progress Notes (Signed)
Chief Complaint  Patient presents with  . Annual Exam     Well Woman [name] is here for a complete physical.   Her last physical was >1 year ago.  Current diet: in general, a "pretty healthy" diet. Current exercise: running. Weight is stable and she denies daytime fatigue. No LMP recorded. (Menstrual status: IUD). Seatbelt? Yes  Health Maintenance Pap/HPV- Yes Tetanus- Yes HIV screening- Yes  Past Medical History:  Diagnosis Date  . Arthritis   . Breast cancer [facility]) 12/2014   Seeing [facility] doctors  . CAD (coronary artery disease)    open heart surgery 09/12/10  . Cancer [facility]) 2007   thyroid  . GERD (gastroesophageal reflux disease)   . Heart disease   . History of thyroid cancer   . Hypothyroid   . Overweight(278.02)      Past Surgical History:  Procedure Laterality Date  . arm surgery     rt  . coronary artery bypass grafting x1  09/13/10   [place] Gum  . HERNIA REPAIR  1977   inguinal, ?side  . KNEE ARTHROSCOPY Right 09/13/2015   pt reported  . THYROIDECTOMY    . ULNAR NERVE REPAIR Right 1989, 2002    Medications  Current Outpatient Medications on File Prior to Visit  Medication Sig Dispense Refill  . diphenhydrAMINE (BENADRYL) 25 mg capsule Take 25 mg by mouth at bedtime.     Marland Kitchen levonorgestrel (MIRENA) 20 MCG/24HR IUD 1 each by Intrauterine route once.      Marland Kitchen levothyroxine (SYNTHROID, LEVOTHROID) 150 MCG tablet Take 1 tablet (150 mcg total) by mouth every morning. 90 tablet 3  . omeprazole (PRILOSEC) 10 MG capsule Take 1 capsule (10 mg total) by mouth 2 (two) times daily. 180 capsule 1   Allergies  No Known Allergies  Review of Systems: Constitutional:  no unexpected weight changes Eye:  no recent significant change in vision Ear/Nose/Mouth/Throat:  Ears:  no tinnitus or vertigo and no recent change in hearing Nose/Mouth/Throat:  no complaints of nasal congestion, +runny nose, no sore throat Cardiovascular: no chest pain Respiratory:  no  cough and no shortness of breath Gastrointestinal:  no abdominal pain, no change in bowel habits GU:  Female: negative for dysuria or pelvic pain Musculoskeletal/Extremities:  no pain of the joints Integumentary (Skin/Breast):  no abnormal skin lesions reported Neurologic:  no headaches Endocrine:  denies fatigue Hematologic/Lymphatic:  No areas of easy bleeding  Exam BP 122/82 (BP Location: Left Arm, Patient Position: Sitting, Cuff Size: Normal)   Pulse 82   Temp (!) 97.4 F (36.3 C) (Temporal)   Ht 5\' 8"  (1.727 m)   Wt 196 lb 4 oz (89 kg)   SpO2 97%   BMI 29.84 kg/m  General:  well developed, well nourished, in no apparent distress Skin:  no significant moles, warts, or growths Head:  no masses, lesions, or tenderness Eyes:  pupils equal and round, sclera anicteric without injection Ears:  canals without lesions, TMs shiny without retraction, no obvious effusion, no erythema Nose:  nares patent, septum midline, mucosa normal, and no drainage or sinus tenderness Throat/Pharynx:  lips and gingiva without lesion; tongue and uvula midline; non-inflamed pharynx; no exudates or postnasal drainage Neck: neck supple without adenopathy, thyromegaly, or masses Lungs:  clear to auscultation, breath sounds equal bilaterally, no respiratory distress Cardio:  regular rate and rhythm, no bruits, no LE edema Abdomen:  abdomen soft, nontender; bowel sounds normal; no masses or organomegaly Genital: Defer to GYN Musculoskeletal:  symmetrical muscle groups noted without atrophy or deformity Extremities:  no clubbing, cyanosis, or edema, no deformities, no skin discoloration Neuro:  gait normal; deep tendon reflexes normal and symmetric Psych: well oriented with normal range of affect and appropriate judgment/insight  Assessment and Plan  Well adult exam - Plan: CBC, Comprehensive metabolic panel, Lipid panel, TSH  Postoperative hypothyroidism - Plan: levothyroxine (SYNTHROID) 150 MCG  tablet  Gastroesophageal reflux disease, esophagitis presence not specified  Need for influenza vaccination - Plan: Flu Vaccine QUAD 6+ mos PF IM (Fluarix Quad PF)   Well 49 y.o. female. Counseled on diet and exercise. Other orders as above. Follow up in 6 mo or prn. The patient voiced understanding and agreement to the plan.  [place], DO 10/13/18 4:33 PM

## 2018-10-20 ENCOUNTER — Other Ambulatory Visit: Payer: Self-pay

## 2018-10-20 ENCOUNTER — Other Ambulatory Visit (INDEPENDENT_AMBULATORY_CARE_PROVIDER_SITE_OTHER): Payer: BC Managed Care – PPO

## 2018-10-20 DIAGNOSIS — Z0000 Encounter for general adult medical examination without abnormal findings: Secondary | ICD-10-CM | POA: Diagnosis not present

## 2018-10-20 LAB — COMPREHENSIVE METABOLIC PANEL
ALT: 23 U/L (ref 0–35)
AST: 21 U/L (ref 0–37)
Albumin: 3.8 g/dL (ref 3.5–5.2)
Alkaline Phosphatase: 88 U/L (ref 39–117)
BUN: 13 mg/dL (ref 6–23)
CO2: 26 mEq/L (ref 19–32)
Calcium: 9.3 mg/dL (ref 8.4–10.5)
Chloride: 107 mEq/L (ref 96–112)
Creatinine, Ser: 0.98 mg/dL (ref 0.40–1.20)
GFR: 60.12 mL/min (ref >60.00)
Glucose, Bld: 89 mg/dL (ref 70–99)
Potassium: 3.7 mEq/L (ref 3.5–5.1)
Sodium: 138 mEq/L (ref 135–145)
Total Bilirubin: 1.2 mg/dL (ref 0.2–1.2)
Total Protein: 6.2 g/dL (ref 6.0–8.3)

## 2018-10-20 LAB — CBC
HCT: 36.8 % (ref 36.0–46.0)
Hemoglobin: 12.7 g/dL (ref 12.0–15.0)
MCHC: 34.4 g/dL (ref 30.0–36.0)
MCV: 85.9 fl (ref 78.0–100.0)
Platelets: 234 10*3/uL (ref 150.0–400.0)
RBC: 4.28 Mil/uL (ref 3.87–5.11)
RDW: 13.5 % (ref 11.5–15.5)
WBC: 5.7 10*3/uL (ref 4.0–10.5)

## 2018-10-20 LAB — LIPID PANEL
Cholesterol: 149 mg/dL (ref 0–200)
HDL: 40.3 mg/dL (ref >39.00)
LDL Cholesterol: 90 mg/dL (ref 0–99)
NonHDL: 108.73
Total CHOL/HDL Ratio: 4
Triglycerides: 95 mg/dL (ref 0.0–149.0)
VLDL: 19 mg/dL (ref 0.0–40.0)

## 2018-10-20 LAB — TSH: TSH: 0.61 u[IU]/mL (ref 0.35–4.50)

## 2018-11-18 ENCOUNTER — Encounter: Payer: Self-pay | Admitting: Cardiovascular Disease

## 2018-11-18 ENCOUNTER — Other Ambulatory Visit: Payer: Self-pay

## 2018-11-18 ENCOUNTER — Ambulatory Visit: Payer: BC Managed Care – PPO | Admitting: Cardiovascular Disease

## 2018-11-18 VITALS — BP 116/88 | HR 78 | Ht 68.0 in | Wt 196.8 lb

## 2018-11-18 DIAGNOSIS — I2510 Atherosclerotic heart disease of native coronary artery without angina pectoris: Secondary | ICD-10-CM | POA: Diagnosis not present

## 2018-11-18 DIAGNOSIS — E782 Mixed hyperlipidemia: Secondary | ICD-10-CM | POA: Diagnosis not present

## 2018-11-18 NOTE — Progress Notes (Signed)
Cardiology Office Note:    Date:  11/18/2018   ID:  [name], DOB Dec 02, 1970, MRN [id number]  PCP:  Shelda Pal, DO  Cardiologist:  Sherren Mocha, MD  Electrophysiologist:  None   Referring MD: Shelda Pal*   Chief Complaint  Patient presents with  . Coronary Artery Disease    History of Present Illness:    [name] is a 48 y.o. female with a hx of spontaneous coronary artery dissection, returning for follow-up evaluation.  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 She underwent single-vessel CABG. Follow-up gated cardiac CTA demonstrated healing of the native RCA and continued patency of the saphenous vein graft RCA. LV function at follow-up has been normal with an ejection fraction of 55-60%.   She is here alone today. Today, she denies symptoms of palpitations, chest pain, shortness of breath, orthopnea, PND, lower extremity edema, dizziness, or syncope. Sometimes her HR is up to 180 bpm on her Apple watch with exercise. Otherwise no concerns.   Past Medical History:  Diagnosis Date  . Arthritis   . Breast cancer [facility]) 12/2014   Seeing [facility] doctors  . CAD (coronary artery disease)    open heart surgery 09/12/10  . Cancer [facility]) 2007   thyroid  . GERD (gastroesophageal reflux disease)   . Heart disease   . History of thyroid cancer   . Hypothyroid   . Overweight(278.02)     Past Surgical History:  Procedure Laterality Date  . arm surgery     rt  . coronary artery bypass grafting x1  09/13/10   [place]um  . HERNIA REPAIR  1977   inguinal, ?side  . KNEE ARTHROSCOPY Right 09/13/2015   pt reported  . THYROIDECTOMY    . ULNAR NERVE REPAIR Right 1989, 2002    Current Medications: Current Meds  Medication Sig  . diphenhydrAMINE (BENADRYL) 25 mg capsule Take 25 mg by mouth as needed.   Marland Kitchen levonorgestrel  (MIRENA) 20 MCG/24HR IUD 1 each by Intrauterine route once.    Marland Kitchen levothyroxine (SYNTHROID) 150 MCG tablet Take 1 tablet (150 mcg total) by mouth every morning.  Marland Kitchen omeprazole (PRILOSEC) 10 MG capsule Take 1 capsule (10 mg total) by mouth 2 (two) times daily.     Allergies:   Patient has no known allergies.   Social History   Socioeconomic History  . Marital status: Married    Spouse name: Not on file  . Number of children: 2  . Years of education: 18  . Highest education level: Not on file  Occupational History  . Occupation: Building control surveyor: [employer]  Social Needs  . Financial resource strain: Not on file  . Food insecurity    Worry: Not on file    Inability: Not on file  . Transportation needs    Medical: Not on file    Non-medical: Not on file  Tobacco Use  . Smoking status: Never Smoker  . Smokeless tobacco: Never Used  Substance and Sexual Activity  . Alcohol use: Yes    Comment: rarely  . Drug use: No  . Sexual activity: Yes    Birth control/protection: I.U.D.  Lifestyle  . Physical activity    Days per week: Not on file    Minutes per session: Not on file  . Stress: Not on file  Relationships  . Social  connections    Talks on phone: Not on file    Gets together: Not on file    Attends religious service: Not on file    Active member of club or organization: Not on file    Attends meetings of clubs or organizations: Not on file    Relationship status: Not on file  Other Topics Concern  . Not on file  Social History Narrative   Regular exercise-yes   Caffeine Use-yes   Works as Patent examiner   [phone]- [phone]   Married   Enjoys Marine scientist and a Scientist, research (physical sciences) degree     Family History: The patient's family history includes Arthritis in her mother and paternal grandmother; Cancer in her maternal aunt and mother; Colon polyps in her mother; Coronary artery disease in her maternal grandfather; Diabetes in her  maternal aunt, maternal aunt, and paternal grandmother; Heart disease in her maternal aunt; Hiatal hernia in her father; Hypertension in her father, mother, and sister; Lupus in her maternal aunt; Other in her maternal aunt; Parkinson's disease in her paternal grandmother. There is no history of Colon cancer.  ROS:   Please see the history of present illness.    All other systems reviewed and are negative.  EKGs/Labs/Other Studies Reviewed:    EKG:  EKG is not ordered today.    Recent Labs: 10/20/2018: ALT 23; BUN 13; Creatinine, Ser 0.98; Hemoglobin 12.7; Platelets 234.0; Potassium 3.7; Sodium 138; TSH 0.61  Recent Lipid Panel    Component Value Date/Time   CHOL 149 10/20/2018 0726   TRIG 95.0 10/20/2018 0726   HDL 40.30 10/20/2018 0726   CHOLHDL 4 10/20/2018 0726   VLDL 19.0 10/20/2018 0726   LDLCALC 90 10/20/2018 0726    Physical Exam:    VS:  BP 116/88   Pulse 78   Ht 5\' 8"  (1.727 m)   Wt 196 lb 12.8 oz (89.3 kg)   SpO2 97%   BMI 29.92 kg/m     Wt Readings from Last 3 Encounters:  11/18/18 196 lb 12.8 oz (89.3 kg)  10/13/18 196 lb 4 oz (89 kg)  07/02/18 194 lb (88 kg)     GEN:  Well nourished, well developed in no acute distress HEENT: Normal NECK: No JVD; No carotid bruits LYMPHATICS: No lymphadenopathy CARDIAC: RRR, no murmurs, rubs, gallops RESPIRATORY:  Clear to auscultation without rales, wheezing or rhonchi  ABDOMEN: Soft, non-tender, non-distended MUSCULOSKELETAL:  No edema; No deformity  SKIN: Warm and dry NEUROLOGIC:  Alert and oriented x 3 PSYCHIATRIC:  Normal affect   ASSESSMENT:    1. Coronary artery disease involving native coronary artery of native heart without angina pectoris   2. Mixed hyperlipidemia    PLAN:    In order of problems listed above:  1. Doing well with no symptoms. Hx of SCAD, no atherosclerotic disease. Discussed her exercise program and heart rate goals. She understands her max predicted HR is about 170 bpm.  2. LDL  cholesterol on recent labs 90 mg/dL. She is on no medications. Lifestyle discussed with exercise and diet.    Medication Adjustments/Labs and Tests Ordered: Current medicines are reviewed at length with the patient today.  Concerns regarding medicines are outlined above.  No orders of the defined types were placed in this encounter.  No orders of the defined types were placed in this encounter.   Patient Instructions  Medication Instructions:  Your provider recommends that you continue on  your current medications as directed. Please refer to the Current Medication list given to you today.   *If you need a refill on your cardiac medications before your next appointment, please call your pharmacy*   Follow-Up: At [facility], you and your health needs are our priority.  As part of our continuing mission to provide you with exceptional heart care, we have created designated Provider Care Teams.  These Care Teams include your primary Cardiologist (physician) and Advanced Practice Providers (APPs -  Physician Assistants and Nurse Practitioners) who all work together to provide you with the care you need, when you need it.  Your next appointment:   12 months The format for your next appointment:   In Person Provider:   You may see Sherren Mocha, MD or one of the following Advanced Practice Providers on your designated Care Team:    Richardson Dopp, PA-C  Vin [place], PA-C  Daune Perch, [place]     Signed, Sherren Mocha, MD  11/18/2018 4:20 PM    [place]

## 2018-11-18 NOTE — Patient Instructions (Signed)

## 2018-12-01 DIAGNOSIS — Z23 Encounter for immunization: Secondary | ICD-10-CM | POA: Diagnosis not present

## 2018-12-01 DIAGNOSIS — D225 Melanocytic nevi of trunk: Secondary | ICD-10-CM | POA: Diagnosis not present

## 2018-12-01 DIAGNOSIS — L821 Other seborrheic keratosis: Secondary | ICD-10-CM | POA: Diagnosis not present

## 2018-12-01 DIAGNOSIS — D224 Melanocytic nevi of scalp and neck: Secondary | ICD-10-CM | POA: Diagnosis not present

## 2018-12-01 DIAGNOSIS — L814 Other melanin hyperpigmentation: Secondary | ICD-10-CM | POA: Diagnosis not present

## 2018-12-21 ENCOUNTER — Other Ambulatory Visit: Payer: Self-pay | Admitting: Family Medicine

## 2018-12-21 DIAGNOSIS — K219 Gastro-esophageal reflux disease without esophagitis: Secondary | ICD-10-CM

## 2018-12-21 DIAGNOSIS — M7062 Trochanteric bursitis, left hip: Secondary | ICD-10-CM | POA: Diagnosis not present

## 2018-12-21 DIAGNOSIS — M76811 Anterior tibial syndrome, right leg: Secondary | ICD-10-CM | POA: Diagnosis not present

## 2018-12-21 MED ORDER — OMEPRAZOLE 10 MG PO CPDR: 10.0000 mg | DELAYED_RELEASE_CAPSULE | Freq: Two times a day (BID) | ORAL | 1 refills | Status: DC

## 2019-01-12 ENCOUNTER — Telehealth: Payer: Self-pay | Admitting: *Deleted

## 2019-01-12 DIAGNOSIS — R7989 Other specified abnormal findings of blood chemistry: Secondary | ICD-10-CM

## 2019-01-12 DIAGNOSIS — Z1501 Genetic susceptibility to malignant neoplasm of breast: Secondary | ICD-10-CM

## 2019-01-12 DIAGNOSIS — C73 Malignant neoplasm of thyroid gland: Secondary | ICD-10-CM

## 2019-01-12 DIAGNOSIS — Q999 Chromosomal abnormality, unspecified: Secondary | ICD-10-CM

## 2019-01-12 DIAGNOSIS — D0511 Intraductal carcinoma in situ of right breast: Secondary | ICD-10-CM

## 2019-01-12 DIAGNOSIS — R945 Abnormal results of liver function studies: Secondary | ICD-10-CM

## 2019-01-12 DIAGNOSIS — Z85850 Personal history of malignant neoplasm of thyroid: Secondary | ICD-10-CM

## 2019-01-25 ENCOUNTER — Encounter: Payer: Self-pay | Admitting: Family Medicine

## 2019-01-26 ENCOUNTER — Encounter: Payer: Self-pay | Admitting: Gastroenterology

## 2019-01-26 ENCOUNTER — Ambulatory Visit (HOSPITAL_COMMUNITY)
Admission: RE | Admit: 2019-01-26 | Discharge: 2019-01-26 | Disposition: A | Discharge status: Home / Self Care | Payer: BC Managed Care – PPO | Source: Ambulatory Visit | Attending: Oncology | Admitting: Oncology

## 2019-01-26 ENCOUNTER — Other Ambulatory Visit: Payer: Self-pay | Admitting: Oncology

## 2019-01-26 ENCOUNTER — Other Ambulatory Visit: Payer: Self-pay | Admitting: Family Medicine

## 2019-01-26 ENCOUNTER — Other Ambulatory Visit: Payer: Self-pay

## 2019-01-26 DIAGNOSIS — Z1501 Genetic susceptibility to malignant neoplasm of breast: Secondary | ICD-10-CM

## 2019-01-26 DIAGNOSIS — K8020 Calculus of gallbladder without cholecystitis without obstruction: Secondary | ICD-10-CM | POA: Diagnosis not present

## 2019-01-26 DIAGNOSIS — Z1211 Encounter for screening for malignant neoplasm of colon: Secondary | ICD-10-CM

## 2019-01-26 DIAGNOSIS — Q999 Chromosomal abnormality, unspecified: Secondary | ICD-10-CM | POA: Diagnosis not present

## 2019-01-26 DIAGNOSIS — Z1509 Genetic susceptibility to other malignant neoplasm: Secondary | ICD-10-CM | POA: Insufficient documentation

## 2019-01-26 DIAGNOSIS — C73 Malignant neoplasm of thyroid gland: Secondary | ICD-10-CM | POA: Diagnosis not present

## 2019-01-26 DIAGNOSIS — D0511 Intraductal carcinoma in situ of right breast: Secondary | ICD-10-CM | POA: Diagnosis not present

## 2019-01-26 DIAGNOSIS — R7989 Other specified abnormal findings of blood chemistry: Secondary | ICD-10-CM

## 2019-01-26 DIAGNOSIS — Z85850 Personal history of malignant neoplasm of thyroid: Secondary | ICD-10-CM | POA: Diagnosis not present

## 2019-01-26 DIAGNOSIS — R945 Abnormal results of liver function studies: Secondary | ICD-10-CM | POA: Diagnosis not present

## 2019-01-26 MED ORDER — GADOBUTROL 1 MMOL/ML IV SOLN
10.0000 mL | Freq: Once | INTRAVENOUS | Status: AC | PRN
  Administered 2019-01-26: 10 mL via INTRAVENOUS

## 2019-01-27 ENCOUNTER — Encounter: Payer: Self-pay | Admitting: Oncology

## 2019-02-02 NOTE — Telephone Encounter (Signed)
No entry 

## 2019-02-03 ENCOUNTER — Other Ambulatory Visit: Payer: Self-pay

## 2019-02-03 ENCOUNTER — Ambulatory Visit (AMBULATORY_SURGERY_CENTER): Payer: Self-pay | Admitting: *Deleted

## 2019-02-03 VITALS — Temp 97.3°F | Ht 68.0 in | Wt 200.0 lb

## 2019-02-03 DIAGNOSIS — Z1211 Encounter for screening for malignant neoplasm of colon: Secondary | ICD-10-CM

## 2019-02-03 DIAGNOSIS — Z01818 Encounter for other preprocedural examination: Secondary | ICD-10-CM

## 2019-02-03 MED ORDER — SUPREP BOWEL PREP KIT 17.5-3.13-1.6 GM/177ML PO SOLN: 1.0000 | Freq: Once | ORAL | 0 refills | Status: AC

## 2019-02-03 NOTE — Progress Notes (Signed)
No egg or soy allergy known to patient   issues with past sedation with any surgeries  or procedures of PONV , no intubation problems  No diet pills per patient No home 02 use per patient  No blood thinners per patient  Pt denies issues with constipation  No A fib or A flutter  EMMI video sent to pt's e mail  Suprep $15   Due to the COVID-19 pandemic we are asking patients to follow these guidelines. Please only bring one care partner. Please be aware that your care partner may wait in the car in the parking lot or if they feel like they will be too hot to wait in the car, they may wait in the lobby on the 4th floor. All care partners are required to wear a mask the entire time (we do not have any that we can provide them), they need to practice social distancing, and we will do a Covid check for all patient's and care partners when you arrive. Also we will check their temperature and your temperature. If the care partner waits in their car they need to stay in the parking lot the entire time and we will call them on their cell phone when the patient is ready for discharge so they can bring the car to the front of the building. Also all patient's will need to wear a mask into building.

## 2019-02-09 ENCOUNTER — Encounter: Payer: Self-pay | Admitting: Gastroenterology

## 2019-02-11 ENCOUNTER — Other Ambulatory Visit: Payer: Self-pay

## 2019-02-11 ENCOUNTER — Ambulatory Visit (INDEPENDENT_AMBULATORY_CARE_PROVIDER_SITE_OTHER): Payer: Self-pay

## 2019-02-11 ENCOUNTER — Other Ambulatory Visit: Payer: Self-pay | Admitting: Gastroenterology

## 2019-02-11 DIAGNOSIS — Z1159 Encounter for screening for other viral diseases: Secondary | ICD-10-CM

## 2019-02-11 LAB — SARS CORONAVIRUS 2 (TAT 6-24 HRS): SARS Coronavirus 2: NEGATIVE

## 2019-02-16 ENCOUNTER — Encounter: Payer: Self-pay | Admitting: Gastroenterology

## 2019-02-16 ENCOUNTER — Other Ambulatory Visit: Payer: Self-pay

## 2019-02-16 ENCOUNTER — Ambulatory Visit (AMBULATORY_SURGERY_CENTER): Payer: BC Managed Care – PPO | Admitting: Gastroenterology

## 2019-02-16 VITALS — BP 143/83 | HR 73 | Temp 95.1°F | Resp 16 | Ht 68.0 in | Wt 200.0 lb

## 2019-02-16 DIAGNOSIS — D123 Benign neoplasm of transverse colon: Secondary | ICD-10-CM

## 2019-02-16 DIAGNOSIS — D124 Benign neoplasm of descending colon: Secondary | ICD-10-CM | POA: Diagnosis not present

## 2019-02-16 DIAGNOSIS — Z1211 Encounter for screening for malignant neoplasm of colon: Secondary | ICD-10-CM

## 2019-02-16 DIAGNOSIS — D122 Benign neoplasm of ascending colon: Secondary | ICD-10-CM | POA: Diagnosis not present

## 2019-02-16 DIAGNOSIS — D120 Benign neoplasm of cecum: Secondary | ICD-10-CM | POA: Diagnosis not present

## 2019-02-16 MED ORDER — SODIUM CHLORIDE 0.9 % IV SOLN: 500.0000 mL | Freq: Once | INTRAVENOUS | Status: DC

## 2019-02-16 NOTE — Progress Notes (Signed)
Report to PACU, RN, vss, BBS= Clear.  

## 2019-02-16 NOTE — Progress Notes (Signed)
Temp check by:JB Vital check by:KA  The patient states no changes in medical or surgical history since pre-visit screening on 02/03/19.

## 2019-02-16 NOTE — Patient Instructions (Signed)
Please read handouts provided. Continue present medications. Await pathology results. No aspirin, ibuprofen, naproxen, or other non-steriodal anti-inflammatory drugs for 2 weeks.       YOU HAD AN ENDOSCOPIC PROCEDURE TODAY AT [facility]:   Refer to the procedure report that was given to you for any specific questions about what was found during the examination.  If the procedure report does not answer your questions, please call your gastroenterologist to clarify.  If you requested that your care partner not be given the details of your procedure findings, then the procedure report has been included in a sealed envelope for you to review at your convenience later.  YOU SHOULD EXPECT: Some feelings of bloating in the abdomen. Passage of more gas than usual.  Walking can help get rid of the air that was put into your GI tract during the procedure and reduce the bloating. If you had a lower endoscopy (such as a colonoscopy or flexible sigmoidoscopy) you may notice spotting of blood in your stool or on the toilet paper. If you underwent a bowel prep for your procedure, you may not have a normal bowel movement for a few days.  Please Note:  You might notice some irritation and congestion in your nose or some drainage.  This is from the oxygen used during your procedure.  There is no need for concern and it should clear up in a day or so.  SYMPTOMS TO REPORT IMMEDIATELY:   Following lower endoscopy (colonoscopy or flexible sigmoidoscopy):  Excessive amounts of blood in the stool  Significant tenderness or worsening of abdominal pains  Swelling of the abdomen that is new, acute  Fever of 100F or higher   For urgent or emergent issues, a gastroenterologist can be reached at any hour by calling [phone].   DIET:  We do recommend a small meal at first, but then you may proceed to your regular diet.  Drink plenty of fluids but you should avoid alcoholic beverages for 24  hours.  ACTIVITY:  You should plan to take it easy for the rest of today and you should NOT DRIVE or use heavy machinery until tomorrow (because of the sedation medicines used during the test).    FOLLOW UP: Our staff will call the number listed on your records 48-72 hours following your procedure to check on you and address any questions or concerns that you may have regarding the information given to you following your procedure. If we do not reach you, we will leave a message.  We will attempt to reach you two times.  During this call, we will ask if you have developed any symptoms of COVID 19. If you develop any symptoms (ie: fever, flu-like symptoms, shortness of breath, cough etc.) before then, please call [phone].  If you test positive for Covid 19 in the 2 weeks post procedure, please call and report this information to [place].    If any biopsies were taken you will be contacted by phone or by letter within the next 1-3 weeks.  Please call us at [phone] if you have not heard about the biopsies in 3 weeks.    SIGNATURES/CONFIDENTIALITY: You and/or your care partner have signed paperwork which will be entered into your electronic medical record.  These signatures attest to the fact that that the information above on your After Visit Summary has been reviewed and is understood.  Full responsibility of the confidentiality of this discharge information lies with you and/or your  care-partner.

## 2019-02-16 NOTE — Op Note (Signed)
[place] Patient Name: [name] Procedure Date: 02/16/2019 8:18 AM MRN: [id number] Endoscopist: Ladene Artist , MD Age: 49 Referring MD:  Date of Birth: 10/03/1970 Gender: Female Account #: [id number] Procedure:                Colonoscopy Indications:              Screening for colorectal malignant neoplasm Medicines:                Monitored Anesthesia Care Procedure:                Pre-Anesthesia Assessment:                           - Prior to the procedure, a History and Physical                            was performed, and patient medications and                            allergies were reviewed. The patient's tolerance of                            previous anesthesia was also reviewed. The risks                            and benefits of the procedure and the sedation                            options and risks were discussed with the patient.                            All questions were answered, and informed consent                            was obtained. Prior Anticoagulants: The patient has                            taken no previous anticoagulant or antiplatelet                            agents. ASA Grade Assessment: II - A patient with                            mild systemic disease. After reviewing the risks                            and benefits, the patient was deemed in                            satisfactory condition to undergo the procedure.                           After obtaining informed consent, the colonoscope  was passed under direct vision. Throughout the                            procedure, the patient's blood pressure, pulse, and                            oxygen saturations were monitored continuously. The                            Colonoscope was introduced through the anus and                            advanced to the the cecum, identified by                            appendiceal orifice and  ileocecal valve. The                            ileocecal valve, appendiceal orifice, and rectum                            were photographed. The quality of the bowel                            preparation was excellent. The colonoscopy was                            performed without difficulty. The patient tolerated                            the procedure well. Scope In: 8:29:12 AM Scope Out: 8:49:51 AM Scope Withdrawal Time: 0 hours 16 minutes 33 seconds  Total Procedure Duration: 0 hours 20 minutes 39 seconds  Findings:                 The perianal and digital rectal examinations were                            normal.                           A 12 mm polyp was found in the cecum. The polyp was                            sessile. The polyp was removed with a hot snare.                            Resection and retrieval were complete.                           Two sessile polyps were found in the transverse                            colon and cecum. The polyps were 4 to 7 mm in size.  These polyps were removed with a cold snare.                            Resection and retrieval were complete.                           A 4 mm polyp was found in the ascending colon. The                            polyp was sessile. The polyp was removed with a                            cold biopsy forceps. Resection and retrieval were                            complete.                           A 8 mm polyp was found in the descending colon. The                            polyp was semi-pedunculated. The polyp was removed                            with a hot snare. Resection and retrieval were                            complete.                           The exam was otherwise without abnormality on                            direct and retroflexion views. Complications:            No immediate complications. Estimated blood loss:                             None. Estimated Blood Loss:     Estimated blood loss: none. Impression:               - One 12 mm polyp in the cecum, removed with a hot                            snare. Resected and retrieved.                           - Two 4 to 7 mm polyps in the transverse colon and                            in the cecum, removed with a cold snare. Resected                            and retrieved.                           -  One 4 mm polyp in the ascending colon, removed                            with a cold biopsy forceps. Resected and retrieved.                           - One 8 mm polyp in the descending colon, removed                            with a hot snare. Resected and retrieved.                           - The examination was otherwise normal on direct                            and retroflexion views. Recommendation:           - Repeat colonoscopy (likely in 3 years) after                            studies are complete for surveillance based on                            pathology results.                           - Patient has a contact number available for                            emergencies. The signs and symptoms of potential                            delayed complications were discussed with the                            patient. Return to normal activities tomorrow.                            Written discharge instructions were provided to the                            patient.                           - Resume previous diet.                           - Continue present medications.                           - Await pathology results.                           - No aspirin, ibuprofen, naproxen, or other  non-steroidal anti-inflammatory drugs for 2 weeks                            after polyp removal. Ladene Artist, MD 02/16/2019 8:54:43 AM This report has been signed electronically.

## 2019-02-16 NOTE — Progress Notes (Signed)
Called to room to assist during endoscopic procedure.  Patient ID and intended procedure confirmed with present staff. Received instructions for my participation in the procedure from the performing physician.  

## 2019-02-18 ENCOUNTER — Encounter: Payer: Self-pay | Admitting: Gastroenterology

## 2019-02-18 ENCOUNTER — Telehealth: Payer: Self-pay

## 2019-02-18 NOTE — Telephone Encounter (Signed)
  Follow up Call-  Call back number 02/16/2019  Post procedure Call Back phone  # [phone]  Permission to leave phone message Yes  Some recent data might be hidden     Patient questions:  Do you have a fever, pain , or abdominal swelling? No. Pain Score  0 *  Have you tolerated food without any problems? Yes.    Have you been able to return to your normal activities? Yes.    Do you have any questions about your discharge instructions: Diet   No. Medications  No. Follow up visit  No.  Do you have questions or concerns about your Care? No.  Actions: * If pain score is 4 or above: No action needed, pain <4. 1. Have you developed a fever since your procedure? no  2.   Have you had an respiratory symptoms (SOB or cough) since your procedure? no  3.   Have you tested positive for COVID 19 since your procedure no  4.   Have you had any family members/close contacts diagnosed with the COVID 19 since your procedure?  no   If yes to any of these questions please route to Joylene John, RN and Alphonsa Gin, Therapist, sports.

## 2019-02-23 NOTE — Progress Notes (Signed)
[place] [facility]  [phone] [phone]     ID: [name] DOB: 07-06-1970  MR#: [id number]  [id number]  Patient Care Team: Shelda Pal, DO as PCP - General (Family Medicine) Sherren Mocha, MD as PCP - Cardiology (Cardiology) Kathyrn Lass, MD (Family Medicine) Rolm Bookbinder, MD as Consulting Physician (General Surgery) Irene Limbo, MD as Consulting Physician (Plastic Surgery) Sherren Mocha, MD as Consulting Physician (Cardiology) [place], Jennefer Bravo, MD as Referring Physician (Dermatology) Jacinto Reap, MD (Plastic Surgery) Jola Schmidt, MD as Consulting Physician (Ophthalmology) OTHER MD:  CHIEF COMPLAINT: 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   The patient continues screening for melanoma and pancreatic cancer in the setting of CDKN2A mutation. She sees her dermatologist, Dr. Renda Rolls, for melanoma screening, most recently in November  Since her last visit, she underwent routine abdomen MRI (with MRCP) on 01/26/2019 showing: no liver lesion; no evidence of hepatoma; normal pancreas; cholelithiasis without evidence of cholecystitis.  She also underwent colonoscopy on 02/16/2019 under Dr. Fuller Plan. Pathology from the procedure ([id number]) revealed: three polyps showing tubular adenoma without high grade dysplasia; two polyps without cytologic dysplasia. Repeat colonoscopy recommended in 3 years.  Of note, she also underwent pap smear on 07/02/2018, which was negative.  Her thyroid function tests are followed through her primary care physician  REVIEW OF SYSTEMS: Rickeya is generally doing quite well.  She is having some problems with her glasses currently and is planning to see a different  optometrist.  She is not exercising as regularly as she was before but she is "getting back into it".  She has some right shoulder and left hip discomfort, neither of which is new, but they are beginning to bother her a little bit more.  She is working mostly from home.  Her husband Aaron Edelman is still driving to work.  She and her family are taking appropriate pandemic precautions.  Aside from these issues a detailed review of systems was noncontributory   BREAST CANCER HISTORY: From the prior intake note:  Brailynn had bilateral screening mammography at a mobile clinic 11/16/2014. This showed a focal density in the lower inner quadrant of the right breast and she was recalled for diagnostic right mammography and right breast ultrasonography at the [facility] 11/28/2014. This showed the breast density to be category B. In the lower inner right breast there was a group of amorphous calcifications spanning approximately 3 cm. These were new as compared to mammography from 2014. Ultrasound of the area showed an ill-defined region of hypoechoic tissue but no definite mass.  Biopsy of the right breast mass in question 12/05/2014 showed ([id number]) ductal carcinoma in situ, grade 1, arising within an intraductal papilloma. The cells were estrogen receptor 9200% positive, progesterone receptor 80-90% positive, both with strong staining intensity.  The patient's subsequent history is as detailed below.   PAST MEDICAL HISTORY: Past Medical History:  Diagnosis Date  . Allergy   . Arthritis   . Blood transfusion without reported diagnosis    breast cancer 2016  . Breast cancer [facility]) 12/2014   Seeing [facility] doctors  . CAD (coronary artery disease)    open heart surgery 09/12/10  . Cancer [facility]) 2007   thyroid  . GERD (gastroesophageal reflux disease)   . Heart disease   . History of thyroid  cancer   . Hypothyroid   . Overweight(278.02)   . PONV (postoperative nausea and vomiting)      PAST SURGICAL HISTORY: Past Surgical History:  Procedure Laterality Date  . arm surgery     rt  . BILATERAL TOTAL MASTECTOMY WITH AXILLARY LYMPH NODE DISSECTION    . coronary artery bypass grafting x1  09/13/10   [place] [facility]  . HERNIA REPAIR  1977   inguinal, ?side  . KNEE ARTHROSCOPY Right 09/13/2015   pt reported  . THYROIDECTOMY    . ULNAR NERVE REPAIR Right 1989, 2002    FAMILY HISTORY Family History  Problem Relation Age of Onset  . Hypertension Mother   . Colon polyps Mother   . Arthritis Mother   . Cancer Mother        breast  . Breast cancer Mother   . Hypertension Father   . Hiatal hernia Father   . Hypertension Sister   . Lupus Maternal Aunt   . Heart disease Maternal Aunt   . Diabetes Maternal Aunt   . Arthritis Paternal Grandmother   . Diabetes Paternal Grandmother   . Parkinson's disease Paternal Grandmother   . Coronary artery disease Maternal Grandfather   . Cancer Maternal Aunt        breast, ovarian  . Breast cancer Maternal Aunt   . Diabetes Maternal Aunt   . Other Maternal Aunt        ?auto immune disease  . Colon cancer Maternal Uncle   . Esophageal cancer Neg Hx   . Rectal cancer Neg Hx   . Stomach cancer Neg Hx    The patient's parents are in their late 36s as of December 2016. The patient's mother was diagnosed with breast cancer age 78; on the maternal side, the maternal grandmother was diagnosed with uterine cancer at age 37. 2 maternal aunts had one breast cancer and one uterine cancer and one maternal uncle had colon cancer is were in their 40s and 4s. On the father's side there is an uncle with colon cancer age 64. There is no history of ovarian cancer to the patient's knowledge   GYNECOLOGIC HISTORY:  No LMP recorded. (Menstrual status: IUD). Menarche age 64, first live birth age 89. The patient is GX P2. She still having regular periods. She has a Mirena IUD in place.   SOCIAL HISTORY: (Updated February 2021) Jack works as a  Dealer. Her husband [name] is an Brewing technologist. Their son, [name], graduated from [employer] in [place] and is currently living at home working, as a Primary school teacher (a Emergency planning/management officer). Their daughter, Ander Purpura is currently managing a physical therapy office.  She is still interested in at some point possibly becoming a physical therapist.  She is also engaged.  ADVANCED DIRECTIVES: In the absence of any documentation to the contrary, the patient's spouse is their HCPOA.    HEALTH MAINTENANCE: Social History   Tobacco Use  . Smoking status: Never Smoker  . Smokeless tobacco: Never Used  Substance Use Topics  . Alcohol use: Yes    Comment: rarely  . Drug use: No     Colonoscopy: 02/2019 (Dr. Fuller Plan)  PAP: 06/2018, negative  Bone density: none on file   Lipid panel:  No Known Allergies  Current Outpatient Medications  Medication Sig Dispense Refill  . diphenhydrAMINE (BENADRYL) 25 mg capsule Take 25 mg by mouth as needed.     Marland Kitchen levonorgestrel (MIRENA) 20 MCG/24HR IUD 1 each by Intrauterine  route once.      Marland Kitchen levothyroxine (SYNTHROID) 150 MCG tablet Take 1 tablet (150 mcg total) by mouth every morning. 90 tablet 3  . meloxicam (MOBIC) 15 MG tablet Take 15 mg by mouth daily.    Marland Kitchen omeprazole (PRILOSEC) 10 MG capsule Take 1 capsule (10 mg total) by mouth 2 (two) times daily. (Patient taking differently: Take 10 mg by mouth daily. ) 180 capsule 1   No current facility-administered medications for this visit.    OBJECTIVE: Young appearing white woman in no acute distress Vitals:   02/24/19 1012  BP: 135/88  Pulse: 83  Resp: 18  Temp: 98 F (36.7 C)  SpO2: 99%     Body mass index is 30.85 kg/m.    ECOG FS:1 - Symptomatic but completely ambulatory  Sclerae unicteric, EOMs intact Wearing a mask No cervical or supraclavicular adenopathy Lungs no rales or rhonchi Heart regular rate and rhythm Abd soft, nontender, positive bowel sounds MSK no focal  spinal tenderness, no upper extremity lymphedema Neuro: nonfocal, well oriented, appropriate affect Breasts: Status post bilateral mastectomies with D IEP reconstruction.  There are no findings of concern.  Both axillae are benign.  LAB RESULTS:  CMP     Component Value Date/Time   NA 138 10/20/2018 0726   NA 139 10/21/2016 0846   K 3.7 10/20/2018 0726   K 4.2 10/21/2016 0846   CL 107 10/20/2018 0726   CO2 26 10/20/2018 0726   CO2 24 10/21/2016 0846   GLUCOSE 89 10/20/2018 0726   GLUCOSE 91 10/21/2016 0846   BUN 13 10/20/2018 0726   BUN 10.1 10/21/2016 0846   CREATININE 0.98 10/20/2018 0726   CREATININE 0.89 12/08/2017 0902   CREATININE 1.0 10/21/2016 0846   CALCIUM 9.3 10/20/2018 0726   CALCIUM 9.1 10/21/2016 0846   PROT 6.2 10/20/2018 0726   PROT 6.6 10/21/2016 0846   ALBUMIN 3.8 10/20/2018 0726   ALBUMIN 3.7 10/21/2016 0846   AST 21 10/20/2018 0726   AST 23 12/08/2017 0902   AST 28 10/21/2016 0846   ALT 23 10/20/2018 0726   ALT 19 12/08/2017 0902   ALT 28 10/21/2016 0846   ALKPHOS 88 10/20/2018 0726   ALKPHOS 100 10/21/2016 0846   BILITOT 1.2 10/20/2018 0726   BILITOT 1.9 (H) 12/08/2017 0902   BILITOT 1.18 10/21/2016 0846   GFRNONAA >60 12/08/2017 0902   GFRAA >60 12/08/2017 0902    INo results found for: SPEP, UPEP  Lab Results  Component Value Date   WBC 5.7 10/20/2018   NEUTROABS 5.1 12/08/2017   HGB 12.7 10/20/2018   HCT 36.8 10/20/2018   MCV 85.9 10/20/2018   PLT 234.0 10/20/2018      Chemistry      Component Value Date/Time   NA 138 10/20/2018 0726   NA 139 10/21/2016 0846   K 3.7 10/20/2018 0726   K 4.2 10/21/2016 0846   CL 107 10/20/2018 0726   CO2 26 10/20/2018 0726   CO2 24 10/21/2016 0846   BUN 13 10/20/2018 0726   BUN 10.1 10/21/2016 0846   CREATININE 0.98 10/20/2018 0726   CREATININE 0.89 12/08/2017 0902   CREATININE 1.0 10/21/2016 0846      Component Value Date/Time   CALCIUM 9.3 10/20/2018 0726   CALCIUM 9.1 10/21/2016 0846    ALKPHOS 88 10/20/2018 0726   ALKPHOS 100 10/21/2016 0846   AST 21 10/20/2018 0726   AST 23 12/08/2017 0902   AST 28 10/21/2016 0846   ALT  23 10/20/2018 0726   ALT 19 12/08/2017 0902   ALT 28 10/21/2016 0846   BILITOT 1.2 10/20/2018 0726   BILITOT 1.9 (H) 12/08/2017 0902   BILITOT 1.18 10/21/2016 0846       No results found for: LABCA2  No components found for: LABCA125  No results for input(s): INR in the last 168 hours.  Urinalysis    Component Value Date/Time   COLORURINE DARK YELLOW 02/20/2015 0705   APPEARANCEUR TURBID (A) 02/20/2015 0705   LABSPEC 1.023 02/20/2015 0705   PHURINE 5.5 02/20/2015 0705   GLUCOSEU NEGATIVE 02/20/2015 0705   GLUCOSEU NEGATIVE 01/30/2015 1428   HGBUR NEGATIVE 02/20/2015 0705   BILIRUBINUR NEGATIVE 02/20/2015 0705   KETONESUR NEGATIVE 02/20/2015 0705   PROTEINUR NEGATIVE 02/20/2015 0705   UROBILINOGEN 0.2 01/30/2015 1428   NITRITE NEGATIVE 02/20/2015 0705   LEUKOCYTESUR NEGATIVE 02/20/2015 0705    STUDIES: MR 3D Recon At Scanner  Result Date: 01/26/2019 CLINICAL DATA:  History of multiple endocrine neoplasm type 1. Genetic risk for hepatocellular carcinoma. Additional history thyroid cancer breast cancer. Elevated LFTs. EXAM: MRI ABDOMEN WITHOUT AND WITH CONTRAST (INCLUDING MRCP) TECHNIQUE: Multiplanar multisequence MR imaging of the abdomen was performed both before and after the administration of intravenous contrast. Heavily T2-weighted images of the biliary and pancreatic ducts were obtained, and three-dimensional MRCP images were rendered by post processing. CONTRAST:  17mL GADAVIST GADOBUTROL 1 MMOL/ML IV SOLN COMPARISON:  MRI 12/14/2017 FINDINGS: Lower chest:  Lung bases are clear. Hepatobiliary: No focal hepatic lesion the noncontrast series. No enhancing lesion on the postcontrast T1 weighted imaging. No hepatic steatosis. No biliary duct dilatation. Several small gallstones noted towards the fundus of the gallbladder. No  gallbladder inflammation common bile duct normal. Pancreas: Normal pancreatic parenchyma. Abnormal enhancement. No ductal dilatation. No inflammation Spleen: Normal spleen. Adrenals/urinary tract: Adrenal glands and kidneys are normal. Stomach/Bowel: Stomach and limited of the small bowel is unremarkable Vascular/Lymphatic: Abdominal aortic normal caliber. No retroperitoneal periportal lymphadenopathy. Musculoskeletal: No aggressive osseous lesion. Round lesion in the L3 vertebral body which is hyperintense on T2 weighted imaging (image 24/7). Lesion unchanged from prior and favored a small benign hemangioma. IMPRESSION: 1. No liver lesion.  No evidence of hepatoma. 2. Normal pancreas. 3. Cholelithiasis without evidence of cholecystitis. Electronically Signed   By: Suzy Bouchard M.D.   On: 01/26/2019 09:54   MR ABDOMEN MRCP W WO CONTAST  Result Date: 01/26/2019 CLINICAL DATA:  History of multiple endocrine neoplasm type 1. Genetic risk for hepatocellular carcinoma. Additional history thyroid cancer breast cancer. Elevated LFTs. EXAM: MRI ABDOMEN WITHOUT AND WITH CONTRAST (INCLUDING MRCP) TECHNIQUE: Multiplanar multisequence MR imaging of the abdomen was performed both before and after the administration of intravenous contrast. Heavily T2-weighted images of the biliary and pancreatic ducts were obtained, and three-dimensional MRCP images were rendered by post processing. CONTRAST:  11mL GADAVIST GADOBUTROL 1 MMOL/ML IV SOLN COMPARISON:  MRI 12/14/2017 FINDINGS: Lower chest:  Lung bases are clear. Hepatobiliary: No focal hepatic lesion the noncontrast series. No enhancing lesion on the postcontrast T1 weighted imaging. No hepatic steatosis. No biliary duct dilatation. Several small gallstones noted towards the fundus of the gallbladder. No gallbladder inflammation common bile duct normal. Pancreas: Normal pancreatic parenchyma. Abnormal enhancement. No ductal dilatation. No inflammation Spleen: Normal spleen.  Adrenals/urinary tract: Adrenal glands and kidneys are normal. Stomach/Bowel: Stomach and limited of the small bowel is unremarkable Vascular/Lymphatic: Abdominal aortic normal caliber. No retroperitoneal periportal lymphadenopathy. Musculoskeletal: No aggressive osseous lesion. Round lesion in the L3 vertebral  body which is hyperintense on T2 weighted imaging (image 24/7). Lesion unchanged from prior and favored a small benign hemangioma. IMPRESSION: 1. No liver lesion.  No evidence of hepatoma. 2. Normal pancreas. 3. Cholelithiasis without evidence of cholecystitis. Electronically Signed   By: Suzy Bouchard M.D.   On: 01/26/2019 09:54    ASSESSMENT: 49 y.o. [place] woman status post right breast lower inner quadrant biopsy 12/05/2014 for ductal carcinoma in situ arising from an intraductal papilloma, estrogen and progesterone receptor positive  (1) genetics testing at [facility] obtained December 2016 showed a deleterious mutation in CDKN2A (specifically c.9_32dup [Ala3 Pro11dup]).  The patient does not carry theCHEK2  Deletion present in her mother.  (a)  Patients who carry a  CDKN2A mutation are at high risk of melanoma  (28-76% lifetime risk) and pancreatic cancer (17% lifetime risk).  (2)  Status post bilateral mastectomies with immediate DIEP reconstruction 02/24//2017 showing  (a)  On the left side, benign breast tissue  (b)  On the right side, pTis pN0, stage 0 DCIS  measuring, 2.8 cm, estrogen receptor and progesterone receptor positive,   (3)  Screening for melanoma with biannual skin exam is recommended ([place])   (4) screening for pancreatic cancer will consist of by annual MRI/MRCP  (a) abdominal MRI 04/12/2017shows only cholelithiasis and mild hepatomegaly  (b) abdominal MRI/MRCP 10/17/2015 showed normal pancreas, mild hepatomegaly, and cholelithiasis  (c) repeat abdominal MRCP is January 2019 and December 2019 show no pancreatic lesion  (5) Mirena IUD in  place   PLAN:  Kaydan is now 4 years out from definitive surgery for her breast cancer with no evidence of disease recurrence.  She understands she has an excellent prognosis and is almost certainly cured.  We continue screening for her genetics concerns and she will have a repeat MRI/MRCP of the abdomen in December.  Her next colonoscopy will be in 2024.  She continues to have skin exam twice a year.  Her thyroid functions are followed through her primary care physician.  At this point I am delighted at how well she is doing.  I have encouraged her to pick up again on her usually excellent exercise program.  She knows to call for any other issue that may develop before the next visit  Total encounter time 20 minutes.*  Iria Jamerson, Virgie Dad, MD  02/24/19 10:45 AM Medical Oncology and Hematology [facility] [place], [place] [address] Tel. [phone]    Fax. [phone]   I, Wilburn Mylar, am acting as scribe for Dr. Virgie Dad. Katherin Ramey.  I, Lurline Del MD, have reviewed the above documentation for accuracy and completeness, and I agree with the above.    *Total Encounter Time as defined by the Centers for Medicare and Medicaid Services includes, in addition to the face-to-face time of a patient visit (documented in the note above) non-face-to-face time: obtaining and reviewing outside history, ordering and reviewing medications, tests or procedures, care coordination (communications with other health care professionals or caregivers) and documentation in the medical record.

## 2019-02-24 ENCOUNTER — Inpatient Hospital Stay: Payer: BC Managed Care – PPO | Attending: Oncology | Admitting: Oncology

## 2019-02-24 ENCOUNTER — Other Ambulatory Visit: Payer: Self-pay

## 2019-02-24 VITALS — BP 135/88 | HR 83 | Temp 98.0°F | Resp 18 | Ht 68.0 in | Wt 202.9 lb

## 2019-02-24 DIAGNOSIS — Q999 Chromosomal abnormality, unspecified: Secondary | ICD-10-CM

## 2019-02-24 DIAGNOSIS — Z818 Family history of other mental and behavioral disorders: Secondary | ICD-10-CM | POA: Insufficient documentation

## 2019-02-24 DIAGNOSIS — Z8249 Family history of ischemic heart disease and other diseases of the circulatory system: Secondary | ICD-10-CM | POA: Insufficient documentation

## 2019-02-24 DIAGNOSIS — Z800 Family history of malignant neoplasm of digestive organs: Secondary | ICD-10-CM | POA: Diagnosis not present

## 2019-02-24 DIAGNOSIS — M1990 Unspecified osteoarthritis, unspecified site: Secondary | ICD-10-CM | POA: Insufficient documentation

## 2019-02-24 DIAGNOSIS — Z1509 Genetic susceptibility to other malignant neoplasm: Secondary | ICD-10-CM | POA: Diagnosis not present

## 2019-02-24 DIAGNOSIS — Z85850 Personal history of malignant neoplasm of thyroid: Secondary | ICD-10-CM | POA: Insufficient documentation

## 2019-02-24 DIAGNOSIS — Z9013 Acquired absence of bilateral breasts and nipples: Secondary | ICD-10-CM | POA: Insufficient documentation

## 2019-02-24 DIAGNOSIS — Z79899 Other long term (current) drug therapy: Secondary | ICD-10-CM | POA: Insufficient documentation

## 2019-02-24 DIAGNOSIS — Z832 Family history of diseases of the blood and blood-forming organs and certain disorders involving the immune mechanism: Secondary | ICD-10-CM | POA: Diagnosis not present

## 2019-02-24 DIAGNOSIS — Z793 Long term (current) use of hormonal contraceptives: Secondary | ICD-10-CM | POA: Insufficient documentation

## 2019-02-24 DIAGNOSIS — Z170 Estrogen receptor positive status [ER+]: Secondary | ICD-10-CM | POA: Insufficient documentation

## 2019-02-24 DIAGNOSIS — Z833 Family history of diabetes mellitus: Secondary | ICD-10-CM | POA: Diagnosis not present

## 2019-02-24 DIAGNOSIS — C73 Malignant neoplasm of thyroid gland: Secondary | ICD-10-CM

## 2019-02-24 DIAGNOSIS — E039 Hypothyroidism, unspecified: Secondary | ICD-10-CM | POA: Insufficient documentation

## 2019-02-24 DIAGNOSIS — I2510 Atherosclerotic heart disease of native coronary artery without angina pectoris: Secondary | ICD-10-CM | POA: Diagnosis not present

## 2019-02-24 DIAGNOSIS — K219 Gastro-esophageal reflux disease without esophagitis: Secondary | ICD-10-CM | POA: Diagnosis not present

## 2019-02-24 DIAGNOSIS — R7989 Other specified abnormal findings of blood chemistry: Secondary | ICD-10-CM | POA: Insufficient documentation

## 2019-02-24 DIAGNOSIS — D0511 Intraductal carcinoma in situ of right breast: Secondary | ICD-10-CM | POA: Insufficient documentation

## 2019-02-24 DIAGNOSIS — K8020 Calculus of gallbladder without cholecystitis without obstruction: Secondary | ICD-10-CM | POA: Diagnosis not present

## 2019-02-24 DIAGNOSIS — Z8371 Family history of colonic polyps: Secondary | ICD-10-CM | POA: Insufficient documentation

## 2019-02-24 DIAGNOSIS — Z1501 Genetic susceptibility to malignant neoplasm of breast: Secondary | ICD-10-CM

## 2019-02-24 DIAGNOSIS — Z803 Family history of malignant neoplasm of breast: Secondary | ICD-10-CM | POA: Diagnosis not present

## 2019-02-24 DIAGNOSIS — Z8261 Family history of arthritis: Secondary | ICD-10-CM | POA: Insufficient documentation

## 2019-02-24 DIAGNOSIS — G4733 Obstructive sleep apnea (adult) (pediatric): Secondary | ICD-10-CM

## 2019-02-25 ENCOUNTER — Telehealth: Payer: Self-pay | Admitting: Oncology

## 2019-02-25 NOTE — Telephone Encounter (Signed)
I left a message regarding schedule  

## 2019-03-09 ENCOUNTER — Ambulatory Visit: Payer: BC Managed Care – PPO | Admitting: Cardiovascular Disease

## 2019-03-10 DIAGNOSIS — Z713 Dietary counseling and surveillance: Secondary | ICD-10-CM | POA: Diagnosis not present

## 2019-03-31 ENCOUNTER — Ambulatory Visit: Payer: BC Managed Care – PPO | Attending: Internal Medicine

## 2019-03-31 DIAGNOSIS — Z23 Encounter for immunization: Secondary | ICD-10-CM

## 2019-03-31 NOTE — Progress Notes (Signed)
   Covid-19 Vaccination Clinic  Name:  CYDNIE WARMUTH    MRN: [id number] DOB: 01-05-1971  03/31/2019  Ms. Sorter was observed post Covid-19 immunization for 15 minutes without incident. She was provided with Vaccine Information Sheet and instruction to access the V-Safe system.   Ms. Carucci was instructed to call 911 with any severe reactions post vaccine: Marland Kitchen Difficulty breathing  . Swelling of face and throat  . A fast heartbeat  . A bad rash all over body  . Dizziness and weakness   Immunizations Administered    Name Date Dose VIS Date Route   Pfizer COVID-19 Vaccine 03/31/2019  8:24 AM 0.3 mL 12/24/2018 Intramuscular   Manufacturer: [place]   Lot: [id number]   [place]: [id number]

## 2019-04-13 DIAGNOSIS — M79672 Pain in left foot: Secondary | ICD-10-CM | POA: Diagnosis not present

## 2019-04-25 ENCOUNTER — Ambulatory Visit: Payer: BC Managed Care – PPO | Attending: Internal Medicine

## 2019-04-25 DIAGNOSIS — Z23 Encounter for immunization: Secondary | ICD-10-CM

## 2019-04-25 NOTE — Progress Notes (Signed)
   Covid-19 Vaccination Clinic  Name:  [name]    MRN: [id number] DOB: 1970/03/24  04/25/2019  [name] was observed post Covid-19 immunization for 15 minutes without incident. She was provided with Vaccine Information Sheet and instruction to access the V-Safe system.   [name] was instructed to call 911 with any severe reactions post vaccine: Marland Kitchen Difficulty breathing  . Swelling of face and throat  . A fast heartbeat  . A bad rash all over body  . Dizziness and weakness   Immunizations Administered    Name Date Dose VIS Date Route   Pfizer COVID-19 Vaccine 04/25/2019  8:23 AM 0.3 mL 12/24/2018 Intramuscular   Manufacturer: [place]   Lot: [id number]   [place]: [id number]

## 2019-05-23 ENCOUNTER — Encounter: Payer: Self-pay | Admitting: Family Medicine

## 2019-05-23 ENCOUNTER — Other Ambulatory Visit: Payer: Self-pay

## 2019-05-23 ENCOUNTER — Ambulatory Visit: Payer: Self-pay

## 2019-05-23 ENCOUNTER — Ambulatory Visit: Payer: BC Managed Care – PPO | Admitting: Family Medicine

## 2019-05-23 VITALS — BP 137/85 | HR 82 | Ht 68.0 in | Wt 200.0 lb

## 2019-05-23 DIAGNOSIS — G8929 Other chronic pain: Secondary | ICD-10-CM

## 2019-05-23 DIAGNOSIS — M79672 Pain in left foot: Secondary | ICD-10-CM

## 2019-05-23 MED ORDER — PREDNISONE 5 MG PO TABS: ORAL_TABLET | ORAL | 0 refills | Status: DC

## 2019-05-23 NOTE — Progress Notes (Signed)
[name] - 49 y.o. female MRN [id number]  Date of birth: 1970/12/16  SUBJECTIVE:  Including CC & ROS.  Chief Complaint  Patient presents with  . Foot Pain    left heel    [name] is a 49 y.o. female that is presenting with left heel pain.  The pain has been ongoing for 4 months.  She has pain over the lateral aspect of the calcaneus.  It is worse with rub on compression.  Denies any specific inciting event.  Has not increased her walking or exercise.  Is localized to this area.  No history of stress fracture.   Review of Systems See HPI   HISTORY: Past Medical, Surgical, Social, and Family History Reviewed & Updated per EMR.   Pertinent Historical Findings include:  Past Medical History:  Diagnosis Date  . Allergy   . Arthritis   . Blood transfusion without reported diagnosis    breast cancer 2016  . Breast cancer [facility]) 12/2014   Seeing [facility] doctors  . CAD (coronary artery disease)    open heart surgery 09/12/10  . Cancer [facility]) 2007   thyroid  . GERD (gastroesophageal reflux disease)   . Heart disease   . History of thyroid cancer   . Hypothyroid   . Overweight(278.02)   . PONV (postoperative nausea and vomiting)     Past Surgical History:  Procedure Laterality Date  . arm surgery     rt  . BILATERAL TOTAL MASTECTOMY WITH AXILLARY LYMPH NODE DISSECTION    . coronary artery bypass grafting x1  09/13/10   [place] [facility]  . HERNIA REPAIR  1977   inguinal, ?side  . KNEE ARTHROSCOPY Right 09/13/2015   pt reported  . THYROIDECTOMY    . ULNAR NERVE REPAIR Right 1989, 2002    Family History  Problem Relation Age of Onset  . Hypertension Mother   . Colon polyps Mother   . Arthritis Mother   . Cancer Mother        breast  . Breast cancer Mother   . Hypertension Father   . Hiatal hernia Father   . Hypertension Sister   . Lupus Maternal Aunt   . Heart disease Maternal Aunt   . Diabetes Maternal Aunt   . Arthritis Paternal Grandmother   .  Diabetes Paternal Grandmother   . Parkinson's disease Paternal Grandmother   . Coronary artery disease Maternal Grandfather   . Cancer Maternal Aunt        breast, ovarian  . Breast cancer Maternal Aunt   . Diabetes Maternal Aunt   . Other Maternal Aunt        ?auto immune disease  . Colon cancer Maternal Uncle   . Esophageal cancer Neg Hx   . Rectal cancer Neg Hx   . Stomach cancer Neg Hx     Social History   Socioeconomic History  . Marital status: Married    Spouse name: Not on file  . Number of children: 2  . Years of education: 24  . Highest education level: Not on file  Occupational History  . Occupation: Building control surveyor: [employer]  Tobacco Use  . Smoking status: Never Smoker  . Smokeless tobacco: Never Used  Substance and Sexual Activity  . Alcohol use: Yes    Comment: rarely  . Drug use: No  . Sexual activity: Yes    Birth control/protection: I.U.D.  Other Topics Concern  . Not on file  Social History Narrative   Regular exercise-yes   Caffeine Use-yes   Works as Patent examiner   [phone]- son   Married   Enjoys Marine scientist and a Scientist, research (physical sciences) degree   Social Determinants of Radio broadcast assistant Strain:   . Difficulty of Paying Living Expenses:   Food Insecurity:   . Worried About Charity fundraiser in the Last Year:   . Arboriculturist in the Last Year:   Transportation Needs:   . Film/video editor (Medical):   Marland Kitchen Lack of Transportation (Non-Medical):   Physical Activity:   . Days of Exercise per Week:   . Minutes of Exercise per Session:   Stress:   . Feeling of Stress :   Social Connections:   . Frequency of Communication with Friends and Family:   . Frequency of Social Gatherings with Friends and Family:   . Attends Religious Services:   . Active Member of Clubs or Organizations:   . Attends Archivist Meetings:   Marland Kitchen Marital Status:   Intimate Partner Violence:   . Fear  of Current or Ex-Partner:   . Emotionally Abused:   Marland Kitchen Physically Abused:   . Sexually Abused:      PHYSICAL EXAM:  VS: BP 137/85   Pulse 82   Ht 5\' 8"  (1.727 m)   Wt 200 lb (90.7 kg)   BMI 30.41 kg/m  Physical Exam Gen: NAD, alert, cooperative with exam, well-appearing MSK:  Left heel: No ecchymosis or swelling. No significant tenderness to palpation over the lateral aspect of the calcaneus. No tenderness palpation over the plantar aspect. Normal strength resistance  Neurovascular intact  Limited ultrasound: Left ankle/foot:  The peroneal tendons seem to have excessive motion at the lateral malleolus when compared to contralateral side. The tendons themselves to be structurally normal. No changes on the calcaneus itself. There is a calcaneal heel spur at the insertion of Achilles with no hyperemia associated with it. Normal insertion into the peroneal brevis   Summary: Findings would suggest possible dynamic function of the peroneal tendons contributing to the cause.  Ultrasound and interpretation by Clearance Coots, MD    ASSESSMENT & PLAN:   Chronic heel pain, left Pain is localized to the lateral aspect of the calcaneus.  Possible for fat pad syndrome.  Seems less likely as for Achilles association.  There is excessive motion of the peroneal tendons which could cause irritation down in that area in the form of nerve irritation.  Less likely for stress fracture. -Counseled on home exercise therapy and supportive care. -Could try gel heel cups. -Prednisone. -Provided sample of Pennsaid. -If no improvement can consider nerve block versus MRI

## 2019-05-23 NOTE — Assessment & Plan Note (Signed)
Pain is localized to the lateral aspect of the calcaneus.  Possible for fat pad syndrome.  Seems less likely as for Achilles association.  There is excessive motion of the peroneal tendons which could cause irritation down in that area in the form of nerve irritation.  Less likely for stress fracture. -Counseled on home exercise therapy and supportive care. -Could try gel heel cups. -Prednisone. -Provided sample of Pennsaid. -If no improvement can consider nerve block versus MRI

## 2019-05-23 NOTE — Progress Notes (Signed)
Medication Samples have been provided to the patient.  Drug name: Pennsaid       Strength: 2%        Qty: 1 Box  LOT: [id number]  Exp.Date: 01/2020  Dosing instructions: Use a pea size amount and rub gently.  The patient has been instructed regarding the correct time, dose, and frequency of taking this medication, including desired effects and most common side effects.   Sherrie George, MA 1:18 PM 05/23/2019

## 2019-05-23 NOTE — Patient Instructions (Signed)
Nice to meet you Please try the prednisone  Please try the pennsaid if needed  Please try the gel heel cups  Please try the exercises  Please send me a message in MyChart with any questions or updates.  Please see me back in 4 weeks.   --Dr. Raeford Razor

## 2019-06-01 DIAGNOSIS — D225 Melanocytic nevi of trunk: Secondary | ICD-10-CM | POA: Diagnosis not present

## 2019-06-01 DIAGNOSIS — D2261 Melanocytic nevi of right upper limb, including shoulder: Secondary | ICD-10-CM | POA: Diagnosis not present

## 2019-06-01 DIAGNOSIS — L814 Other melanin hyperpigmentation: Secondary | ICD-10-CM | POA: Diagnosis not present

## 2019-06-01 DIAGNOSIS — L821 Other seborrheic keratosis: Secondary | ICD-10-CM | POA: Diagnosis not present

## 2019-06-20 ENCOUNTER — Ambulatory Visit: Payer: BC Managed Care – PPO | Admitting: Family Medicine

## 2019-08-22 ENCOUNTER — Encounter: Payer: Self-pay | Admitting: Family Medicine

## 2019-08-22 ENCOUNTER — Other Ambulatory Visit: Payer: Self-pay | Admitting: Family Medicine

## 2019-08-22 ENCOUNTER — Other Ambulatory Visit: Payer: Self-pay

## 2019-08-22 ENCOUNTER — Ambulatory Visit: Payer: BC Managed Care – PPO | Admitting: Family Medicine

## 2019-08-22 VITALS — BP 110/80 | HR 100 | Temp 98.0°F | Ht 68.0 in | Wt 194.2 lb

## 2019-08-22 DIAGNOSIS — E890 Postprocedural hypothyroidism: Secondary | ICD-10-CM

## 2019-08-22 DIAGNOSIS — Z23 Encounter for immunization: Secondary | ICD-10-CM | POA: Diagnosis not present

## 2019-08-22 DIAGNOSIS — K219 Gastro-esophageal reflux disease without esophagitis: Secondary | ICD-10-CM | POA: Diagnosis not present

## 2019-08-22 DIAGNOSIS — Z0000 Encounter for general adult medical examination without abnormal findings: Secondary | ICD-10-CM

## 2019-08-22 LAB — COMPREHENSIVE METABOLIC PANEL
ALT: 34 U/L (ref 0–35)
AST: 26 U/L (ref 0–37)
Albumin: 4.4 g/dL (ref 3.5–5.2)
Alkaline Phosphatase: 106 U/L (ref 39–117)
BUN: 15 mg/dL (ref 6–23)
CO2: 25 mEq/L (ref 19–32)
Calcium: 10.6 mg/dL — ABNORMAL HIGH (ref 8.4–10.5)
Chloride: 106 mEq/L (ref 96–112)
Creatinine, Ser: 1.14 mg/dL (ref 0.40–1.20)
GFR: 50.32 mL/min — ABNORMAL LOW (ref >60.00)
Glucose, Bld: 93 mg/dL (ref 70–99)
Potassium: 4.2 mEq/L (ref 3.5–5.1)
Sodium: 140 mEq/L (ref 135–145)
Total Bilirubin: 1.7 mg/dL — ABNORMAL HIGH (ref 0.2–1.2)
Total Protein: 7.4 g/dL (ref 6.0–8.3)

## 2019-08-22 LAB — CBC
HCT: 40 % (ref 36.0–46.0)
Hemoglobin: 13.8 g/dL (ref 12.0–15.0)
MCHC: 34.6 g/dL (ref 30.0–36.0)
MCV: 81.2 fl (ref 78.0–100.0)
Platelets: 241 10*3/uL (ref 150.0–400.0)
RBC: 4.93 Mil/uL (ref 3.87–5.11)
RDW: 15.1 % (ref 11.5–15.5)
WBC: 6.3 10*3/uL (ref 4.0–10.5)

## 2019-08-22 LAB — LIPID PANEL
Cholesterol: 158 mg/dL (ref 0–200)
HDL: 48.4 mg/dL (ref >39.00)
LDL Cholesterol: 82 mg/dL (ref 0–99)
NonHDL: 109.14
Total CHOL/HDL Ratio: 3
Triglycerides: 135 mg/dL (ref 0.0–149.0)
VLDL: 27 mg/dL (ref 0.0–40.0)

## 2019-08-22 LAB — T4, FREE: Free T4: 1.28 ng/dL (ref 0.60–1.60)

## 2019-08-22 LAB — TSH: TSH: 0.87 u[IU]/mL (ref 0.35–4.50)

## 2019-08-22 LAB — HEMOGLOBIN A1C: Hgb A1c MFr Bld: 5.4 % (ref 4.6–6.5)

## 2019-08-22 MED ORDER — LEVOTHYROXINE SODIUM 150 MCG PO TABS: 150.0000 ug | ORAL_TABLET | Freq: Every morning | ORAL | 3 refills | Status: DC

## 2019-08-22 MED ORDER — OMEPRAZOLE 10 MG PO CPDR: 10.0000 mg | DELAYED_RELEASE_CAPSULE | Freq: Two times a day (BID) | ORAL | 2 refills | Status: DC

## 2019-08-22 NOTE — Patient Instructions (Addendum)
Give [place] 2-3 business days to get the results of your labs back.   Keep the diet clean and stay active.  I want your blood pressure <140/90 consistently.   I recommend getting the flu shot in mid October. This suggestion would change if the CDC comes out with a different recommendation.   Let us know if you need anything.

## 2019-08-22 NOTE — Progress Notes (Signed)
Chief Complaint  Patient presents with  . Annual Exam     Well Woman [name] is here for a complete physical.   Her last physical was last year.  Current diet: in general, a "pretty good" diet. Current exercise: walking and running; yoga. Weight is stable and she denies fatigue out of ordinary.  Seatbelt? Yes   Health Maintenance Pap/HPV- Yes Mammogram- Yes Colon cancer screening-Yes Shingrix- No Tetanus- Yes Hep C screening- Yes HIV screening- Yes  Past Medical History:  Diagnosis Date  . Allergy   . Arthritis   . Blood transfusion without reported diagnosis    breast cancer 2016  . Breast cancer [facility]) 12/2014   Seeing [facility] doctors  . CAD (coronary artery disease)    open heart surgery 09/12/10  . Cancer [facility]) 2007   thyroid  . Family hx of colon cancer   . GERD (gastroesophageal reflux disease)   . Heart disease   . History of thyroid cancer   . Hypothyroid   . Overweight(278.02)   . PONV (postoperative nausea and vomiting)      Past Surgical History:  Procedure Laterality Date  . arm surgery     rt  . BILATERAL TOTAL MASTECTOMY WITH AXILLARY LYMPH NODE DISSECTION    . coronary artery bypass grafting x1  09/13/10   [place] Gum  . HERNIA REPAIR  1977   inguinal, ?side  . KNEE ARTHROSCOPY Right 09/13/2015   pt reported  . THYROIDECTOMY    . ULNAR NERVE REPAIR Right 1989, 2002   Medications  Current Outpatient Medications on File Prior to Visit  Medication Sig Dispense Refill  . levonorgestrel (MIRENA) 20 MCG/24HR IUD 1 each by Intrauterine route once.      Marland Kitchen levothyroxine (SYNTHROID) 150 MCG tablet Take 1 tablet (150 mcg total) by mouth every morning. 90 tablet 3  . meloxicam (MOBIC) 15 MG tablet Take 15 mg by mouth daily.    Marland Kitchen omeprazole (PRILOSEC) 10 MG capsule Take 1 capsule (10 mg total) by mouth 2 (two) times daily. (Patient taking differently: Take 10 mg by mouth daily. ) 180 capsule 1    Allergies No Known Allergies  Review of  Systems: Constitutional:  no unexpected weight changes Eye:  no recent significant change in vision Ear/Nose/Mouth/Throat:  Ears:  no recent change in hearing Nose/Mouth/Throat:  no complaints of nasal congestion, no sore throat Cardiovascular: no chest pain Respiratory:  no shortness of breath Gastrointestinal:  no abdominal pain, no change in bowel habits GU:  Female: negative for dysuria or pelvic pain Musculoskeletal/Extremities:  no pain of the joints Integumentary (Skin/Breast):  no abnormal skin lesions reported  Neurologic:  no headaches Endocrine:  denies fatigue  Exam BP 110/80 (BP Location: Left Arm, Patient Position: Sitting, Cuff Size: Normal)   Pulse 100   Temp 98 F (36.7 C) (Oral)   Ht 5\' 8"  (1.727 m)   Wt 194 lb 4 oz (88.1 kg)   SpO2 96%   BMI 29.54 kg/m  General:  well developed, well nourished, in no apparent distress Skin:  no significant moles, warts, or growths Head:  no masses, lesions, or tenderness Eyes:  pupils equal and round, sclera anicteric without injection Ears:  canals without lesions, TMs shiny without retraction, no obvious effusion, no erythema Nose:  nares patent, septum midline, mucosa normal, and no drainage or sinus tenderness Throat/Pharynx:  lips and gingiva without lesion; tongue and uvula midline; non-inflamed pharynx; no exudates or postnasal drainage Neck: neck supple without  adenopathy, thyromegaly, or masses Lungs:  clear to auscultation, breath sounds equal bilaterally, no respiratory distress Cardio:  regular rate and rhythm, no LE edema Abdomen:  abdomen soft, nontender; bowel sounds normal; no masses or organomegaly Genital: Defer to GYN Musculoskeletal:  symmetrical muscle groups noted without atrophy or deformity Extremities:  no clubbing, cyanosis, or edema, no deformities, no skin discoloration Neuro:  gait normal; deep tendon reflexes normal and symmetric Psych: well oriented with normal range of affect and appropriate  judgment/insight  Assessment and Plan  Well adult exam - Plan: CBC, Comprehensive metabolic panel, Hemoglobin A1c, Lipid panel  Postoperative hypothyroidism - Plan: TSH, T4, free  Gastroesophageal reflux disease - Plan: omeprazole (PRILOSEC) 10 MG capsule  Need for shingles vaccine - Plan: Varicella-zoster vaccine IM (Shingrix)   Well 49 y.o. female. Counseled on diet and exercise. Other orders as above. She made note of home BP readings being 120-130's/80's, higher than here. She will cont to monitor and will ck against [place] or cards.  Elevated BP and dyspnea with minimal exercise. I want her to f/u with cards for this and continue light-moderate exercise in meanwhile.  Follow up in 6 mo or prn, will do 2nd Shingrix. The patient voiced understanding and agreement to the plan.  [place], DO 08/22/19 9:20 AM

## 2019-08-29 ENCOUNTER — Other Ambulatory Visit: Payer: Self-pay

## 2019-08-29 ENCOUNTER — Encounter: Payer: Self-pay | Admitting: Family Medicine

## 2019-08-29 ENCOUNTER — Ambulatory Visit (INDEPENDENT_AMBULATORY_CARE_PROVIDER_SITE_OTHER): Payer: BC Managed Care – PPO | Admitting: Family Medicine

## 2019-08-29 VITALS — BP 113/87 | HR 65 | Ht 68.0 in | Wt 198.0 lb

## 2019-08-29 DIAGNOSIS — E890 Postprocedural hypothyroidism: Secondary | ICD-10-CM

## 2019-08-29 DIAGNOSIS — Z1509 Genetic susceptibility to other malignant neoplasm: Secondary | ICD-10-CM

## 2019-08-29 DIAGNOSIS — D0511 Intraductal carcinoma in situ of right breast: Secondary | ICD-10-CM

## 2019-08-29 DIAGNOSIS — Z975 Presence of (intrauterine) contraceptive device: Secondary | ICD-10-CM

## 2019-08-29 DIAGNOSIS — Z01419 Encounter for gynecological examination (general) (routine) without abnormal findings: Secondary | ICD-10-CM

## 2019-08-29 NOTE — Progress Notes (Signed)
GYNECOLOGY ANNUAL PREVENTATIVE CARE ENCOUNTER NOTE  Subjective:   [name] is a 49 y.o. No obstetric history on file. female here for a routine annual gynecologic exam.  Current complaints: none.   Denies abnormal vaginal bleeding, discharge, pelvic pain, problems with intercourse or other gynecologic concerns.    History of breast cancer, had double mastectomy. 4 years cancer free. Continues to see oncology.  Gynecologic History No LMP recorded. (Menstrual status: IUD). Patient is sexually active  Contraception: IUD - placed in 2018 Last Pap: 06/2018. Results were: normal Last mammogram: n/a  Obstetric History OB History  No obstetric history on file.    Past Medical History:  Diagnosis Date  . Allergy   . Arthritis   . Blood transfusion without reported diagnosis    breast cancer 2016  . Breast cancer [facility]) 12/2014   Seeing [facility] doctors  . CAD (coronary artery disease)    open heart surgery 09/12/10  . Cancer [facility]) 2007   thyroid  . Family hx of colon cancer   . GERD (gastroesophageal reflux disease)   . Gilbert disease   . Heart disease   . History of thyroid cancer   . Hypothyroid   . Overweight(278.02)   . PONV (postoperative nausea and vomiting)     Past Surgical History:  Procedure Laterality Date  . arm surgery     rt  . BILATERAL TOTAL MASTECTOMY WITH AXILLARY LYMPH NODE DISSECTION    . coronary artery bypass grafting x1  09/13/10   [place] Gum  . HERNIA REPAIR  1977   inguinal, ?side  . KNEE ARTHROSCOPY Right 09/13/2015   pt reported  . THYROIDECTOMY    . ULNAR NERVE REPAIR Right 1989, 2002    Current Outpatient Medications on File Prior to Visit  Medication Sig Dispense Refill  . aspirin EC 81 MG tablet Take 81 mg by mouth daily. Swallow whole.    . levonorgestrel (MIRENA) 20 MCG/24HR IUD 1 each by Intrauterine route once.      Marland Kitchen levothyroxine (SYNTHROID) 150 MCG tablet Take 1 tablet (150 mcg total) by mouth every morning. 90 tablet  3  . omeprazole (PRILOSEC) 10 MG capsule Take 1 capsule (10 mg total) by mouth 2 (two) times daily. 180 capsule 2  . meloxicam (MOBIC) 15 MG tablet Take 15 mg by mouth daily. (Patient not taking: Reported on 08/29/2019)     No current facility-administered medications on file prior to visit.    No Known Allergies  Social History   Socioeconomic History  . Marital status: Married    Spouse name: Not on file  . Number of children: 2  . Years of education: 22  . Highest education level: Not on file  Occupational History  . Occupation: Building control surveyor: [employer]  Tobacco Use  . Smoking status: Never Smoker  . Smokeless tobacco: Never Used  Vaping Use  . Vaping Use: Never used  Substance and Sexual Activity  . Alcohol use: Yes    Comment: rarely  . Drug use: No  . Sexual activity: Yes    Birth control/protection: I.U.D.  Other Topics Concern  . 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 Difficulty of Paying Living Expenses:   Food Insecurity:   . Worried About Charity fundraiser in the Last Year:   . Arboriculturist in the Last Year:   Transportation Needs:   . Film/video editor (Medical):   Marland Kitchen Lack of Transportation (Non-Medical):   Physical Activity:   . Days of Exercise per Week:   . Minutes of Exercise per Session:   Stress:   . Feeling of Stress :   Social Connections:   . Frequency of Communication with Friends and Family:   . Frequency of Social Gatherings with Friends and Family:   . Attends Religious Services:   . Active Member of Clubs or Organizations:   . Attends Archivist Meetings:   Marland Kitchen Marital Status:   Intimate Partner Violence:   . Fear of Current or Ex-Partner:   . Emotionally  Abused:   Marland Kitchen Physically Abused:   . Sexually Abused:     Family History  Problem Relation Age of Onset  . Hypertension Mother   . Colon polyps Mother   . Arthritis Mother   . Cancer Mother        breast  . Breast cancer Mother   . Hypertension Father   . Hiatal hernia Father   . Hypertension Sister   . Lupus Maternal Aunt   . Heart disease Maternal Aunt   . Diabetes Maternal Aunt   . Arthritis Paternal Grandmother   . Diabetes Paternal Grandmother   . Parkinson's disease Paternal Grandmother   . Coronary artery disease Maternal Grandfather   . Cancer Maternal Aunt        breast, ovarian  . Breast cancer Maternal Aunt   . Diabetes Maternal Aunt   . Other Maternal Aunt        ?auto immune disease  . Colon cancer Maternal Uncle   . Esophageal cancer Neg Hx   . Rectal cancer Neg Hx   . Stomach cancer Neg Hx     The following portions of the patient's history were reviewed and updated as appropriate: allergies, current medications, past family history, past medical history, past social history, past surgical history and problem list.  Review of Systems Pertinent items are noted in HPI.   Objective:  BP 113/87   Pulse 65   Ht 5\' 8"  (1.727 m)   Wt 198 lb (89.8 kg)   BMI 30.11 kg/m  Wt Readings from Last 3 Encounters:  08/29/19 198 lb (89.8 kg)  08/22/19 194 lb 4 oz (88.1 kg)  05/23/19 200 lb (90.7 kg)     Chaperone present during exam  CONSTITUTIONAL: Well-developed, well-nourished female in no acute distress.  HENT:  Normocephalic, atraumatic, External right and left ear normal. Oropharynx is clear and moist EYES: Conjunctivae and EOM are normal. Pupils are equal, round, and reactive to light. No scleral icterus.  NECK: Normal range of motion, supple, no masses.  Normal thyroid.   CARDIOVASCULAR: Normal heart rate noted, regular rhythm RESPIRATORY: Clear to auscultation bilaterally. Effort and breath sounds normal, no problems with respiration noted. BREASTS:  Symmetric in size. No masses, skin changes, nipple drainage, or lymphadenopathy. ABDOMEN: Soft, normal bowel sounds, no distention noted.  No tenderness, rebound or guarding.  PELVIC: Normal appearing external genitalia; normal appearing vaginal mucosa and cervix.  No abnormal discharge noted. IUD strings seen. MUSCULOSKELETAL: Normal range of motion. No tenderness.  No cyanosis, clubbing, or edema.  2+ distal pulses. SKIN: Skin is warm  and dry. No rash noted. Not diaphoretic. No erythema. No pallor. NEUROLOGIC: Alert and oriented to person, place, and time. Normal reflexes, muscle tone coordination. No cranial nerve deficit noted. PSYCHIATRIC: Normal mood and affect. Normal behavior. Normal judgment and thought content.  Assessment:  Annual gynecologic examination with pap smear   Plan:  1. Well woman exam with routine gynecological exam  2. Ductal carcinoma in situ (DCIS) of right breast S/p bilateral mastectomy. Continues with oncology  3. IUD (intrauterine device) in place No concerns. Will need removal/replacement in 2025  4. Genetic predisposition to cancer Sees dermatology due to melanoma risk MR/MRCP for pancreatic cancer screening.  5. Postoperative hypothyroidism On synthroid.   Routine preventative health maintenance measures emphasized. Please refer to After Visit Summary for other counseling recommendations.    [place]oma Boston, [place] for [employer]

## 2019-09-22 ENCOUNTER — Other Ambulatory Visit (INDEPENDENT_AMBULATORY_CARE_PROVIDER_SITE_OTHER): Payer: BC Managed Care – PPO

## 2019-09-22 ENCOUNTER — Other Ambulatory Visit: Payer: Self-pay

## 2019-09-22 LAB — COMPREHENSIVE METABOLIC PANEL
AG Ratio: 1.7 (calc) (ref 1.0–2.5)
ALT: 59 U/L — ABNORMAL HIGH (ref 6–29)
AST: 39 U/L — ABNORMAL HIGH (ref 10–35)
Albumin: 3.8 g/dL (ref 3.6–5.1)
Alkaline phosphatase (APISO): 103 U/L (ref 37–153)
BUN: 12 mg/dL (ref 7–25)
CO2: 25 mmol/L (ref 20–32)
Calcium: 9.4 mg/dL (ref 8.6–10.4)
Chloride: 107 mmol/L (ref 98–110)
Creat: 0.94 mg/dL (ref 0.50–1.05)
Globulin: 2.3 g/dL (calc) (ref 1.9–3.7)
Glucose, Bld: 84 mg/dL (ref 65–99)
Potassium: 4.1 mmol/L (ref 3.5–5.3)
Sodium: 139 mmol/L (ref 135–146)
Total Bilirubin: 1.2 mg/dL (ref 0.2–1.2)
Total Protein: 6.1 g/dL (ref 6.1–8.1)

## 2019-09-23 LAB — PARATHYROID HORMONE, INTACT (NO CA): PTH: 61 pg/mL (ref 14–64)

## 2019-11-30 DIAGNOSIS — L821 Other seborrheic keratosis: Secondary | ICD-10-CM | POA: Diagnosis not present

## 2019-11-30 DIAGNOSIS — L814 Other melanin hyperpigmentation: Secondary | ICD-10-CM | POA: Diagnosis not present

## 2019-11-30 DIAGNOSIS — D2261 Melanocytic nevi of right upper limb, including shoulder: Secondary | ICD-10-CM | POA: Diagnosis not present

## 2019-11-30 DIAGNOSIS — D225 Melanocytic nevi of trunk: Secondary | ICD-10-CM | POA: Diagnosis not present

## 2019-12-07 ENCOUNTER — Other Ambulatory Visit: Payer: Self-pay

## 2019-12-07 ENCOUNTER — Encounter: Payer: Self-pay | Admitting: Cardiovascular Disease

## 2019-12-07 ENCOUNTER — Ambulatory Visit: Payer: BC Managed Care – PPO | Admitting: Cardiovascular Disease

## 2019-12-07 VITALS — BP 120/90 | HR 83 | Ht 68.0 in | Wt 201.2 lb

## 2019-12-07 DIAGNOSIS — I2510 Atherosclerotic heart disease of native coronary artery without angina pectoris: Secondary | ICD-10-CM

## 2019-12-07 DIAGNOSIS — I10 Essential (primary) hypertension: Secondary | ICD-10-CM | POA: Diagnosis not present

## 2019-12-07 MED ORDER — METOPROLOL SUCCINATE ER 25 MG PO TB24: 25.0000 mg | ORAL_TABLET | Freq: Every day | ORAL | 3 refills | Status: DC

## 2019-12-07 NOTE — Patient Instructions (Signed)
Medication Instructions:  1) START TOPROL 25 mg daily *If you need a refill on your cardiac medications before your next appointment, please call your pharmacy*  Testing/Procedures: You will be called to arrange a stress echocardiogram once Covid testing is no longer mandatory.   Follow-Up: At [facility], you and your health needs are our priority.  As part of our continuing mission to provide you with exceptional heart care, we have created designated Provider Care Teams.  These Care Teams include your primary Cardiologist (physician) and Advanced Practice Providers (APPs -  Physician Assistants and Nurse Practitioners) who all work together to provide you with the care you need, when you need it. Your next appointment:   12 month(s) The format for your next appointment:   In Person Provider:   You may see Sherren Mocha, MD or one of the following Advanced Practice Providers on your designated Care Team:    Richardson Dopp, PA-C  [place], [place]

## 2019-12-07 NOTE — Progress Notes (Signed)
Cardiology Office Note:    Date:  12/07/2019   ID:  [name], DOB Apr 20, 1970, MRN [id number]  PCP:  Shelda Pal, DO  [place] Cardiologist:  Sherren Mocha, MD  [place] Electrophysiologist:  None   Referring MD: Shelda Pal*   Chief Complaint  Patient presents with  . Hypertension    History of Present Illness:    [name] is a 49 y.o. female with a hx of coronary artery dissection, presenting today for follow-up evaluation.  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 She underwent single-vessel CABG. Follow-up gated cardiac CTA demonstrated healing of the native RCA and continued patency of the saphenous vein graft RCA. LV function at follow-up has been normal with an ejection fraction of 55-60%.   The patient has been exercising regularly.  She just ran a half marathon.  She continues to have problems with elevated heart rate when she exercises.  Her heart rate approaches 200 bpm at times.  She has to slow down until her heart rate is in the 160s and then she will go again.  She denies chest pain or pressure.  She denies shortness of breath or leg swelling.  She has also noticed that her diastolic blood pressure has been consistently around 90 or above.  Past Medical History:  Diagnosis Date  . Allergy   . Arthritis   . Blood transfusion without reported diagnosis    breast cancer 2016  . Breast cancer [facility]) 12/2014   Seeing [facility] doctors  . CAD (coronary artery disease)    open heart surgery 09/12/10  . Cancer [facility]) 2007   thyroid  . Family hx of colon cancer   . GERD (gastroesophageal reflux disease)   . Gilbert disease   . Heart disease   . History of thyroid cancer   . Hypothyroid   . Overweight(278.02)   . PONV (postoperative nausea and vomiting)     Past Surgical History:  Procedure  Laterality Date  . arm surgery     rt  . BILATERAL TOTAL MASTECTOMY WITH AXILLARY LYMPH NODE DISSECTION    . coronary artery bypass grafting x1  09/13/10   [place] Gum  . HERNIA REPAIR  1977   inguinal, ?side  . KNEE ARTHROSCOPY Right 09/13/2015   pt reported  . THYROIDECTOMY    . ULNAR NERVE REPAIR Right 1989, 2002    Current Medications: Current Meds  Medication Sig  . aspirin EC 81 MG tablet Take 81 mg by mouth daily. Swallow whole.  . levonorgestrel (MIRENA) 20 MCG/24HR IUD 1 each by Intrauterine route once.    Marland Kitchen levothyroxine (SYNTHROID) 150 MCG tablet Take 1 tablet (150 mcg total) by mouth every morning.  Marland Kitchen omeprazole (PRILOSEC) 10 MG capsule Take 1 capsule (10 mg total) by mouth 2 (two) times daily.     Allergies:   Patient has no known allergies.   Social History   Socioeconomic History  . Marital status: Married    Spouse name: Not on file  . Number of children: 2  . Years of education: 61  . Highest education level: Not on file  Occupational History  . Occupation: Building control surveyor: [employer]  Tobacco Use  . Smoking status: Never Smoker  . Smokeless tobacco: Never Used  Vaping Use  . Vaping Use: Never used  Substance and Sexual Activity  .  Alcohol use: Yes    Comment: rarely  . Drug use: No  . Sexual activity: Yes    Birth control/protection: I.U.D.  Other Topics Concern  . 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 Difficulty of Paying Living Expenses: Not on file  Food Insecurity:   . Worried About Charity fundraiser in the Last Year: Not on file  . Ran Out of Food in the Last Year: Not on file  Transportation Needs:   . Lack of Transportation (Medical): Not on file  . Lack of Transportation  (Non-Medical): Not on file  Physical Activity:   . Days of Exercise per Week: Not on file  . Minutes of Exercise per Session: Not on file  Stress:   . Feeling of Stress : Not on file  Social Connections:   . Frequency of Communication with Friends and Family: Not on file  . Frequency of Social Gatherings with Friends and Family: Not on file  . Attends Religious Services: Not on file  . Active Member of Clubs or Organizations: Not on file  . Attends Archivist Meetings: Not on file  . Marital Status: Not on file     Family History: The patient's family history includes Arthritis in her mother and paternal grandmother; Breast cancer in her maternal aunt and mother; Cancer in her maternal aunt and mother; Colon cancer in her maternal uncle; Colon polyps in her mother; Coronary artery disease in her maternal grandfather; Diabetes in her maternal aunt, maternal aunt, and paternal grandmother; Heart disease in her maternal aunt; Hiatal hernia in her father; Hypertension in her father, mother, and sister; Lupus in her maternal aunt; Other in her maternal aunt; Parkinson's disease in her paternal grandmother. There is no history of Esophageal cancer, Rectal cancer, or Stomach cancer.  ROS:   Please see the history of present illness.    All other systems reviewed and are negative.  EKGs/Labs/Other Studies Reviewed:    EKG:  EKG is ordered today.  The ekg ordered today demonstrates normal sinus rhythm 83 bpm, within normal limits.  Recent Labs: 08/22/2019: Hemoglobin 13.8; Platelets 241.0; TSH 0.87 09/22/2019: ALT 59; BUN 12; Creat 0.94; Potassium 4.1; Sodium 139  Recent Lipid Panel    Component Value Date/Time   CHOL 158 08/22/2019 0808   TRIG 135.0 08/22/2019 0808   HDL 48.40 08/22/2019 0808   CHOLHDL 3 08/22/2019 0808   VLDL 27.0 08/22/2019 0808   LDLCALC 82 08/22/2019 0808     Risk Assessment/Calculations:       Physical Exam:    VS:  BP 120/90   Pulse 83   Ht 5\' 8"   (1.727 m)   Wt 201 lb 3.2 oz (91.3 kg)   SpO2 99%   BMI 30.59 kg/m     Wt Readings from Last 3 Encounters:  12/07/19 201 lb 3.2 oz (91.3 kg)  08/29/19 198 lb (89.8 kg)  08/22/19 194 lb 4 oz (88.1 kg)     GEN:  Well nourished, well developed in no acute distress HEENT: Normal NECK: No JVD; No carotid bruits LYMPHATICS: No lymphadenopathy CARDIAC: RRR, no murmurs, rubs, gallops RESPIRATORY:  Clear to auscultation without rales, wheezing or  rhonchi  ABDOMEN: Soft, non-tender, non-distended MUSCULOSKELETAL:  No edema; No deformity  SKIN: Warm and dry NEUROLOGIC:  Alert and oriented x 3 PSYCHIATRIC:  Normal affect   ASSESSMENT:    1. Coronary artery disease involving native coronary artery of native heart without angina pectoris   2. Essential hypertension    PLAN:    In order of problems listed above:  1. The patient continues to demonstrate good exercise capacity with no anginal symptoms.  She remains on aspirin 81 mg daily.  We discussed the idea of an exercise echocardiogram to evaluate her heart rate response to exercise and perform surveillance for ischemia.  The patient prefers to wait until COVID-19 pretesting is no longer required.  We will be in touch with her in the future about performing an exercise echocardiogram. 2. Blood pressure control is suboptimal.  I think a beta-blocker might be a good first choice since she is having issues with elevated heart rates on a regular basis.  She will be started on metoprolol succinate 25 mg daily.     Shared Decision Making/Informed Consent        Medication Adjustments/Labs and Tests Ordered: Current medicines are reviewed at length with the patient today.  Concerns regarding medicines are outlined above.  No orders of the defined types were placed in this encounter.  Meds ordered this encounter  Medications  . metoprolol succinate (TOPROL XL) 25 MG 24 hr tablet    Sig: Take 1 tablet (25 mg total) by mouth daily.     Dispense:  90 tablet    Refill:  3    Patient Instructions  Medication Instructions:  1) START TOPROL 25 mg daily *If you need a refill on your cardiac medications before your next appointment, please call your pharmacy*  Testing/Procedures: You will be called to arrange a stress echocardiogram once Covid testing is no longer mandatory.   Follow-Up: At [facility], you and your health needs are our priority.  As part of our continuing mission to provide you with exceptional heart care, we have created designated Provider Care Teams.  These Care Teams include your primary Cardiologist (physician) and Advanced Practice Providers (APPs -  Physician Assistants and Nurse Practitioners) who all work together to provide you with the care you need, when you need it. Your next appointment:   12 month(s) The format for your next appointment:   In Person Provider:   You may see Sherren Mocha, MD or one of the following Advanced Practice Providers on your designated Care Team:    Richardson Dopp, PA-C  Robbie [place], [place]      Signed, Sherren Mocha, MD  12/07/2019 9:40 AM    [place]

## 2019-12-14 NOTE — Addendum Note (Signed)
Addended by: Lanna Poche R on: 12/14/2019 05:28 PM   Modules accepted: Orders

## 2019-12-30 ENCOUNTER — Ambulatory Visit: Payer: BC Managed Care – PPO

## 2020-01-05 ENCOUNTER — Other Ambulatory Visit: Payer: Self-pay | Admitting: Oncology

## 2020-01-09 ENCOUNTER — Telehealth: Payer: Self-pay | Admitting: Oncology

## 2020-01-09 NOTE — Telephone Encounter (Signed)
Rescheduled appointment due to conflict w/breast cancer clinic. Patient is aware of changes.

## 2020-01-16 ENCOUNTER — Ambulatory Visit (HOSPITAL_COMMUNITY)
Admission: RE | Admit: 2020-01-16 | Discharge: 2020-01-16 | Disposition: A | Discharge status: Home / Self Care | Payer: BC Managed Care – PPO | Source: Ambulatory Visit | Attending: Oncology | Admitting: Oncology

## 2020-01-16 ENCOUNTER — Telehealth: Payer: Self-pay | Admitting: Oncology

## 2020-01-16 ENCOUNTER — Ambulatory Visit: Payer: BC Managed Care – PPO | Admitting: Family Medicine

## 2020-01-16 ENCOUNTER — Other Ambulatory Visit: Payer: Self-pay

## 2020-01-16 ENCOUNTER — Encounter (HOSPITAL_COMMUNITY): Payer: Self-pay

## 2020-01-16 ENCOUNTER — Encounter: Payer: Self-pay | Admitting: Family Medicine

## 2020-01-16 VITALS — BP 110/88 | HR 68 | Temp 98.0°F | Resp 18 | Ht 68.0 in | Wt 202.0 lb

## 2020-01-16 DIAGNOSIS — Q999 Chromosomal abnormality, unspecified: Secondary | ICD-10-CM

## 2020-01-16 DIAGNOSIS — G4733 Obstructive sleep apnea (adult) (pediatric): Secondary | ICD-10-CM

## 2020-01-16 DIAGNOSIS — Z1509 Genetic susceptibility to other malignant neoplasm: Secondary | ICD-10-CM

## 2020-01-16 DIAGNOSIS — N3000 Acute cystitis without hematuria: Secondary | ICD-10-CM | POA: Diagnosis not present

## 2020-01-16 DIAGNOSIS — C73 Malignant neoplasm of thyroid gland: Secondary | ICD-10-CM

## 2020-01-16 DIAGNOSIS — Z1501 Genetic susceptibility to malignant neoplasm of breast: Secondary | ICD-10-CM

## 2020-01-16 DIAGNOSIS — D0511 Intraductal carcinoma in situ of right breast: Secondary | ICD-10-CM

## 2020-01-16 LAB — POC URINALSYSI DIPSTICK (AUTOMATED)
Bilirubin, UA: NEGATIVE
Blood, UA: NEGATIVE
Glucose, UA: NEGATIVE
Ketones, UA: NEGATIVE
Nitrite, UA: NEGATIVE
Protein, UA: NEGATIVE
Spec Grav, UA: >=1.03 — AB (ref 1.010–1.025)
Urobilinogen, UA: 0.2 E.U./dL
pH, UA: 6 (ref 5.0–8.0)

## 2020-01-16 MED ORDER — SULFAMETHOXAZOLE-TRIMETHOPRIM 800-160 MG PO TABS: 1.0000 | ORAL_TABLET | Freq: Two times a day (BID) | ORAL | 0 refills | Status: AC

## 2020-01-16 NOTE — Telephone Encounter (Signed)
Rescheduled appointment per 1/3 schedule message. Patient is aware of changes. 

## 2020-01-16 NOTE — Progress Notes (Signed)
Chief Complaint  Patient presents with  . Back Pain    Pt states sxs started Saturday. Pt states having lower back pain, pain with walking, urinary freq and pressure after using the bath room.     PAISLEE SZATKOWSKI is a 50 y.o. female here for possible UTI.  Duration: 2 days. Symptoms: Dysuria, urinary frequency and flank pain, bilateral Denies: hematuria, urinary hesitancy, urinary retention, urgency, vaginal discharge Hx of recurrent UTI? No Denies new sexual partners.  Past Medical History:  Diagnosis Date  . Allergy   . Arthritis   . Blood transfusion without reported diagnosis    breast cancer 2016  . Breast cancer [facility]) 12/2014   Seeing [facility] doctors  . CAD (coronary artery disease)    open heart surgery 09/12/10  . Cancer [facility]) 2007   thyroid  . Family hx of colon cancer   . GERD (gastroesophageal reflux disease)   . Gilbert disease   . Heart disease   . History of thyroid cancer   . Hypothyroid   . Overweight(278.02)   . PONV (postoperative nausea and vomiting)      BP 110/88 (BP Location: Left Arm, Patient Position: Sitting, Cuff Size: Normal)   Pulse 68   Temp 98 F (36.7 C) (Oral)   Resp 18   Ht 5\' 8"  (1.727 m)   Wt 202 lb (91.6 kg)   SpO2 97%   BMI 30.71 kg/m  General: Awake, alert, appears stated age Heart: RRR Lungs: CTAB, normal respiratory effort, no accessory muscle usage Abd: BS+, soft, ttp over suprapubic region, ND, no masses or organomegaly MSK: +b/l CVA tenderness, neg Lloyd's sign Psych: Age appropriate judgment and insight  Acute cystitis without hematuria - Plan: POCT Urinalysis Dipstick (Automated), Urine Culture, sulfamethoxazole-trimethoprim (BACTRIM DS) 800-160 MG tablet  7 d Bactrim DS bid to cover for early pyelo.  Stay hydrated. Seek immediate care if pt starts to develop fevers, new/worsening symptoms, uncontrollable N/V. F/u prn. The patient voiced understanding and agreement to the plan.  Stoddard,  DO 01/16/20 3:33 PM

## 2020-01-16 NOTE — Patient Instructions (Signed)
Stay hydrated.   Warning signs/symptoms: Uncontrollable nausea/vomiting, fevers, worsening symptoms despite treatment, confusion.  Give us around 2 business days to get culture back to you.  Let us know if you need anything. 

## 2020-01-18 LAB — URINE CULTURE
MICRO NUMBER:: 11375936
SPECIMEN QUALITY:: ADEQUATE

## 2020-01-19 ENCOUNTER — Other Ambulatory Visit: Payer: Self-pay | Admitting: Oncology

## 2020-01-19 ENCOUNTER — Other Ambulatory Visit: Payer: Self-pay

## 2020-01-19 ENCOUNTER — Ambulatory Visit (HOSPITAL_COMMUNITY)
Admission: RE | Admit: 2020-01-19 | Discharge: 2020-01-19 | Disposition: A | Discharge status: Home / Self Care | Payer: BC Managed Care – PPO | Source: Ambulatory Visit | Attending: Oncology | Admitting: Oncology

## 2020-01-19 DIAGNOSIS — Z1501 Genetic susceptibility to malignant neoplasm of breast: Secondary | ICD-10-CM | POA: Insufficient documentation

## 2020-01-19 DIAGNOSIS — C73 Malignant neoplasm of thyroid gland: Secondary | ICD-10-CM | POA: Insufficient documentation

## 2020-01-19 DIAGNOSIS — D0511 Intraductal carcinoma in situ of right breast: Secondary | ICD-10-CM | POA: Insufficient documentation

## 2020-01-19 DIAGNOSIS — Z1509 Genetic susceptibility to other malignant neoplasm: Secondary | ICD-10-CM | POA: Insufficient documentation

## 2020-01-19 DIAGNOSIS — Q999 Chromosomal abnormality, unspecified: Secondary | ICD-10-CM | POA: Diagnosis not present

## 2020-01-19 DIAGNOSIS — G4733 Obstructive sleep apnea (adult) (pediatric): Secondary | ICD-10-CM | POA: Diagnosis not present

## 2020-01-19 DIAGNOSIS — K8020 Calculus of gallbladder without cholecystitis without obstruction: Secondary | ICD-10-CM | POA: Diagnosis not present

## 2020-01-19 DIAGNOSIS — C259 Malignant neoplasm of pancreas, unspecified: Secondary | ICD-10-CM | POA: Diagnosis not present

## 2020-01-19 MED ORDER — GADOBUTROL 1 MMOL/ML IV SOLN
9.0000 mL | Freq: Once | INTRAVENOUS | Status: AC | PRN
  Administered 2020-01-19: 9 mL via INTRAVENOUS

## 2020-01-25 ENCOUNTER — Other Ambulatory Visit: Payer: BC Managed Care – PPO

## 2020-01-25 ENCOUNTER — Ambulatory Visit: Payer: BC Managed Care – PPO | Admitting: Oncology

## 2020-02-02 ENCOUNTER — Ambulatory Visit: Payer: BC Managed Care – PPO | Admitting: Oncology

## 2020-02-02 ENCOUNTER — Other Ambulatory Visit: Payer: BC Managed Care – PPO

## 2020-02-02 DIAGNOSIS — H5202 Hypermetropia, left eye: Secondary | ICD-10-CM | POA: Diagnosis not present

## 2020-02-02 DIAGNOSIS — H40013 Open angle with borderline findings, low risk, bilateral: Secondary | ICD-10-CM | POA: Diagnosis not present

## 2020-02-04 NOTE — Progress Notes (Signed)
[place]  Telephone:([phone]) [phone] Fax:(336) [phone]     ID: KALY MCQUARY DOB: Jun 25, 1970  MR#: [id number]  [id number]#:[id number]  Patient Care Team: Shelda Pal, DO as PCP - General (Family Medicine) Sherren Mocha, MD as PCP - Cardiology (Cardiology) Kathyrn Lass, MD (Family Medicine) Rolm Bookbinder, MD as Consulting Physician (General Surgery) Irene Limbo, MD as Consulting Physician (Plastic Surgery) Sherren Mocha, MD as Consulting Physician (Cardiology) [place], Jennefer Bravo, MD as Referring Physician (Dermatology) Jacinto Reap, MD (Plastic Surgery) Jola Schmidt, MD as Consulting Physician (Ophthalmology) OTHER MD:  CHIEF COMPLAINT: 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  The interval history is generally unremarkable.  She is mostly working from home and has learned to really like that.  Her husband Aaron Edelman is still having to go into the office.  They are planning to do 5K 10K and half marathons in February at [employer]  The patient continues screening for melanoma and pancreatic cancer in the setting of CDKN2A mutation. She sees her dermatologist, Dr. Renda Rolls, for melanoma skin screening and she sees Dr. Valetta Close for funduscopic exams  Since her last visit, she underwent follow up abdomen MRI on 01/19/2020 showing: no evidence of pancreatic mass or other suspicious abnormality.   REVIEW OF SYSTEMS: [name] is having some hip issues which are not new.  She is planning to see an orthopedist for this.  Aside from that a detailed review of systems today was stable   COVID 19 VACCINATION STATUS: [place] x2, booster October 2021   BREAST CANCER HISTORY: From the prior intake note:  [name] had bilateral  screening mammography at a mobile clinic 11/16/2014. This showed a focal density in the lower inner quadrant of the right breast and she was recalled for diagnostic right mammography and right breast ultrasonography at the [facility] 11/28/2014. This showed the breast density to be category B. In the lower inner right breast there was a group of amorphous calcifications spanning approximately 3 cm. These were new as compared to mammography from 2014. Ultrasound of the area showed an ill-defined region of hypoechoic tissue but no definite mass.  Biopsy of the right breast mass in question 12/05/2014 showed ([id number]) ductal carcinoma in situ, grade 1, arising within an intraductal papilloma. The cells were estrogen receptor 9200% positive, progesterone receptor 80-90% positive, both with strong staining intensity.  The patient's subsequent history is as detailed below.   PAST MEDICAL HISTORY: Past Medical History:  Diagnosis Date  . Allergy   . Arthritis   . Blood transfusion without reported diagnosis    breast cancer 2016  . Breast cancer [facility]) 12/2014   Seeing [facility] doctors  . CAD (coronary artery disease)    open heart surgery 09/12/10  . Cancer [facility]) 2007   thyroid  . Family hx of colon cancer   . GERD (gastroesophageal reflux disease)   . Gilbert disease   . Heart disease   . History of thyroid cancer   . Hypothyroid   . Overweight(278.02)   . PONV (postoperative nausea and vomiting)     PAST SURGICAL HISTORY: Past Surgical History:  Procedure Laterality Date  . arm surgery     rt  . BILATERAL TOTAL MASTECTOMY WITH AXILLARY LYMPH NODE DISSECTION    . coronary artery bypass grafting x1  09/13/10  [place]  . HERNIA REPAIR  1977   inguinal, ?side  . KNEE ARTHROSCOPY Right 09/13/2015   pt reported  . THYROIDECTOMY    . ULNAR NERVE REPAIR Right 1989, 2002    FAMILY HISTORY Family History  Problem Relation Age of Onset  . Hypertension Mother   .  Colon polyps Mother   . Arthritis Mother   . Cancer Mother        breast  . Breast cancer Mother   . Hypertension Father   . Hiatal hernia Father   . Hypertension Sister   . Lupus Maternal Aunt   . Heart disease Maternal Aunt   . Diabetes Maternal Aunt   . Arthritis Paternal Grandmother   . Diabetes Paternal Grandmother   . Parkinson's disease Paternal Grandmother   . Coronary artery disease Maternal Grandfather   . Cancer Maternal Aunt        breast, ovarian  . Breast cancer Maternal Aunt   . Diabetes Maternal Aunt   . Other Maternal Aunt        ?auto immune disease  . Colon cancer Maternal Uncle   . Esophageal cancer Neg Hx   . Rectal cancer Neg Hx   . Stomach cancer Neg Hx    The patient's parents are in their late 65s as of December 2016. The patient's mother was diagnosed with breast cancer age 39; on the maternal side, the maternal grandmother was diagnosed with uterine cancer at age 51. 2 maternal aunts had one breast cancer and one uterine cancer and one maternal uncle had colon cancer is were in their 16s and 56s. On the father's side there is an uncle with colon cancer age 16. There is no history of ovarian cancer to the patient's knowledge   GYNECOLOGIC HISTORY:  No LMP recorded. (Menstrual status: IUD). Menarche age 50, first live birth age 50. The patient is 50 P2. She still having regular periods. She has a Mirena IUD in place.   SOCIAL HISTORY: (Updated February 2021) [name] works as a Dealer. Her husband [name] is an Brewing technologist. Their son, [name], graduated from [employer] in [place] and is currently working as a Primary school teacher (Emergency planning/management officer); his significant other works for the school system. The patient's daughter, Ander Purpura is currently engaged, living in [place], and working for [employer] here in town.  The patient has no grandchildren   ADVANCED DIRECTIVES: In the absence of any documentation to the contrary, the patient's  spouse is their HCPOA.    HEALTH MAINTENANCE: Social History   Tobacco Use  . Smoking status: Never Smoker  . Smokeless tobacco: Never Used  Vaping Use  . Vaping Use: Never used  Substance Use Topics  . Alcohol use: Yes    Comment: rarely  . Drug use: No     Colonoscopy: 02/2019 (Dr. Fuller Plan), repeat due 2024  PAP: 06/2018, negative  Bone density: none on file   Lipid panel:  No Known Allergies  Current Outpatient Medications  Medication Sig Dispense Refill  . aspirin EC 81 MG tablet Take 81 mg by mouth daily. Swallow whole.    . levonorgestrel (MIRENA) 20 MCG/24HR IUD 1 each by Intrauterine route once.    Marland Kitchen levothyroxine (SYNTHROID) 150 MCG tablet Take 1 tablet (150 mcg total) by mouth every morning. 90 tablet 3  . metoprolol succinate (TOPROL XL) 25 MG 24 hr tablet Take 1 tablet (25 mg total) by mouth daily. 90 tablet 3  . omeprazole (PRILOSEC) 10  MG capsule Take 1 capsule (10 mg total) by mouth 2 (two) times daily. 180 capsule 2   No current facility-administered medications for this visit.    OBJECTIVE: White woman who appears younger than stated age 39:   02/06/20 1200  BP: (!) 115/58  Pulse: (!) 55  Resp: 18  Temp: 97.7 F (36.5 C)  SpO2: 97%     Body mass index is 30.96 kg/m.    ECOG FS:1 - Symptomatic but completely ambulatory  Sclerae unicteric, EOMs intact Wearing a mask No cervical or supraclavicular adenopathy Lungs no rales or rhonchi Heart regular rate and rhythm Abd soft, nontender, positive bowel sounds MSK no focal spinal tenderness, no upper extremity lymphedema Neuro: nonfocal, well oriented, appropriate affect Breasts: Status post bilateral mastectomies with bilateral DIEP reconstruction.  There are no findings of concern.  Both axillae are benign.   LAB RESULTS:  CMP     Component Value Date/Time   NA 138 02/06/2020 1131   NA 139 10/21/2016 0846   K 4.2 02/06/2020 1131   K 4.2 10/21/2016 0846   CL 106 02/06/2020 1131   CO2 25  02/06/2020 1131   CO2 24 10/21/2016 0846   GLUCOSE 93 02/06/2020 1131   GLUCOSE 91 10/21/2016 0846   BUN 16 02/06/2020 1131   BUN 10.1 10/21/2016 0846   CREATININE 1.00 02/06/2020 1131   CREATININE 0.94 09/22/2019 0736   CREATININE 1.0 10/21/2016 0846   CALCIUM 10.1 02/06/2020 1131   CALCIUM 9.1 10/21/2016 0846   PROT 7.1 02/06/2020 1131   PROT 6.6 10/21/2016 0846   ALBUMIN 4.0 02/06/2020 1131   ALBUMIN 3.7 10/21/2016 0846   AST 28 02/06/2020 1131   AST 23 12/08/2017 0902   AST 28 10/21/2016 0846   ALT 37 02/06/2020 1131   ALT 19 12/08/2017 0902   ALT 28 10/21/2016 0846   ALKPHOS 111 02/06/2020 1131   ALKPHOS 100 10/21/2016 0846   BILITOT 1.7 (H) 02/06/2020 1131   BILITOT 1.9 (H) 12/08/2017 0902   BILITOT 1.18 10/21/2016 0846   GFRNONAA >60 02/06/2020 1131   GFRNONAA >60 12/08/2017 0902   GFRAA >60 12/08/2017 0902    INo results found for: SPEP, UPEP  Lab Results  Component Value Date   WBC 8.0 02/06/2020   NEUTROABS 5.7 02/06/2020   HGB 13.8 02/06/2020   HCT 40.5 02/06/2020   MCV 84.0 02/06/2020   PLT 272 02/06/2020      Chemistry      Component Value Date/Time   NA 138 02/06/2020 1131   NA 139 10/21/2016 0846   K 4.2 02/06/2020 1131   K 4.2 10/21/2016 0846   CL 106 02/06/2020 1131   CO2 25 02/06/2020 1131   CO2 24 10/21/2016 0846   BUN 16 02/06/2020 1131   BUN 10.1 10/21/2016 0846   CREATININE 1.00 02/06/2020 1131   CREATININE 0.94 09/22/2019 0736   CREATININE 1.0 10/21/2016 0846      Component Value Date/Time   CALCIUM 10.1 02/06/2020 1131   CALCIUM 9.1 10/21/2016 0846   ALKPHOS 111 02/06/2020 1131   ALKPHOS 100 10/21/2016 0846   AST 28 02/06/2020 1131   AST 23 12/08/2017 0902   AST 28 10/21/2016 0846   ALT 37 02/06/2020 1131   ALT 19 12/08/2017 0902   ALT 28 10/21/2016 0846   BILITOT 1.7 (H) 02/06/2020 1131   BILITOT 1.9 (H) 12/08/2017 0902   BILITOT 1.18 10/21/2016 0846       No results found for: LABCA2  No components found for:  LABCA125  No results for input(s): INR in the last 168 hours.  Urinalysis    Component Value Date/Time   COLORURINE DARK YELLOW 02/20/2015 0705   APPEARANCEUR TURBID (A) 02/20/2015 0705   LABSPEC 1.023 02/20/2015 0705   PHURINE 5.5 02/20/2015 0705   GLUCOSEU NEGATIVE 02/20/2015 0705   GLUCOSEU NEGATIVE 01/30/2015 1428   HGBUR NEGATIVE 02/20/2015 0705   BILIRUBINUR negative 01/16/2020 1512   KETONESUR NEGATIVE 02/20/2015 0705   PROTEINUR Negative 01/16/2020 1512   PROTEINUR NEGATIVE 02/20/2015 0705   UROBILINOGEN 0.2 01/16/2020 1512   UROBILINOGEN 0.2 01/30/2015 1428   NITRITE negative 01/16/2020 1512   NITRITE NEGATIVE 02/20/2015 0705   LEUKOCYTESUR Moderate (2+) (A) 01/16/2020 1512    STUDIES: MR 3D Recon At Scanner  Result Date: 01/19/2020 CLINICAL DATA:  Pancreatic cancer, high risk surveillance, history of thyroid cancer, right breast cancer, Rosanna Randy syndrome EXAM: MRI ABDOMEN WITHOUT AND WITH CONTRAST (INCLUDING MRCP) TECHNIQUE: Multiplanar multisequence MR imaging of the abdomen was performed both before and after the administration of intravenous contrast. Heavily T2-weighted images of the biliary and pancreatic ducts were obtained, and three-dimensional MRCP images were rendered by post processing. CONTRAST:  14mL GADAVIST GADOBUTROL 1 MMOL/ML IV SOLN COMPARISON:  01/26/2019 FINDINGS: Lower chest: No acute findings. Hepatobiliary: No mass or other parenchymal abnormality identified. Multiple small gallstones in the gallbladder. No biliary ductal dilatation. Pancreas: No mass, inflammatory changes, or other parenchymal abnormality identified. No pancreatic ductal dilatation. Spleen:  Mild splenomegaly, maximum span 13.5 cm. Adrenals/Urinary Tract: No masses identified. No evidence of hydronephrosis. Stomach/Bowel: Visualized portions within the abdomen are unremarkable. Vascular/Lymphatic: No pathologically enlarged lymph nodes identified. No abdominal aortic aneurysm demonstrated.  Other:  None. Musculoskeletal: No suspicious bone lesions identified. IMPRESSION: 1. No evidence of pancreatic mass or other suspicious abnormality. 2. Cholelithiasis. 3. Mild splenomegaly. Electronically Signed   By: Eddie Candle M.D.   On: 01/19/2020 12:29   MR ABDOMEN MRCP W WO CONTAST  Result Date: 01/19/2020 CLINICAL DATA:  Pancreatic cancer, high risk surveillance, history of thyroid cancer, right breast cancer, Rosanna Randy syndrome EXAM: MRI ABDOMEN WITHOUT AND WITH CONTRAST (INCLUDING MRCP) TECHNIQUE: Multiplanar multisequence MR imaging of the abdomen was performed both before and after the administration of intravenous contrast. Heavily T2-weighted images of the biliary and pancreatic ducts were obtained, and three-dimensional MRCP images were rendered by post processing. CONTRAST:  82mL GADAVIST GADOBUTROL 1 MMOL/ML IV SOLN COMPARISON:  01/26/2019 FINDINGS: Lower chest: No acute findings. Hepatobiliary: No mass or other parenchymal abnormality identified. Multiple small gallstones in the gallbladder. No biliary ductal dilatation. Pancreas: No mass, inflammatory changes, or other parenchymal abnormality identified. No pancreatic ductal dilatation. Spleen:  Mild splenomegaly, maximum span 13.5 cm. Adrenals/Urinary Tract: No masses identified. No evidence of hydronephrosis. Stomach/Bowel: Visualized portions within the abdomen are unremarkable. Vascular/Lymphatic: No pathologically enlarged lymph nodes identified. No abdominal aortic aneurysm demonstrated. Other:  None. Musculoskeletal: No suspicious bone lesions identified. IMPRESSION: 1. No evidence of pancreatic mass or other suspicious abnormality. 2. Cholelithiasis. 3. Mild splenomegaly. Electronically Signed   By: Eddie Candle M.D.   On: 01/19/2020 12:29    ASSESSMENT: 50 y.o. [place] woman status post right breast lower inner quadrant biopsy 12/05/2014 for ductal carcinoma in situ arising from an intraductal papilloma, estrogen and progesterone  receptor positive  (1) genetics testing at [facility] obtained December 2016 showed a deleterious mutation in CDKN2A (specifically c.9_[id number] [Ala3 Pro11dup]).  The patient does not carry theCHEK2  Deletion present in her mother.  (  a)  Patients who carry a  CDKN2A mutation are at high risk of melanoma  (28-76% lifetime risk) and pancreatic cancer (17% lifetime risk).  (2)  Status post bilateral mastectomies with immediate DIEP reconstruction 02/24//2017 showing  (a)  On the left side, benign breast tissue  (b)  On the right side, pTis pN0, stage 0 DCIS  measuring, 2.8 cm, estrogen receptor and progesterone receptor positive,   (3)  Screening for melanoma with biannual skin exam is recommended ([place])   (4) screening for pancreatic cancer will consist of by annual MRI/MRCP  (a) abdominal MRI 04/12/2017shows only cholelithiasis and mild hepatomegaly  (b) abdominal MRI/MRCP 10/17/2015 showed normal pancreas, mild hepatomegaly, and cholelithiasis  (c) repeat abdominal MRCP is January 2019 and December 2019 show no pancreatic lesion  (5) Mirena IUD in place   PLAN:  Glendaly is now 5 years out from definitive surgery for her breast cancer with no evidence of disease recurrence.  She is very likely cured.  She continues under surveillance because of her deleterious mutation.  She is screened for melanoma by her dermatologist and her ophthalmologist.  We arrange for an MRCP every year.  Her next one will be in November of this year and I will see her shortly after that  She knows to call for any other issue that may develop before then  Total encounter time 25 minutes.*   Kailey Esquilin, Virgie Dad, MD  02/06/20 12:13 PM Medical Oncology and Hematology [facility] [address] [place], [place] [address] Tel. [phone]    Fax. [phone]   I, Wilburn Mylar, am acting as scribe for Dr. Virgie Dad. Dee Maday.  I, Lurline Del MD, have reviewed the above  documentation for accuracy and completeness, and I agree with the above.   *Total Encounter Time as defined by the Centers for Medicare and Medicaid Services includes, in addition to the face-to-face time of a patient visit (documented in the note above) non-face-to-face time: obtaining and reviewing outside history, ordering and reviewing medications, tests or procedures, care coordination (communications with other health care professionals or caregivers) and documentation in the medical record.

## 2020-02-06 ENCOUNTER — Other Ambulatory Visit: Payer: Self-pay

## 2020-02-06 ENCOUNTER — Inpatient Hospital Stay: Payer: BC Managed Care – PPO | Admitting: Oncology

## 2020-02-06 ENCOUNTER — Inpatient Hospital Stay: Payer: BC Managed Care – PPO | Attending: Oncology

## 2020-02-06 VITALS — BP 115/58 | HR 55 | Temp 97.7°F | Resp 18 | Ht 68.0 in | Wt 203.6 lb

## 2020-02-06 DIAGNOSIS — R162 Hepatomegaly with splenomegaly, not elsewhere classified: Secondary | ICD-10-CM | POA: Insufficient documentation

## 2020-02-06 DIAGNOSIS — D0511 Intraductal carcinoma in situ of right breast: Secondary | ICD-10-CM

## 2020-02-06 DIAGNOSIS — Z9013 Acquired absence of bilateral breasts and nipples: Secondary | ICD-10-CM | POA: Diagnosis not present

## 2020-02-06 DIAGNOSIS — Z832 Family history of diseases of the blood and blood-forming organs and certain disorders involving the immune mechanism: Secondary | ICD-10-CM | POA: Insufficient documentation

## 2020-02-06 DIAGNOSIS — Z833 Family history of diabetes mellitus: Secondary | ICD-10-CM | POA: Insufficient documentation

## 2020-02-06 DIAGNOSIS — C73 Malignant neoplasm of thyroid gland: Secondary | ICD-10-CM

## 2020-02-06 DIAGNOSIS — Z803 Family history of malignant neoplasm of breast: Secondary | ICD-10-CM | POA: Insufficient documentation

## 2020-02-06 DIAGNOSIS — G4733 Obstructive sleep apnea (adult) (pediatric): Secondary | ICD-10-CM

## 2020-02-06 DIAGNOSIS — I2510 Atherosclerotic heart disease of native coronary artery without angina pectoris: Secondary | ICD-10-CM | POA: Insufficient documentation

## 2020-02-06 DIAGNOSIS — Z8507 Personal history of malignant neoplasm of pancreas: Secondary | ICD-10-CM | POA: Diagnosis not present

## 2020-02-06 DIAGNOSIS — E039 Hypothyroidism, unspecified: Secondary | ICD-10-CM | POA: Insufficient documentation

## 2020-02-06 DIAGNOSIS — Z793 Long term (current) use of hormonal contraceptives: Secondary | ICD-10-CM | POA: Diagnosis not present

## 2020-02-06 DIAGNOSIS — Z79899 Other long term (current) drug therapy: Secondary | ICD-10-CM | POA: Diagnosis not present

## 2020-02-06 DIAGNOSIS — Z1509 Genetic susceptibility to other malignant neoplasm: Secondary | ICD-10-CM

## 2020-02-06 DIAGNOSIS — K8020 Calculus of gallbladder without cholecystitis without obstruction: Secondary | ICD-10-CM | POA: Diagnosis not present

## 2020-02-06 DIAGNOSIS — Z85850 Personal history of malignant neoplasm of thyroid: Secondary | ICD-10-CM | POA: Diagnosis not present

## 2020-02-06 DIAGNOSIS — Z8379 Family history of other diseases of the digestive system: Secondary | ICD-10-CM | POA: Insufficient documentation

## 2020-02-06 DIAGNOSIS — Z8261 Family history of arthritis: Secondary | ICD-10-CM | POA: Insufficient documentation

## 2020-02-06 DIAGNOSIS — Z8249 Family history of ischemic heart disease and other diseases of the circulatory system: Secondary | ICD-10-CM | POA: Diagnosis not present

## 2020-02-06 DIAGNOSIS — Z800 Family history of malignant neoplasm of digestive organs: Secondary | ICD-10-CM | POA: Diagnosis not present

## 2020-02-06 DIAGNOSIS — Z170 Estrogen receptor positive status [ER+]: Secondary | ICD-10-CM | POA: Insufficient documentation

## 2020-02-06 DIAGNOSIS — Q999 Chromosomal abnormality, unspecified: Secondary | ICD-10-CM

## 2020-02-06 DIAGNOSIS — Z1501 Genetic susceptibility to malignant neoplasm of breast: Secondary | ICD-10-CM

## 2020-02-06 LAB — COMPREHENSIVE METABOLIC PANEL
ALT: 37 U/L (ref 0–44)
AST: 28 U/L (ref 15–41)
Albumin: 4 g/dL (ref 3.5–5.0)
Alkaline Phosphatase: 111 U/L (ref 38–126)
Anion gap: 7 (ref 5–15)
BUN: 16 mg/dL (ref 6–20)
CO2: 25 mmol/L (ref 22–32)
Calcium: 10.1 mg/dL (ref 8.9–10.3)
Chloride: 106 mmol/L (ref 98–111)
Creatinine, Ser: 1 mg/dL (ref 0.44–1.00)
GFR, Estimated: >60 mL/min (ref >60)
Glucose, Bld: 93 mg/dL (ref 70–99)
Potassium: 4.2 mmol/L (ref 3.5–5.1)
Sodium: 138 mmol/L (ref 135–145)
Total Bilirubin: 1.7 mg/dL — ABNORMAL HIGH (ref 0.3–1.2)
Total Protein: 7.1 g/dL (ref 6.5–8.1)

## 2020-02-06 LAB — CBC WITH DIFFERENTIAL/PLATELET
Abs Immature Granulocytes: 0.02 10*3/uL (ref 0.00–0.07)
Basophils Absolute: 0.1 10*3/uL (ref 0.0–0.1)
Basophils Relative: 1 %
Eosinophils Absolute: 0 10*3/uL (ref 0.0–0.5)
Eosinophils Relative: 0 %
HCT: 40.5 % (ref 36.0–46.0)
Hemoglobin: 13.8 g/dL (ref 12.0–15.0)
Immature Granulocytes: 0 %
Lymphocytes Relative: 20 %
Lymphs Abs: 1.6 10*3/uL (ref 0.7–4.0)
MCH: 28.6 pg (ref 26.0–34.0)
MCHC: 34.1 g/dL (ref 30.0–36.0)
MCV: 84 fL (ref 80.0–100.0)
Monocytes Absolute: 0.6 10*3/uL (ref 0.1–1.0)
Monocytes Relative: 8 %
Neutro Abs: 5.7 10*3/uL (ref 1.7–7.7)
Neutrophils Relative %: 71 %
Platelets: 272 10*3/uL (ref 150–400)
RBC: 4.82 MIL/uL (ref 3.87–5.11)
RDW: 12.6 % (ref 11.5–15.5)
WBC: 8 10*3/uL (ref 4.0–10.5)
nRBC: 0 % (ref 0.0–0.2)

## 2020-02-07 ENCOUNTER — Telehealth: Payer: Self-pay | Admitting: Oncology

## 2020-02-07 NOTE — Telephone Encounter (Signed)
Scheduled appts per 1/24 los. Pt confirmed appt date and time.  

## 2020-02-22 ENCOUNTER — Other Ambulatory Visit: Payer: Self-pay

## 2020-02-22 ENCOUNTER — Encounter: Payer: Self-pay | Admitting: Family Medicine

## 2020-02-22 ENCOUNTER — Ambulatory Visit: Payer: BC Managed Care – PPO | Admitting: Family Medicine

## 2020-02-22 VITALS — BP 110/62 | HR 58 | Temp 97.8°F | Ht 68.0 in | Wt 205.2 lb

## 2020-02-22 DIAGNOSIS — E890 Postprocedural hypothyroidism: Secondary | ICD-10-CM | POA: Diagnosis not present

## 2020-02-22 DIAGNOSIS — K219 Gastro-esophageal reflux disease without esophagitis: Secondary | ICD-10-CM | POA: Diagnosis not present

## 2020-02-22 DIAGNOSIS — Z23 Encounter for immunization: Secondary | ICD-10-CM

## 2020-02-22 DIAGNOSIS — M25552 Pain in left hip: Secondary | ICD-10-CM | POA: Diagnosis not present

## 2020-02-22 LAB — TSH: TSH: 0.39 u[IU]/mL (ref 0.35–4.50)

## 2020-02-22 LAB — T4, FREE: Free T4: 1.27 ng/dL (ref 0.60–1.60)

## 2020-02-22 NOTE — Progress Notes (Signed)
Chief Complaint  Patient presents with  . Follow-up    Subjective:  Patient is a 50 y.o. female here for f/u.  Hypothyroidism Patient presents for follow-up of hypothyroidism.  Reports compliance with medication- Synthroid 150 mcg/d. Current symptoms include: denies fatigue, weight changes, heat/cold intolerance, bowel/skin changes or CVS symptoms She believes her dose should be unchanged  GERD Presents for reflux f/u. Reports compliance w omeprazole 10 mg bid. Reports no AE's.  S/s's are controlled. Denies N/V, bleeding, wt loss.  Pt has been having L hip pain for months that is worsening. It is waking her up at night. She was recommended to see Dr. Maureen Ralphs for this.   Past Medical History:  Diagnosis Date  . Allergy   . Arthritis   . Blood transfusion without reported diagnosis    breast cancer 2016  . Breast cancer [facility]) 12/2014   Seeing [facility] doctors  . CAD (coronary artery disease)    open heart surgery 09/12/10  . Cancer [facility]) 2007   thyroid  . Family hx of colon cancer   . GERD (gastroesophageal reflux disease)   . Gilbert disease   . Heart disease   . History of thyroid cancer   . Hypothyroid   . Overweight(278.02)   . PONV (postoperative nausea and vomiting)     Objective: BP 110/62 (BP Location: Left Arm, Patient Position: Sitting, Cuff Size: Normal)   Pulse (!) 58   Temp 97.8 F (36.6 C) (Oral)   Ht 5\' 8"  (1.727 m)   Wt 205 lb 4 oz (93.1 kg)   SpO2 98%   BMI 31.21 kg/m  General: Awake, appears stated age HEENT: MMM, EOMi Heart: RRR, no murmurs Lungs: CTAB, no rales, wheezes or rhonchi. No accessory muscle use Abd: BS+, S, NT, ND Psych: Age appropriate judgment and insight, normal affect and mood  Assessment and Plan: Postoperative hypothyroidism - Plan: TSH, T4, free  Gastroesophageal reflux disease, unspecified whether esophagitis present  Left hip pain - Plan: Ambulatory referral to Orthopedic Surgery  1. Cont Synthroid 150 mcg/d.  2.  Cont omeprazole 10 mg bid.  3. Refer to ortho given worsening pain.  F/u in 6 mo for CPE or prn. The patient voiced understanding and agreement to the plan.  Barclay, DO 02/22/20  7:57 AM

## 2020-02-22 NOTE — Patient Instructions (Addendum)
Keep the diet clean and stay active.  If you do not hear anything about your referral in the next 1-2 weeks, call our office and ask for an update.  Give [place] 2-3 business days to get the results of your labs back.   Let us know if you need anything.

## 2020-02-22 NOTE — Addendum Note (Signed)
Addended by: Sharon Seller B on: 02/22/2020 08:15 AM   Modules accepted: Orders

## 2020-03-16 DIAGNOSIS — M25552 Pain in left hip: Secondary | ICD-10-CM | POA: Diagnosis not present

## 2020-04-02 ENCOUNTER — Other Ambulatory Visit: Payer: Self-pay | Admitting: Family Medicine

## 2020-04-02 DIAGNOSIS — K219 Gastro-esophageal reflux disease without esophagitis: Secondary | ICD-10-CM

## 2020-04-02 MED ORDER — OMEPRAZOLE 10 MG PO CPDR: 10.0000 mg | DELAYED_RELEASE_CAPSULE | Freq: Two times a day (BID) | ORAL | 2 refills | Status: DC

## 2020-04-04 DIAGNOSIS — M25552 Pain in left hip: Secondary | ICD-10-CM | POA: Diagnosis not present

## 2020-04-13 DIAGNOSIS — M25552 Pain in left hip: Secondary | ICD-10-CM | POA: Diagnosis not present

## 2020-04-25 DIAGNOSIS — M25552 Pain in left hip: Secondary | ICD-10-CM | POA: Diagnosis not present

## 2020-04-27 DIAGNOSIS — M25552 Pain in left hip: Secondary | ICD-10-CM | POA: Diagnosis not present

## 2020-04-30 DIAGNOSIS — M25552 Pain in left hip: Secondary | ICD-10-CM | POA: Diagnosis not present

## 2020-05-02 DIAGNOSIS — M25552 Pain in left hip: Secondary | ICD-10-CM | POA: Diagnosis not present

## 2020-05-04 DIAGNOSIS — M25552 Pain in left hip: Secondary | ICD-10-CM | POA: Diagnosis not present

## 2020-05-07 DIAGNOSIS — M25552 Pain in left hip: Secondary | ICD-10-CM | POA: Diagnosis not present

## 2020-05-09 DIAGNOSIS — M25552 Pain in left hip: Secondary | ICD-10-CM | POA: Diagnosis not present

## 2020-05-10 ENCOUNTER — Telehealth: Payer: Self-pay

## 2020-05-10 DIAGNOSIS — I2510 Atherosclerotic heart disease of native coronary artery without angina pectoris: Secondary | ICD-10-CM

## 2020-05-10 NOTE — Telephone Encounter (Signed)
Stress echo ordered. Attestation order pended for Dr. Burt Knack to sign.

## 2020-05-10 NOTE — Telephone Encounter (Signed)
Per Dr. Antionette Char 12/07/19 office note, " 1. The patient continues to demonstrate good exercise capacity with no anginal symptoms.  She remains on aspirin 81 mg daily.  We discussed the idea of an exercise echocardiogram to evaluate her heart rate response to exercise and perform surveillance for ischemia.  The patient prefers to wait until COVID-19 pretesting is no longer required.  We will be in touch with her in the future about performing an exercise echocardiogram. "

## 2020-05-17 DIAGNOSIS — M25552 Pain in left hip: Secondary | ICD-10-CM | POA: Diagnosis not present

## 2020-05-21 DIAGNOSIS — M25552 Pain in left hip: Secondary | ICD-10-CM | POA: Diagnosis not present

## 2020-05-23 DIAGNOSIS — M25552 Pain in left hip: Secondary | ICD-10-CM | POA: Diagnosis not present

## 2020-05-25 DIAGNOSIS — M25552 Pain in left hip: Secondary | ICD-10-CM | POA: Diagnosis not present

## 2020-05-31 DIAGNOSIS — M25552 Pain in left hip: Secondary | ICD-10-CM | POA: Diagnosis not present

## 2020-06-01 ENCOUNTER — Telehealth (HOSPITAL_COMMUNITY): Payer: Self-pay | Admitting: *Deleted

## 2020-06-01 NOTE — Telephone Encounter (Signed)
Patient given detailed instructions per Stress Test Requisition Sheet for test on 06/05/20 at 1:45.Patient Notified to arrive 30 minutes early, and that it is imperative to arrive on time for appointment to keep from having the test rescheduled.  Patient verbalized understanding. [name]

## 2020-06-04 DIAGNOSIS — D225 Melanocytic nevi of trunk: Secondary | ICD-10-CM | POA: Diagnosis not present

## 2020-06-04 DIAGNOSIS — L814 Other melanin hyperpigmentation: Secondary | ICD-10-CM | POA: Diagnosis not present

## 2020-06-04 DIAGNOSIS — L821 Other seborrheic keratosis: Secondary | ICD-10-CM | POA: Diagnosis not present

## 2020-06-04 DIAGNOSIS — D2261 Melanocytic nevi of right upper limb, including shoulder: Secondary | ICD-10-CM | POA: Diagnosis not present

## 2020-06-05 ENCOUNTER — Other Ambulatory Visit: Payer: Self-pay

## 2020-06-05 ENCOUNTER — Ambulatory Visit (HOSPITAL_COMMUNITY): Payer: BC Managed Care – PPO | Attending: Internal Medicine

## 2020-06-05 ENCOUNTER — Ambulatory Visit (HOSPITAL_COMMUNITY): Payer: BC Managed Care – PPO

## 2020-06-05 DIAGNOSIS — I2510 Atherosclerotic heart disease of native coronary artery without angina pectoris: Secondary | ICD-10-CM

## 2020-06-05 DIAGNOSIS — M25552 Pain in left hip: Secondary | ICD-10-CM | POA: Diagnosis not present

## 2020-06-05 MED ORDER — PERFLUTREN LIPID MICROSPHERE
4.0000 mL | INTRAVENOUS | Status: AC | PRN
  Administered 2020-06-05: 4 mL via INTRAVENOUS

## 2020-06-07 DIAGNOSIS — M25552 Pain in left hip: Secondary | ICD-10-CM | POA: Diagnosis not present

## 2020-06-15 LAB — ECHOCARDIOGRAM STRESS TEST
Area-P 1/2: 2.63 cm2
S' Lateral: 3.5 cm

## 2020-08-22 ENCOUNTER — Other Ambulatory Visit (INDEPENDENT_AMBULATORY_CARE_PROVIDER_SITE_OTHER): Payer: BC Managed Care – PPO

## 2020-08-22 ENCOUNTER — Encounter: Payer: Self-pay | Admitting: Family Medicine

## 2020-08-22 ENCOUNTER — Other Ambulatory Visit: Payer: Self-pay

## 2020-08-22 ENCOUNTER — Ambulatory Visit (INDEPENDENT_AMBULATORY_CARE_PROVIDER_SITE_OTHER): Payer: BC Managed Care – PPO | Admitting: Family Medicine

## 2020-08-22 VITALS — BP 118/78 | HR 56 | Temp 97.5°F | Ht 68.0 in | Wt 205.1 lb

## 2020-08-22 DIAGNOSIS — E890 Postprocedural hypothyroidism: Secondary | ICD-10-CM | POA: Diagnosis not present

## 2020-08-22 DIAGNOSIS — E039 Hypothyroidism, unspecified: Secondary | ICD-10-CM

## 2020-08-22 DIAGNOSIS — R7989 Other specified abnormal findings of blood chemistry: Secondary | ICD-10-CM | POA: Diagnosis not present

## 2020-08-22 DIAGNOSIS — Z0000 Encounter for general adult medical examination without abnormal findings: Secondary | ICD-10-CM

## 2020-08-22 LAB — COMPREHENSIVE METABOLIC PANEL
ALT: 19 U/L (ref 0–35)
AST: 21 U/L (ref 0–37)
Albumin: 3.9 g/dL (ref 3.5–5.2)
Alkaline Phosphatase: 89 U/L (ref 39–117)
BUN: 11 mg/dL (ref 6–23)
CO2: 24 mEq/L (ref 19–32)
Calcium: 9.6 mg/dL (ref 8.4–10.5)
Chloride: 105 mEq/L (ref 96–112)
Creatinine, Ser: 1.03 mg/dL (ref 0.40–1.20)
GFR: 62.89 mL/min (ref >60.00)
Glucose, Bld: 79 mg/dL (ref 70–99)
Potassium: 4.3 mEq/L (ref 3.5–5.1)
Sodium: 137 mEq/L (ref 135–145)
Total Bilirubin: 1.9 mg/dL — ABNORMAL HIGH (ref 0.2–1.2)
Total Protein: 6.3 g/dL (ref 6.0–8.3)

## 2020-08-22 LAB — CBC
HCT: 38.1 % (ref 36.0–46.0)
Hemoglobin: 12.7 g/dL (ref 12.0–15.0)
MCHC: 33.4 g/dL (ref 30.0–36.0)
MCV: 81.9 fl (ref 78.0–100.0)
Platelets: 236 10*3/uL (ref 150.0–400.0)
RBC: 4.65 Mil/uL (ref 3.87–5.11)
RDW: 13.9 % (ref 11.5–15.5)
WBC: 5.5 10*3/uL (ref 4.0–10.5)

## 2020-08-22 LAB — LIPID PANEL
Cholesterol: 147 mg/dL (ref 0–200)
HDL: 47.2 mg/dL (ref >39.00)
LDL Cholesterol: 86 mg/dL (ref 0–99)
NonHDL: 99.6
Total CHOL/HDL Ratio: 3
Triglycerides: 68 mg/dL (ref 0.0–149.0)
VLDL: 13.6 mg/dL (ref 0.0–40.0)

## 2020-08-22 LAB — TSH: TSH: 0.16 u[IU]/mL — ABNORMAL LOW (ref 0.35–5.50)

## 2020-08-22 NOTE — Progress Notes (Signed)
Chief Complaint  Patient presents with   Annual Exam     Well Woman [name] is here for a complete physical.   Her last physical was >1 year ago.  Current diet: in general, a "healthy" diet. Current exercise: running, HIIT. Weight is stable and she denies fatigue out of ordinary. Seatbelt? Yes  Health Maintenance Pap/HPV- Yes Colon cancer screening-Yes Shingrix- Yes Tetanus- Yes Hep C screening- Yes HIV screening- Yes Mammogram-+hx of breast cancer, following w oncology team  Past Medical History:  Diagnosis Date   Allergy    Arthritis    Blood transfusion without reported diagnosis    breast cancer 2016   Breast cancer ([place]) 12/2014   Seeing [facility] doctors   CAD (coronary artery disease)    open heart surgery 09/12/10   Cancer ([place]) 2007   thyroid   Family hx of colon cancer    GERD (gastroesophageal reflux disease)    Gilbert disease    Heart disease    History of thyroid cancer    Hypothyroid    Overweight(278.02)    PONV (postoperative nausea and vomiting)      Past Surgical History:  Procedure Laterality Date   arm surgery     rt   BILATERAL TOTAL MASTECTOMY WITH AXILLARY LYMPH NODE DISSECTION     coronary artery bypass grafting x1  09/13/10   [place] [facility]   HERNIA REPAIR  1977   inguinal, ?side   KNEE ARTHROSCOPY Right 09/13/2015   pt reported   [place], 2002    Medications  Current Outpatient Medications on File Prior to Visit  Medication Sig Dispense Refill   levonorgestrel (MIRENA) 20 MCG/24HR IUD 1 each by Intrauterine route once.     levothyroxine (SYNTHROID) 150 MCG tablet Take 1 tablet (150 mcg total) by mouth every morning. 90 tablet 3   metoprolol succinate (TOPROL XL) 25 MG 24 hr tablet Take 1 tablet (25 mg total) by mouth daily. 90 tablet 3   omeprazole (PRILOSEC) 10 MG capsule Take 1 capsule (10 mg total) by mouth 2 (two) times daily. 180 capsule 2    Allergies No Known  Allergies  Review of Systems: Constitutional:  no unexpected weight changes Eye:  no recent significant change in vision Ear/Nose/Mouth/Throat:  Ears:  no recent change in hearing Nose/Mouth/Throat:  no complaints of nasal congestion, no sore throat Cardiovascular: no chest pain Respiratory:  no shortness of breath Gastrointestinal:  no abdominal pain, no change in bowel habits GU:  Female: negative for dysuria or pelvic pain Musculoskeletal/Extremities:  no new pain of the joints Integumentary (Skin/Breast):  no abnormal skin lesions reported Neurologic:  no headaches Endocrine:  denies fatigue  Exam BP 118/78   Pulse (!) 56   Temp (!) 97.5 F (36.4 C) (Oral)   Ht '5\' 8"'$  (1.727 m)   Wt 205 lb 2 oz (93 kg)   SpO2 98%   BMI 31.19 kg/m  General:  well developed, well nourished, in no apparent distress Skin:  no significant moles, warts, or growths Head:  no masses, lesions, or tenderness Eyes:  pupils equal and round, sclera anicteric without injection Ears:  canals without lesions, TMs shiny without retraction, no obvious effusion, no erythema Nose:  nares patent, septum midline, mucosa normal, and no drainage or sinus tenderness Throat/Pharynx:  lips and gingiva without lesion; tongue and uvula midline; non-inflamed pharynx; no exudates or postnasal drainage Neck: neck supple without adenopathy, thyromegaly, or masses  Lungs:  clear to auscultation, breath sounds equal bilaterally, no respiratory distress Cardio:  regular rhythm, bradycardic, no LE edema Abdomen:  abdomen soft, nontender; bowel sounds normal; no masses or organomegaly Genital: Defer to GYN Musculoskeletal:  symmetrical muscle groups noted without atrophy or deformity Extremities:  no clubbing, cyanosis, or edema, no deformities, no skin discoloration Neuro:  gait normal; deep tendon reflexes normal and symmetric Psych: well oriented with normal range of affect and appropriate judgment/insight  Assessment and  Plan  Well adult exam - Plan: CBC, Comprehensive metabolic panel, Lipid panel  Postoperative hypothyroidism - Plan: TSH   Well 50 y.o. female. Counseled on diet and exercise. Other orders as above. Follow up in 6 mo or prn. The patient voiced understanding and agreement to the plan.  [place], DO 08/22/20 7:29 AM

## 2020-08-22 NOTE — Patient Instructions (Signed)
Give us 2-3 business days to get the results of your labs back.   Keep the diet clean and stay active.  I recommend getting the flu shot in mid October. This suggestion would change if the CDC comes out with a different recommendation.   Let us know if you need anything. 

## 2020-08-23 LAB — T4, FREE: Free T4: 1.37 ng/dL (ref 0.60–1.60)

## 2020-09-03 ENCOUNTER — Other Ambulatory Visit: Payer: Self-pay | Admitting: Family Medicine

## 2020-09-03 DIAGNOSIS — E890 Postprocedural hypothyroidism: Secondary | ICD-10-CM

## 2020-09-12 ENCOUNTER — Ambulatory Visit: Payer: BC Managed Care – PPO | Admitting: Family Medicine

## 2020-10-12 ENCOUNTER — Other Ambulatory Visit (HOSPITAL_COMMUNITY)
Admission: RE | Admit: 2020-10-12 | Discharge: 2020-10-12 | Disposition: A | Discharge status: Home / Self Care | Payer: BC Managed Care – PPO | Source: Ambulatory Visit | Attending: Family Medicine | Admitting: Family Medicine

## 2020-10-12 ENCOUNTER — Ambulatory Visit (INDEPENDENT_AMBULATORY_CARE_PROVIDER_SITE_OTHER): Payer: BC Managed Care – PPO | Admitting: Family Medicine

## 2020-10-12 ENCOUNTER — Other Ambulatory Visit: Payer: Self-pay

## 2020-10-12 ENCOUNTER — Encounter: Payer: Self-pay | Admitting: Family Medicine

## 2020-10-12 VITALS — BP 114/70 | HR 61 | Ht 68.0 in | Wt 201.0 lb

## 2020-10-12 DIAGNOSIS — Z975 Presence of (intrauterine) contraceptive device: Secondary | ICD-10-CM | POA: Diagnosis not present

## 2020-10-12 DIAGNOSIS — Z1509 Genetic susceptibility to other malignant neoplasm: Secondary | ICD-10-CM | POA: Diagnosis not present

## 2020-10-12 DIAGNOSIS — Z85850 Personal history of malignant neoplasm of thyroid: Secondary | ICD-10-CM

## 2020-10-12 DIAGNOSIS — Z01419 Encounter for gynecological examination (general) (routine) without abnormal findings: Secondary | ICD-10-CM | POA: Diagnosis not present

## 2020-10-12 DIAGNOSIS — E890 Postprocedural hypothyroidism: Secondary | ICD-10-CM

## 2020-10-12 DIAGNOSIS — D0511 Intraductal carcinoma in situ of right breast: Secondary | ICD-10-CM

## 2020-10-12 NOTE — Progress Notes (Signed)
GYNECOLOGY ANNUAL PREVENTATIVE CARE ENCOUNTER NOTE  Subjective:   [name] is a 50 y.o. No obstetric history on file. female here for a routine annual gynecologic exam.  Current complaints: none. Did have period a month ago. Period before that was 8 months ago.  Denies abnormal vaginal bleeding, discharge, pelvic pain, problems with intercourse or other gynecologic concerns.    Gynecologic History No LMP recorded. (Menstrual status: IUD). Patient is sexually active  Contraception: IUD Last Pap: 2020. Results were: normal Last mammogram: s/p bilateral mastectomy. Colorectal Cancer Screening: 02/2019.   Obstetric History OB History  No obstetric history on file.    Past Medical History:  Diagnosis Date   Allergy    Arthritis    Blood transfusion without reported diagnosis    breast cancer 2016   Breast cancer ([place]) 12/2014   Seeing [facility] doctors   CAD (coronary artery disease)    open heart surgery 09/12/10   Cancer ([place]) 2007   thyroid   Family hx of colon cancer    GERD (gastroesophageal reflux disease)    Gilbert disease    Heart disease    History of thyroid cancer    Hypothyroid    Overweight(278.02)    PONV (postoperative nausea and vomiting)     Past Surgical History:  Procedure Laterality Date   arm surgery     rt   BILATERAL TOTAL MASTECTOMY WITH AXILLARY LYMPH NODE DISSECTION     coronary artery bypass grafting x1  09/13/10   [place] [facility]   HERNIA REPAIR  1977   inguinal, ?side   KNEE ARTHROSCOPY Right 09/13/2015   pt reported   [place], 2002    Current Outpatient Medications on File Prior to Visit  Medication Sig Dispense Refill   levonorgestrel (MIRENA) 20 MCG/24HR IUD 1 each by Intrauterine route once.     levothyroxine (SYNTHROID) 150 MCG tablet TAKE 1 TABLET(150 MCG) BY MOUTH EVERY MORNING 90 tablet 3   metoprolol succinate (TOPROL XL) 25 MG 24 hr tablet Take 1 tablet (25 mg total) by mouth  daily. 90 tablet 3   omeprazole (PRILOSEC) 10 MG capsule Take 1 capsule (10 mg total) by mouth 2 (two) times daily. 180 capsule 2   No current facility-administered medications on file prior to visit.    No Known Allergies  Social History   Socioeconomic History   Marital status: Married    Spouse name: Not on file   Number of children: 2   Years of education: 16   Highest education level: Not on file  Occupational History   Occupation: IT    Employer: [employer]  Tobacco Use   Smoking status: Never   Smokeless tobacco: Never  Vaping Use   Vaping Use: Never used  Substance and Sexual Activity   Alcohol use: Yes    Comment: rarely   Drug use: No   Sexual activity: Yes    Birth control/protection: I.U.D.  Other Topics Concern   Not on file  Social History Narrative   Regular exercise-yes   Caffeine Use-yes   Works as Patent examiner   [id number]- son   Married   Enjoys Marine scientist and a Scientist, research (physical sciences) degree   Social Determinants of Radio broadcast assistant Strain: Not on [employer] Insecurity: Not on file  Transportation Needs: Not on file  Physical Activity: Not on  file  Stress: Not on file  Social Connections: Not on file  Intimate Partner Violence: Not on file    Family History  Problem Relation Age of Onset   Hypertension Mother    Colon polyps Mother    Arthritis Mother    Cancer Mother        breast   Breast cancer Mother    Hypertension Father    Hiatal hernia Father    Hypertension Sister    Lupus Maternal Aunt    Heart disease Maternal Aunt    Diabetes Maternal Aunt    Arthritis Paternal Grandmother    Diabetes Paternal Grandmother    Parkinson's disease Paternal Grandmother    Coronary artery disease Maternal Grandfather    Cancer Maternal Aunt        breast, ovarian   Breast cancer Maternal Aunt    Diabetes Maternal Aunt    Other Maternal Aunt        ?auto immune disease   Colon cancer  Maternal Uncle    Esophageal cancer Neg Hx    Rectal cancer Neg Hx    Stomach cancer Neg Hx     The following portions of the patient's history were reviewed and updated as appropriate: allergies, current medications, past family history, past medical history, past social history, past surgical history and problem list.  Review of Systems Pertinent items are noted in HPI.   Objective:  BP 114/70   Pulse 61   Ht 5\' 8"  (1.727 m)   Wt 201 lb (91.2 kg)   BMI 30.56 kg/m  Wt Readings from Last 3 Encounters:  10/12/20 201 lb (91.2 kg)  08/22/20 205 lb 2 oz (93 kg)  02/22/20 205 lb 4 oz (93.1 kg)     Chaperone present during exam  CONSTITUTIONAL: Well-developed, well-nourished female in no acute distress.  HENT:  Normocephalic, atraumatic, External right and left ear normal. Oropharynx is clear and moist EYES: Conjunctivae and EOM are normal. Pupils are equal, round, and reactive to light. No scleral icterus.  NECK: Normal range of motion, supple, no masses.  Normal thyroid.   CARDIOVASCULAR: Normal heart rate noted, regular rhythm RESPIRATORY: Clear to auscultation bilaterally. Effort and breath sounds normal, no problems with respiration noted. BREASTS: Symmetric in size. No masses, skin changes, nipple drainage, or lymphadenopathy. ABDOMEN: Soft, normal bowel sounds, no distention noted.  No tenderness, rebound or guarding.  PELVIC: Normal appearing external genitalia; normal appearing vaginal mucosa and cervix.  No abnormal discharge noted. Strings visualized. MUSCULOSKELETAL: Normal range of motion. No tenderness.  No cyanosis, clubbing, or edema.  2+ distal pulses. SKIN: Skin is warm and dry. No rash noted. Not diaphoretic. No erythema. No pallor. NEUROLOGIC: Alert and oriented to person, place, and time. Normal reflexes, muscle tone coordination. No cranial nerve deficit noted. PSYCHIATRIC: Normal mood and affect. Normal behavior. Normal judgment and thought  content.  Assessment:  Annual gynecologic examination with pap smear   Plan:  1. Well Woman Exam Will follow up results of pap smear and manage accordingly. STD testing discussed. Patient declined testing  2. Ductal carcinoma in situ (DCIS) of right breast S/p bilateral mastectomy. Followed by oncology  3. IUD (intrauterine device) in place Strings seen  4. Genetic predisposition to cancer MRI for pancreatic cancer screening.  5. History of thyroid cancer  6. Postoperative hypothyroidism On synthroid   Routine preventative health maintenance measures emphasized. Please refer to After Visit Summary for other counseling recommendations.    Loma [place], [place]  for [employer]

## 2020-10-15 LAB — CYTOLOGY - PAP
Comment: NEGATIVE
Diagnosis: NEGATIVE
High risk HPV: NEGATIVE

## 2020-11-09 DIAGNOSIS — I2510 Atherosclerotic heart disease of native coronary artery without angina pectoris: Secondary | ICD-10-CM

## 2020-11-09 DIAGNOSIS — E782 Mixed hyperlipidemia: Secondary | ICD-10-CM

## 2020-11-09 DIAGNOSIS — Z8249 Family history of ischemic heart disease and other diseases of the circulatory system: Secondary | ICD-10-CM

## 2020-11-09 DIAGNOSIS — I10 Essential (primary) hypertension: Secondary | ICD-10-CM

## 2020-11-09 NOTE — Telephone Encounter (Signed)
"  Sherren Mocha, MD to Niel Hummer A     11:32 AM Let's do the MRI of the chest when you come in next for your abdominal MRI. Reach out to me before you have the MRI and we will put in orders for the chest to be done at the same time.    Thx and let me know if you have any other concerns.  Ronalee Belts"  Found previous note in [place]. Will forward to Dr. Burt Knack to see if order can be placed and what the dx for the order is.

## 2020-11-11 NOTE — Telephone Encounter (Signed)
Please order chest MRA. Dx: spontaneous coronary artery dissection, family history of aortic dissection

## 2020-11-12 ENCOUNTER — Other Ambulatory Visit: Payer: Self-pay

## 2020-11-12 ENCOUNTER — Telehealth: Payer: Self-pay | Admitting: Cardiovascular Disease

## 2020-11-12 ENCOUNTER — Other Ambulatory Visit: Payer: BC Managed Care – PPO | Admitting: *Deleted

## 2020-11-12 DIAGNOSIS — I10 Essential (primary) hypertension: Secondary | ICD-10-CM | POA: Diagnosis not present

## 2020-11-12 DIAGNOSIS — I2510 Atherosclerotic heart disease of native coronary artery without angina pectoris: Secondary | ICD-10-CM | POA: Diagnosis not present

## 2020-11-12 DIAGNOSIS — Z8249 Family history of ischemic heart disease and other diseases of the circulatory system: Secondary | ICD-10-CM

## 2020-11-12 NOTE — Telephone Encounter (Signed)
   [name] with [facility] radiology called, she said the MRI that was ordered was wrong based on Dr. Antionette Char notes. She said she can take a verbal order from [place] and she can change it, she said she will be at the office today until 3 pm [phone]

## 2020-11-12 NOTE — Telephone Encounter (Signed)
Verbal order adjusted

## 2020-11-12 NOTE — Addendum Note (Signed)
Addended by: Loren Racer on: 11/12/2020 09:30 AM   Modules accepted: Orders

## 2020-11-13 LAB — CBC
Hematocrit: 36.4 % (ref 34.0–46.6)
Hemoglobin: 12.1 g/dL (ref 11.1–15.9)
MCH: 26.2 pg — ABNORMAL LOW (ref 26.6–33.0)
MCHC: 33.2 g/dL (ref 31.5–35.7)
MCV: 79 fL (ref 79–97)
Platelets: 270 10*3/uL (ref 150–450)
RBC: 4.61 x10E6/uL (ref 3.77–5.28)
RDW: 12.8 % (ref 11.7–15.4)
WBC: 7.5 10*3/uL (ref 3.4–10.8)

## 2020-11-13 LAB — BASIC METABOLIC PANEL
BUN/Creatinine Ratio: 17 (ref 9–23)
BUN: 14 mg/dL (ref 6–24)
CO2: 23 mmol/L (ref 20–29)
Calcium: 10.3 mg/dL — ABNORMAL HIGH (ref 8.7–10.2)
Chloride: 105 mmol/L (ref 96–106)
Creatinine, Ser: 0.82 mg/dL (ref 0.57–1.00)
Glucose: 88 mg/dL (ref 70–99)
Potassium: 4.2 mmol/L (ref 3.5–5.2)
Sodium: 141 mmol/L (ref 134–144)
eGFR: 87 mL/min/{1.73_m2} (ref >59)

## 2020-11-20 ENCOUNTER — Other Ambulatory Visit: Payer: Self-pay | Admitting: Oncology

## 2020-11-20 ENCOUNTER — Other Ambulatory Visit: Payer: Self-pay

## 2020-11-20 ENCOUNTER — Encounter (HOSPITAL_COMMUNITY): Payer: Self-pay

## 2020-11-20 ENCOUNTER — Ambulatory Visit (HOSPITAL_COMMUNITY)
Admission: RE | Admit: 2020-11-20 | Discharge: 2020-11-20 | Disposition: A | Discharge status: Home / Self Care | Payer: BC Managed Care – PPO | Source: Ambulatory Visit | Attending: Cardiovascular Disease | Admitting: Cardiovascular Disease

## 2020-11-20 ENCOUNTER — Ambulatory Visit (HOSPITAL_COMMUNITY)
Admission: RE | Admit: 2020-11-20 | Discharge: 2020-11-20 | Disposition: A | Discharge status: Home / Self Care | Payer: BC Managed Care – PPO | Source: Ambulatory Visit | Attending: Oncology | Admitting: Oncology

## 2020-11-20 DIAGNOSIS — D0511 Intraductal carcinoma in situ of right breast: Secondary | ICD-10-CM | POA: Insufficient documentation

## 2020-11-20 DIAGNOSIS — Z85850 Personal history of malignant neoplasm of thyroid: Secondary | ICD-10-CM | POA: Diagnosis not present

## 2020-11-20 DIAGNOSIS — Z1501 Genetic susceptibility to malignant neoplasm of breast: Secondary | ICD-10-CM

## 2020-11-20 DIAGNOSIS — Z1509 Genetic susceptibility to other malignant neoplasm: Secondary | ICD-10-CM

## 2020-11-20 DIAGNOSIS — E890 Postprocedural hypothyroidism: Secondary | ICD-10-CM | POA: Diagnosis not present

## 2020-11-20 DIAGNOSIS — K8020 Calculus of gallbladder without cholecystitis without obstruction: Secondary | ICD-10-CM | POA: Diagnosis not present

## 2020-11-20 DIAGNOSIS — Z853 Personal history of malignant neoplasm of breast: Secondary | ICD-10-CM | POA: Diagnosis not present

## 2020-11-20 DIAGNOSIS — C73 Malignant neoplasm of thyroid gland: Secondary | ICD-10-CM | POA: Insufficient documentation

## 2020-11-20 DIAGNOSIS — G4733 Obstructive sleep apnea (adult) (pediatric): Secondary | ICD-10-CM

## 2020-11-20 DIAGNOSIS — Z8249 Family history of ischemic heart disease and other diseases of the circulatory system: Secondary | ICD-10-CM | POA: Diagnosis not present

## 2020-11-20 MED ORDER — GADOBUTROL 1 MMOL/ML IV SOLN
10.0000 mL | Freq: Once | INTRAVENOUS | Status: AC | PRN
  Administered 2020-11-20: 9 mL via INTRAVENOUS

## 2020-11-21 ENCOUNTER — Encounter: Payer: Self-pay | Admitting: Oncology

## 2020-11-25 ENCOUNTER — Ambulatory Visit (HOSPITAL_COMMUNITY)
Admission: RE | Admit: 2020-11-25 | Discharge: 2020-11-25 | Disposition: A | Discharge status: Home / Self Care | Payer: BC Managed Care – PPO | Source: Ambulatory Visit | Attending: Cardiovascular Disease | Admitting: Cardiovascular Disease

## 2020-11-25 DIAGNOSIS — Z8679 Personal history of other diseases of the circulatory system: Secondary | ICD-10-CM | POA: Diagnosis not present

## 2020-11-25 DIAGNOSIS — R918 Other nonspecific abnormal finding of lung field: Secondary | ICD-10-CM | POA: Diagnosis not present

## 2020-11-25 DIAGNOSIS — Z8249 Family history of ischemic heart disease and other diseases of the circulatory system: Secondary | ICD-10-CM | POA: Insufficient documentation

## 2020-11-25 MED ORDER — GADOBUTROL 1 MMOL/ML IV SOLN
9.0000 mL | Freq: Once | INTRAVENOUS | Status: AC | PRN
  Administered 2020-11-25: 9 mL via INTRAVENOUS

## 2020-11-27 ENCOUNTER — Inpatient Hospital Stay: Payer: BC Managed Care – PPO

## 2020-11-27 ENCOUNTER — Inpatient Hospital Stay: Payer: BC Managed Care – PPO | Attending: Oncology | Admitting: Oncology

## 2020-11-27 ENCOUNTER — Other Ambulatory Visit: Payer: Self-pay

## 2020-11-27 VITALS — BP 133/84 | HR 60 | Temp 97.9°F | Resp 18 | Wt 198.3 lb

## 2020-11-27 DIAGNOSIS — Z8371 Family history of colonic polyps: Secondary | ICD-10-CM | POA: Diagnosis not present

## 2020-11-27 DIAGNOSIS — Z8379 Family history of other diseases of the digestive system: Secondary | ICD-10-CM | POA: Diagnosis not present

## 2020-11-27 DIAGNOSIS — I2510 Atherosclerotic heart disease of native coronary artery without angina pectoris: Secondary | ICD-10-CM | POA: Diagnosis not present

## 2020-11-27 DIAGNOSIS — Z1501 Genetic susceptibility to malignant neoplasm of breast: Secondary | ICD-10-CM | POA: Diagnosis not present

## 2020-11-27 DIAGNOSIS — C73 Malignant neoplasm of thyroid gland: Secondary | ICD-10-CM

## 2020-11-27 DIAGNOSIS — Z8507 Personal history of malignant neoplasm of pancreas: Secondary | ICD-10-CM | POA: Diagnosis not present

## 2020-11-27 DIAGNOSIS — Z8041 Family history of malignant neoplasm of ovary: Secondary | ICD-10-CM | POA: Insufficient documentation

## 2020-11-27 DIAGNOSIS — Z9013 Acquired absence of bilateral breasts and nipples: Secondary | ICD-10-CM | POA: Insufficient documentation

## 2020-11-27 DIAGNOSIS — E039 Hypothyroidism, unspecified: Secondary | ICD-10-CM | POA: Insufficient documentation

## 2020-11-27 DIAGNOSIS — Z1509 Genetic susceptibility to other malignant neoplasm: Secondary | ICD-10-CM

## 2020-11-27 DIAGNOSIS — K8020 Calculus of gallbladder without cholecystitis without obstruction: Secondary | ICD-10-CM | POA: Diagnosis not present

## 2020-11-27 DIAGNOSIS — Z818 Family history of other mental and behavioral disorders: Secondary | ICD-10-CM | POA: Insufficient documentation

## 2020-11-27 DIAGNOSIS — G4733 Obstructive sleep apnea (adult) (pediatric): Secondary | ICD-10-CM

## 2020-11-27 DIAGNOSIS — Z85850 Personal history of malignant neoplasm of thyroid: Secondary | ICD-10-CM | POA: Diagnosis not present

## 2020-11-27 DIAGNOSIS — Z800 Family history of malignant neoplasm of digestive organs: Secondary | ICD-10-CM | POA: Insufficient documentation

## 2020-11-27 DIAGNOSIS — D0511 Intraductal carcinoma in situ of right breast: Secondary | ICD-10-CM

## 2020-11-27 DIAGNOSIS — Z833 Family history of diabetes mellitus: Secondary | ICD-10-CM | POA: Insufficient documentation

## 2020-11-27 DIAGNOSIS — Z8261 Family history of arthritis: Secondary | ICD-10-CM | POA: Diagnosis not present

## 2020-11-27 DIAGNOSIS — Z803 Family history of malignant neoplasm of breast: Secondary | ICD-10-CM | POA: Insufficient documentation

## 2020-11-27 DIAGNOSIS — Z79899 Other long term (current) drug therapy: Secondary | ICD-10-CM | POA: Diagnosis not present

## 2020-11-27 DIAGNOSIS — Z793 Long term (current) use of hormonal contraceptives: Secondary | ICD-10-CM | POA: Insufficient documentation

## 2020-11-27 DIAGNOSIS — Q999 Chromosomal abnormality, unspecified: Secondary | ICD-10-CM

## 2020-11-27 DIAGNOSIS — K219 Gastro-esophageal reflux disease without esophagitis: Secondary | ICD-10-CM | POA: Diagnosis not present

## 2020-11-27 DIAGNOSIS — Z8249 Family history of ischemic heart disease and other diseases of the circulatory system: Secondary | ICD-10-CM | POA: Insufficient documentation

## 2020-11-27 LAB — CBC WITH DIFFERENTIAL/PLATELET
Abs Immature Granulocytes: 0.02 10*3/uL (ref 0.00–0.07)
Basophils Absolute: 0.1 10*3/uL (ref 0.0–0.1)
Basophils Relative: 1 %
Eosinophils Absolute: 0 10*3/uL (ref 0.0–0.5)
Eosinophils Relative: 0 %
HCT: 37 % (ref 36.0–46.0)
Hemoglobin: 12.1 g/dL (ref 12.0–15.0)
Immature Granulocytes: 0 %
Lymphocytes Relative: 23 %
Lymphs Abs: 1.8 10*3/uL (ref 0.7–4.0)
MCH: 26.2 pg (ref 26.0–34.0)
MCHC: 32.7 g/dL (ref 30.0–36.0)
MCV: 80.1 fL (ref 80.0–100.0)
Monocytes Absolute: 0.6 10*3/uL (ref 0.1–1.0)
Monocytes Relative: 7 %
Neutro Abs: 5.4 10*3/uL (ref 1.7–7.7)
Neutrophils Relative %: 69 %
Platelets: 270 10*3/uL (ref 150–400)
RBC: 4.62 MIL/uL (ref 3.87–5.11)
RDW: 13.1 % (ref 11.5–15.5)
WBC: 7.8 10*3/uL (ref 4.0–10.5)
nRBC: 0 % (ref 0.0–0.2)

## 2020-11-27 LAB — COMPREHENSIVE METABOLIC PANEL
ALT: 28 U/L (ref 0–44)
AST: 31 U/L (ref 15–41)
Albumin: 4 g/dL (ref 3.5–5.0)
Alkaline Phosphatase: 119 U/L (ref 38–126)
Anion gap: 8 (ref 5–15)
BUN: 17 mg/dL (ref 6–20)
CO2: 24 mmol/L (ref 22–32)
Calcium: 9.8 mg/dL (ref 8.9–10.3)
Chloride: 108 mmol/L (ref 98–111)
Creatinine, Ser: 0.98 mg/dL (ref 0.44–1.00)
GFR, Estimated: >60 mL/min (ref >60)
Glucose, Bld: 89 mg/dL (ref 70–99)
Potassium: 4.1 mmol/L (ref 3.5–5.1)
Sodium: 140 mmol/L (ref 135–145)
Total Bilirubin: 0.8 mg/dL (ref 0.3–1.2)
Total Protein: 7.2 g/dL (ref 6.5–8.1)

## 2020-11-27 NOTE — Progress Notes (Signed)
[place]  Telephone:(336) [phone] Fax:(336) [phone]     ID: MYA SUELL DOB: 1970/10/16  MR#: [id number]  QPR#:[id number]  Patient Care Team: Shelda Pal, DO as PCP - General (Family Medicine) Kathyrn Lass, MD (Family Medicine) Rolm Bookbinder, MD as Consulting Physician (General Surgery) Irene Limbo, MD as Consulting Physician (Plastic Surgery) Sherren Mocha, MD as Consulting Physician (Cardiology) [place], Jennefer Bravo, MD as Referring Physician (Dermatology) Jacinto Reap, MD (Plastic Surgery) Jola Schmidt, MD as Consulting Physician (Ophthalmology) OTHER MD:  CHIEF COMPLAINT: Right-sided ductal carcinoma in situ (s/p bilateral mastectomies)  CURRENT TREATMENT: Screening for melanoma and pancreatic cancer in the setting of a deleterious CDKN2A mutation   INTERVAL HISTORY: Hayli returns today for follow-up of her high-risk genetic mutation.   The patient continues screening for melanoma and pancreatic cancer in the setting of CDKN2A mutation. She sees her dermatologist, Dr. Renda Rolls, for melanoma skin screening twice a year and she sees Dr. Valetta Close for funduscopic exams  Since her last visit, she underwent follow up abdomen MRI on 11/20/2020 showing: no evidence of pancreatic mass or other suspicious abnormality.  She also has a history of spontaneous coronary artery, and a family history of aortic dissection. MRI angio of the chest was obtained by her cardiology Dr. Burt Knack and this was entirely normal   REVIEW OF SYSTEMS: Bertine feels she is a little overweight.  She does exercise vigorously and goes running frequently with her husband and children.  Incidentally her husband Aaron Edelman has had a couple of "fainting spells" of unclear etiology while running and this is being currently worked up.  A detailed review of systems today was otherwise noncontributory  COVID 19 VACCINATION STATUS: Pfizer x4 as of November  2022   BREAST CANCER HISTORY: From the prior intake note:  Pamula had bilateral screening mammography at a mobile clinic 11/16/2014. This showed a focal density in the lower inner quadrant of the right breast and she was recalled for diagnostic right mammography and right breast ultrasonography at the [facility] 11/28/2014. This showed the breast density to be category B. In the lower inner right breast there was a group of amorphous calcifications spanning approximately 3 cm. These were new as compared to mammography from 2014. Ultrasound of the area showed an ill-defined region of hypoechoic tissue but no definite mass.  Biopsy of the right breast mass in question 12/05/2014 showed ([id number]) ductal carcinoma in situ, grade 1, arising within an intraductal papilloma. The cells were estrogen receptor 9200% positive, progesterone receptor 80-90% positive, both with strong staining intensity.  The patient's subsequent history is as detailed below.   PAST MEDICAL HISTORY: Past Medical History:  Diagnosis Date   Allergy    Arthritis    Blood transfusion without reported diagnosis    breast cancer 2016   Breast cancer ([place]) 12/2014   Seeing [facility] doctors   CAD (coronary artery disease)    open heart surgery 09/12/10   Cancer ([place]) 2007   thyroid   Family hx of colon cancer    GERD (gastroesophageal reflux disease)    Gilbert disease    Heart disease    History of thyroid cancer    Hypothyroid    Overweight(278.02)    PONV (postoperative nausea and vomiting)     PAST SURGICAL HISTORY: Past Surgical History:  Procedure Laterality Date   arm surgery     rt   BILATERAL TOTAL MASTECTOMY WITH AXILLARY LYMPH NODE DISSECTION     coronary artery bypass  grafting x1  09/13/10   [place] [facility]   HERNIA REPAIR  1977   inguinal, ?side   KNEE ARTHROSCOPY Right 09/13/2015   pt reported   [place] Right 1989, 2002    FAMILY HISTORY Family  History  Problem Relation Age of Onset   Hypertension Mother    Colon polyps Mother    Arthritis Mother    Cancer Mother        breast   Breast cancer Mother    Hypertension Father    Hiatal hernia Father    Hypertension Sister    Lupus Maternal Aunt    Heart disease Maternal Aunt    Diabetes Maternal Aunt    Arthritis Paternal Grandmother    Diabetes Paternal Grandmother    Parkinson's disease Paternal Grandmother    Coronary artery disease Maternal Grandfather    Cancer Maternal Aunt        breast, ovarian   Breast cancer Maternal Aunt    Diabetes Maternal Aunt    Other Maternal Aunt        ?auto immune disease   Colon cancer Maternal Uncle    Esophageal cancer Neg Hx    Rectal cancer Neg Hx    Stomach cancer Neg Hx   The patient's parents are in their late 74s as of December 2016. The patient's mother was diagnosed with breast cancer age 50; on the maternal side, the maternal grandmother was diagnosed with uterine cancer at age 24. 2 maternal aunts had one breast cancer and one uterine cancer and one maternal uncle had colon cancer is were in their 89s and 33s. On the father's side there is an uncle with colon cancer age 64. There is no history of ovarian cancer to the patient's knowledge   GYNECOLOGIC HISTORY:  No LMP recorded. (Menstrual status: IUD). Menarche age 75, first live birth age 89. The patient is GX P2. She still having regular periods. She has a Mirena IUD in place.   SOCIAL HISTORY: (Updated November 2020) Keilly works as a Dealer. Her husband [name] is an Brewing technologist. Their son, [name], graduated from [employer] in [place] and is currently working in the [employer] as a Primary school teacher (Emergency planning/management officer) sharing at home with his his significant other.. The patient's daughter, Ander Purpura is currently engaged, living in [place], and working for [employer] here in town.  The patient has no grandchildren   ADVANCED  DIRECTIVES: In the absence of any documentation to the contrary, the patient's spouse is their HCPOA.    HEALTH MAINTENANCE: Social History   Tobacco Use   Smoking status: Never   Smokeless tobacco: Never  Vaping Use   Vaping Use: Never used  Substance Use Topics   Alcohol use: Yes    Comment: rarely   Drug use: No     Colonoscopy: 02/2019 (Dr. Fuller Plan), repeat due 2024  PAP: 06/2018, negative  Bone density: none on file   Lipid panel:  No Known Allergies  Current Outpatient Medications  Medication Sig Dispense Refill   levonorgestrel (MIRENA) 20 MCG/24HR IUD 1 each by Intrauterine route once.     levothyroxine (SYNTHROID) 150 MCG tablet TAKE 1 TABLET(150 MCG) BY MOUTH EVERY MORNING 90 tablet 3   metoprolol succinate (TOPROL XL) 25 MG 24 hr tablet Take 1 tablet (25 mg total) by mouth daily. 90 tablet 3   omeprazole (PRILOSEC) 10 MG capsule Take 1 capsule (10 mg total) by mouth 2 (  two) times daily. 180 capsule 2   No current facility-administered medications for this visit.    OBJECTIVE: White woman who appears younger than stated age 24:   11/27/20 1536  BP: 133/84  Pulse: 60  Resp: 18  Temp: 97.9 F (36.6 C)  SpO2: 100%      Body mass index is 30.15 kg/m.    ECOG FS:1 - Symptomatic but completely ambulatory  Sclerae unicteric, EOMs intact Wearing a mask No cervical or supraclavicular adenopathy Lungs no rales or rhonchi Heart regular rate and rhythm Abd soft, nontender, positive bowel sounds MSK no focal spinal tenderness, no upper extremity lymphedema Neuro: nonfocal, well oriented, appropriate affect Breasts: Status post bilateral mastectomies with bilateral DIEP reconstruction.  There are no findings of concern.  Both axillae are benign   LAB RESULTS:  CMP     Component Value Date/Time   NA 140 11/27/2020 1523   NA 141 11/12/2020 1257   NA 139 10/21/2016 0846   K 4.1 11/27/2020 1523   K 4.2 10/21/2016 0846   CL 108 11/27/2020 1523   CO2 24  11/27/2020 1523   CO2 24 10/21/2016 0846   GLUCOSE 89 11/27/2020 1523   GLUCOSE 91 10/21/2016 0846   BUN 17 11/27/2020 1523   BUN 14 11/12/2020 1257   BUN 10.1 10/21/2016 0846   CREATININE 0.98 11/27/2020 1523   CREATININE 0.94 09/22/2019 0736   CREATININE 1.0 10/21/2016 0846   CALCIUM 9.8 11/27/2020 1523   CALCIUM 9.1 10/21/2016 0846   PROT 7.2 11/27/2020 1523   PROT 6.6 10/21/2016 0846   ALBUMIN 4.0 11/27/2020 1523   ALBUMIN 3.7 10/21/2016 0846   AST 31 11/27/2020 1523   AST 23 12/08/2017 0902   AST 28 10/21/2016 0846   ALT 28 11/27/2020 1523   ALT 19 12/08/2017 0902   ALT 28 10/21/2016 0846   ALKPHOS 119 11/27/2020 1523   ALKPHOS 100 10/21/2016 0846   BILITOT 0.8 11/27/2020 1523   BILITOT 1.9 (H) 12/08/2017 0902   BILITOT 1.18 10/21/2016 0846   GFRNONAA >60 11/27/2020 1523   GFRNONAA >60 12/08/2017 0902   GFRAA >60 12/08/2017 0902    INo results found for: SPEP, UPEP  Lab Results  Component Value Date   WBC 7.8 11/27/2020   NEUTROABS 5.4 11/27/2020   HGB 12.1 11/27/2020   HCT 37.0 11/27/2020   MCV 80.1 11/27/2020   PLT 270 11/27/2020      Chemistry      Component Value Date/Time   NA 140 11/27/2020 1523   NA 141 11/12/2020 1257   NA 139 10/21/2016 0846   K 4.1 11/27/2020 1523   K 4.2 10/21/2016 0846   CL 108 11/27/2020 1523   CO2 24 11/27/2020 1523   CO2 24 10/21/2016 0846   BUN 17 11/27/2020 1523   BUN 14 11/12/2020 1257   BUN 10.1 10/21/2016 0846   CREATININE 0.98 11/27/2020 1523   CREATININE 0.94 09/22/2019 0736   CREATININE 1.0 10/21/2016 0846      Component Value Date/Time   CALCIUM 9.8 11/27/2020 1523   CALCIUM 9.1 10/21/2016 0846   ALKPHOS 119 11/27/2020 1523   ALKPHOS 100 10/21/2016 0846   AST 31 11/27/2020 1523   AST 23 12/08/2017 0902   AST 28 10/21/2016 0846   ALT 28 11/27/2020 1523   ALT 19 12/08/2017 0902   ALT 28 10/21/2016 0846   BILITOT 0.8 11/27/2020 1523   BILITOT 1.9 (H) 12/08/2017 0902   BILITOT 1.18 10/21/2016 0846  No results found for: LABCA2  No components found for: OINOM767  No results for input(s): INR in the last 168 hours.  Urinalysis    Component Value Date/Time   COLORURINE DARK YELLOW 02/20/2015 0705   APPEARANCEUR TURBID (A) 02/20/2015 0705   LABSPEC 1.023 02/20/2015 0705   PHURINE 5.5 02/20/2015 0705   GLUCOSEU NEGATIVE 02/20/2015 0705   GLUCOSEU NEGATIVE 01/30/2015 1428   HGBUR NEGATIVE 02/20/2015 0705   BILIRUBINUR negative 01/16/2020 1512   KETONESUR NEGATIVE 02/20/2015 0705   PROTEINUR Negative 01/16/2020 1512   PROTEINUR NEGATIVE 02/20/2015 0705   UROBILINOGEN 0.2 01/16/2020 1512   UROBILINOGEN 0.2 01/30/2015 1428   NITRITE negative 01/16/2020 1512   NITRITE NEGATIVE 02/20/2015 0705   LEUKOCYTESUR Moderate (2+) (A) 01/16/2020 1512    STUDIES: MR ANGIO CHEST W WO CONTRAST  Result Date: 11/25/2020 CLINICAL DATA:  50 year old female with history of spontaneous coronary artery dissection, family history of aortic dissection. EXAM: MRA Chest with and without contrast TECHNIQUE: Angiographic images of the chest were obtained using MRA technique without and with intravenous contrast. CONTRAST:  Nine mL Gadavist, intravenous COMPARISON:  CT heart from 07/07/2011, MRI abdomen from 01/19/2020 FINDINGS: Cardiovascular: Preferential opacification of the thoracic aorta. No evidence of thoracic aortic aneurysm or dissection. Normal heart size. No pericardial effusion. Sinues of Valsalva: 29 mm 26 x 28 mm Sinotubular Junction: 31 mm Ascending Aorta: 29 mm Aortic Arch: 24 mm Descending aorta: 24 mm at the level of the carina Branch vessels: Conventional branching pattern. No significant atherosclerotic changes. Coronary arteries: Normal origins and courses. No significant atherosclerotic calcifications. Main pulmonary artery: 26 mm. No evidence of central pulmonary embolism. Pulmonary veins: No anomalous pulmonary venous return. No evidence of left atrial appendage thrombus. Upper  abdominal vasculature: Within normal limits. Mediastinum/Nodes: No enlarged mediastinal, hilar, or axillary lymph nodes. Thyroid gland, trachea, and esophagus demonstrate no significant findings. Lungs/Pleura: No focal consolidations. No suspicious pulmonary nodules. No pleural effusion or pneumothorax. Upper Abdomen: The visualized upper abdomen is within normal limits. Musculoskeletal: Multiple well-circumscribed, T2 hyperintense, nonenhancing T1 hypointense rounded foci throughout the T5 through T9 vertebral bodies, similar appearance to comparison studies, compatible with benign hemangiomas. Median sternotomy wires in place. No acute osseous abnormality or suspicious appearing osseous lesion. IMPRESSION: Vascular: Normal caliber and patent thoracic aorta without evidence of dissection. Non-Vascular: No acute thoracic abnormality. Ruthann Cancer, MD [facility] Electronically Signed   By: Ruthann Cancer M.D.   On: 11/25/2020 11:04   MR 3D Recon At Scanner  Result Date: 11/20/2020 CLINICAL DATA:  High risk pancreatic cancer surveillance. History of thyroid cancer, right breast cancer, Gilbert syndrome. EXAM: MRI ABDOMEN WITHOUT AND WITH CONTRAST (INCLUDING MRCP) TECHNIQUE: Multiplanar multisequence MR imaging of the abdomen was performed both before and after the administration of intravenous contrast. Heavily T2-weighted images of the biliary and pancreatic ducts were obtained, and three-dimensional MRCP images were rendered by post processing. CONTRAST:  36mL GADAVIST GADOBUTROL 1 MMOL/ML IV SOLN COMPARISON:  Multiple priors including most recent MRI abdomen January 19, 2020. FINDINGS: Lower chest: Prior median sternotomy and bilateral breast surgery. No acute abnormality. Hepatobiliary: No hepatic steatosis. No suspicious hepatic lesion. Cholelithiasis without findings of acute cholecystitis. No biliary ductal dilation. Pancreas: Intrinsic T1 signal of  the pancreatic parenchyma is within normal limits. No pancreatic ductal dilation. No pancreatic divisum or evidence of long common channel. No cystic or arterially enhancing solid hepatic lesion. Spleen: Mild splenomegaly similar prior measuring 13.2 cm in maximum axial dimension.  Adrenals/Urinary Tract: Bilateral adrenal glands are unremarkable. No hydronephrosis. No solid enhancing renal mass. Stomach/Bowel: Visualized portions within the abdomen are unremarkable. Vascular/Lymphatic: No pathologically enlarged lymph nodes identified. No abdominal aortic aneurysm demonstrated. Other:  No abdominal ascites. Musculoskeletal: No suspicious bone lesions identified. IMPRESSION: 1. No evidence of pancreatic mass or other suspicious abnormality. 2. Cholelithiasis without findings of acute cholecystitis. 3. Stable mild splenomegaly. Electronically Signed   By: Dahlia Bailiff M.D.   On: 11/20/2020 20:48   MR ABDOMEN MRCP W WO CONTAST  Result Date: 11/20/2020 CLINICAL DATA:  High risk pancreatic cancer surveillance. History of thyroid cancer, right breast cancer, Gilbert syndrome. EXAM: MRI ABDOMEN WITHOUT AND WITH CONTRAST (INCLUDING MRCP) TECHNIQUE: Multiplanar multisequence MR imaging of the abdomen was performed both before and after the administration of intravenous contrast. Heavily T2-weighted images of the biliary and pancreatic ducts were obtained, and three-dimensional MRCP images were rendered by post processing. CONTRAST:  80mL GADAVIST GADOBUTROL 1 MMOL/ML IV SOLN COMPARISON:  Multiple priors including most recent MRI abdomen January 19, 2020. FINDINGS: Lower chest: Prior median sternotomy and bilateral breast surgery. No acute abnormality. Hepatobiliary: No hepatic steatosis. No suspicious hepatic lesion. Cholelithiasis without findings of acute cholecystitis. No biliary ductal dilation. Pancreas: Intrinsic T1 signal of the pancreatic parenchyma is within normal limits. No pancreatic ductal dilation. No  pancreatic divisum or evidence of long common channel. No cystic or arterially enhancing solid hepatic lesion. Spleen: Mild splenomegaly similar prior measuring 13.2 cm in maximum axial dimension. Adrenals/Urinary Tract: Bilateral adrenal glands are unremarkable. No hydronephrosis. No solid enhancing renal mass. Stomach/Bowel: Visualized portions within the abdomen are unremarkable. Vascular/Lymphatic: No pathologically enlarged lymph nodes identified. No abdominal aortic aneurysm demonstrated. Other:  No abdominal ascites. Musculoskeletal: No suspicious bone lesions identified. IMPRESSION: 1. No evidence of pancreatic mass or other suspicious abnormality. 2. Cholelithiasis without findings of acute cholecystitis. 3. Stable mild splenomegaly. Electronically Signed   By: Dahlia Bailiff M.D.   On: 11/20/2020 20:48     ASSESSMENT: 50 y.o.  woman status post right breast lower inner quadrant biopsy 12/05/2014 for ductal carcinoma in situ arising from an intraductal papilloma, estrogen and progesterone receptor positive  (1) genetics testing at [facility] obtained December 2016 showed a deleterious mutation in CDKN2A (specifically c.9_[id number] [Ala3 Pro11dup]).  The patient does not carry theCHEK2  Deletion present in her mother.  (a)  Patients who carry a  CDKN2A mutation are at high risk of melanoma  (28-76% lifetime risk) and pancreatic cancer (17% lifetime risk).  (2)  Status post bilateral mastectomies with immediate DIEP reconstruction 02/24//2017 showing  (a)  On the left side, benign breast tissue  (b)  On the right side, pTis pN0, stage 0 DCIS  measuring, 2.8 cm, estrogen receptor and progesterone receptor positive,   (3)  Screening for melanoma with biannual skin exam is recommended ([place])   (4) screening for pancreatic cancer will consist of by annual MRI/MRCP  (a) abdominal MRI 04/12/2017shows only cholelithiasis and mild hepatomegaly  (b) abdominal MRI/MRCP 10/17/2015 showed  normal pancreas, mild hepatomegaly, and cholelithiasis  (c) repeat abdominal MRCP is January 2019 and December 2019 show no pancreatic lesion  (5) Mirena IUD in place   PLAN:  Wrenn is coming up on 6 years out from definitive surgery for her breast cancer with no evidence of disease recurrence or activity.  This is very favorable.  She just had an MRCP which shows no concern regarding pancreatic changes and also incidentally no hepatic steatosis or other liver lesions.  The rest of her screening, specifically for melanoma, is up-to-date.  She will see [place] again in 1 year, with repeat abdominal MRI shortly before that visit.    Total encounter time 25 minutes.*   Wen Merced, Virgie Dad, MD  11/27/20 6:48 PM Medical Oncology and Hematology [facility] [place], [place] [address] Tel. [phone]    Fax. [phone]   I, Wilburn Mylar, am acting as scribe for Dr. Virgie Dad. Sargun Rummell.  I, Lurline Del MD, have reviewed the above documentation for accuracy and completeness, and I agree with the above.   *Total Encounter Time as defined by the Centers for Medicare and Medicaid Services includes, in addition to the face-to-face time of a patient visit (documented in the note above) non-face-to-face time: obtaining and reviewing outside history, ordering and reviewing medications, tests or procedures, care coordination (communications with other health care professionals or caregivers) and documentation in the medical record.

## 2020-12-03 ENCOUNTER — Other Ambulatory Visit: Payer: Self-pay | Admitting: Cardiovascular Disease

## 2020-12-04 DIAGNOSIS — L821 Other seborrheic keratosis: Secondary | ICD-10-CM | POA: Diagnosis not present

## 2020-12-04 DIAGNOSIS — L814 Other melanin hyperpigmentation: Secondary | ICD-10-CM | POA: Diagnosis not present

## 2020-12-04 DIAGNOSIS — D2261 Melanocytic nevi of right upper limb, including shoulder: Secondary | ICD-10-CM | POA: Diagnosis not present

## 2020-12-04 DIAGNOSIS — D225 Melanocytic nevi of trunk: Secondary | ICD-10-CM | POA: Diagnosis not present

## 2020-12-11 ENCOUNTER — Ambulatory Visit: Payer: BC Managed Care – PPO | Admitting: Cardiovascular Disease

## 2020-12-11 ENCOUNTER — Encounter: Payer: Self-pay | Admitting: Cardiovascular Disease

## 2020-12-11 ENCOUNTER — Other Ambulatory Visit: Payer: Self-pay

## 2020-12-11 VITALS — BP 112/86 | HR 61 | Ht 68.0 in | Wt 196.2 lb

## 2020-12-11 DIAGNOSIS — E782 Mixed hyperlipidemia: Secondary | ICD-10-CM

## 2020-12-11 DIAGNOSIS — I2510 Atherosclerotic heart disease of native coronary artery without angina pectoris: Secondary | ICD-10-CM | POA: Diagnosis not present

## 2020-12-11 DIAGNOSIS — I10 Essential (primary) hypertension: Secondary | ICD-10-CM

## 2020-12-11 MED ORDER — LOSARTAN POTASSIUM 25 MG PO TABS: 25.0000 mg | ORAL_TABLET | Freq: Every day | ORAL | 3 refills | Status: DC

## 2020-12-11 NOTE — Progress Notes (Signed)
Cardiology Office Note:    Date:  12/11/2020   ID:  [name], DOB 06/30/1970, MRN [id number]  PCP:  Shelda Pal, DO   [facility] Providers Cardiologist:  Sherren Mocha, MD     Referring MD: Shelda Pal*   Chief Complaint  Patient presents with   Coronary Artery Disease    History of Present Illness:    [name] is a 50 y.o. female with a hx of coronary artery dissection, presenting today for follow-up evaluation.  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 She underwent single-vessel CABG. Follow-up gated cardiac CTA demonstrated healing of the native RCA and continued patency of the saphenous vein graft RCA. LV function at follow-up has been normal with an ejection fraction of 55-60%.  With her known history of spontaneous coronary artery dissection, she notified me that a family member had an aortic aneurysm.  Elected to order a thoracic MRA to evaluate for aneurysmal disease.  The patient is here alone today.  She is doing well at present.  She denies chest pain, chest pressure, leg swelling, or heart palpitations.  She has chronic exertional dyspnea that she experiences with walking a flight of stairs.  However, she is able to do exercise training with walking and jogging without much limitation.  None of the symptoms have changed over the past year.  She had a stress echocardiogram in May of this year that showed good exercise tolerance, no ischemic changes, and good augmentation of LV function with LVEF increasing from 60% to 80% post exercise.  Past Medical History:  Diagnosis Date   Allergy    Arthritis    Blood transfusion without reported diagnosis    breast cancer 2016   Breast cancer ([place]) 12/2014   Seeing [facility] doctors   CAD (coronary artery disease)    open heart surgery 09/12/10   Cancer ([place]) 2007    thyroid   Family hx of colon cancer    GERD (gastroesophageal reflux disease)    Gilbert disease    Heart disease    History of thyroid cancer    Hypothyroid    Overweight(278.02)    PONV (postoperative nausea and vomiting)     Past Surgical History:  Procedure Laterality Date   arm surgery     rt   BILATERAL TOTAL MASTECTOMY WITH AXILLARY LYMPH NODE DISSECTION     coronary artery bypass grafting x1  09/13/10   [place] [facility]   HERNIA REPAIR  1977   inguinal, ?side   KNEE ARTHROSCOPY Right 09/13/2015   pt reported   THYROIDECTOMY     ULNAR NERVE REPAIR Right 1989, 2002    Current Medications: Current Meds  Medication Sig   levonorgestrel (MIRENA) 20 MCG/24HR IUD 1 each by Intrauterine route once.   levothyroxine (SYNTHROID) 150 MCG tablet TAKE 1 TABLET(150 MCG) BY MOUTH EVERY MORNING   losartan (COZAAR) 25 MG tablet Take 1 tablet (25 mg total) by mouth daily.   metoprolol succinate (TOPROL-XL) 25 MG 24 hr tablet Take 1 tablet (25 mg total) by mouth daily. Please keep upcoming appt in November 2022 with Dr. Burt Knack before anymore refills. Thank you   omeprazole (PRILOSEC) 10 MG capsule Take 1 capsule (10 mg total) by mouth 2 (two) times daily.     Allergies:   Patient has no known allergies.   Social History   Socioeconomic History  Marital status: Married    Spouse name: Not on file   Number of children: 2   Years of education: 16   Highest education level: Not on file  Occupational History   Occupation: IT    Employer: [employer]  Tobacco Use   Smoking status: Never   Smokeless tobacco: Never  Vaping Use   Vaping Use: Never used  Substance and Sexual Activity   Alcohol use: Yes    Comment: rarely   Drug use: No   Sexual activity: Yes    Birth control/protection: I.U.D.  Other Topics Concern   Not on file  Social History Narrative   Regular exercise-yes   Caffeine Use-yes   Works as Patent examiner   [phone]- son   Married    Enjoys Marine scientist and a Scientist, research (physical sciences) degree   Social Determinants of Radio broadcast assistant Strain: Not on Art therapist Insecurity: Not on file  Transportation Needs: Not on file  Physical Activity: Not on file  Stress: Not on file  Social Connections: Not on file     Family History: The patient's family history includes Arthritis in her mother and paternal grandmother; Breast cancer in her maternal aunt and mother; Cancer in her maternal aunt and mother; Colon cancer in her maternal uncle; Colon polyps in her mother; Coronary artery disease in her maternal grandfather; Diabetes in her maternal aunt, maternal aunt, and paternal grandmother; Heart disease in her maternal aunt; Hiatal hernia in her father; Hypertension in her father, mother, and sister; Lupus in her maternal aunt; Other in her maternal aunt; Parkinson's disease in her paternal grandmother. There is no history of Esophageal cancer, Rectal cancer, or Stomach cancer.  ROS:   Please see the history of present illness.    All other systems reviewed and are negative.  EKGs/Labs/Other Studies Reviewed:    The following studies were reviewed today: Exercise stress echocardiogram 06/05/2020: EKG: Resting EKG showed normal sinus rhythm. The patient developed no  abnormal EKG findings and Motion artifact during exercise.      2D Echo Findings: The baseline ejection fraction was 60%. The peak  ejection fraction at stress was 80%. Baseline regional wall motion  abnormalities were not present. There were no stress-induced wall motion  abnormalities. This is a negative stress  echocardiogram for ischemia.   Chest MRA: IMPRESSION: Vascular:   Normal caliber and patent thoracic aorta without evidence of dissection.   Non-Vascular:   No acute thoracic abnormality.   EKG:  EKG is ordered today.  The ekg ordered today demonstrates NSR 61 bpm, within normal limits  Recent Labs: 08/22/2020: TSH  0.16 11/27/2020: ALT 28; BUN 17; Creatinine, Ser 0.98; Hemoglobin 12.1; Platelets 270; Potassium 4.1; Sodium 140  Recent Lipid Panel    Component Value Date/Time   CHOL 147 08/22/2020 0730   TRIG 68.0 08/22/2020 0730   HDL 47.20 08/22/2020 0730   CHOLHDL 3 08/22/2020 0730   VLDL 13.6 08/22/2020 0730   LDLCALC 86 08/22/2020 0730     Risk Assessment/Calculations:           Physical Exam:    VS:  BP 112/86   Pulse 61   Ht 5\' 8"  (1.727 m)   Wt 196 lb 3.2 oz (89 kg)   SpO2 97%   BMI 29.83 kg/m     Wt Readings from Last 3 Encounters:  12/11/20 196 lb 3.2 oz (89 kg)  11/27/20  198 lb 4.8 oz (89.9 kg)  10/12/20 201 lb (91.2 kg)     GEN:  Well nourished, well developed in no acute distress HEENT: Normal NECK: No JVD; No carotid bruits LYMPHATICS: No lymphadenopathy CARDIAC: RRR, no murmurs, rubs, gallops RESPIRATORY:  Clear to auscultation without rales, wheezing or rhonchi  ABDOMEN: Soft, non-tender, non-distended MUSCULOSKELETAL:  No edema; No deformity  SKIN: Warm and dry NEUROLOGIC:  Alert and oriented x 3 PSYCHIATRIC:  Normal affect   ASSESSMENT:    1. Coronary artery disease involving native coronary artery of native heart without angina pectoris   2. Essential hypertension   3. Mixed hyperlipidemia    PLAN:    In order of problems listed above:  Stable without any symptoms of angina after remote spontaneous coronary artery dissection treated with coronary bypass surgery. I reviewed her blood pressures over time.  Approximately 50% of the time she has a diastolic blood pressure greater than 85 mmHg.  Discussed treatment options.  Recommended addition of losartan 25 mg daily.  We will check a metabolic panel in 3 to 4 weeks.  She will log her blood pressures and send in some readings after a few weeks. Lipids reviewed.  LDL cholesterol was 86 mg/dL.  Continue lifestyle modifications and nonpharmacologic treatment.  No indication for statin drug.   Medication  Adjustments/Labs and Tests Ordered: Current medicines are reviewed at length with the patient today.  Concerns regarding medicines are outlined above.  Orders Placed This Encounter  Procedures   Basic metabolic panel   EKG [id number]    Meds ordered this encounter  Medications   losartan (COZAAR) 25 MG tablet    Sig: Take 1 tablet (25 mg total) by mouth daily.    Dispense:  90 tablet    Refill:  3     Patient Instructions  Medication Instructions:  Your physician has recommended you make the following change in your medication:  1.) start losartan (Cozaar) 25 mg - take one tablet daily  *If you need a refill on your cardiac medications before your next appointment, please call your pharmacy*   Lab Work: Please return around 01/03/21 for blood work Artist)  If you have labs (blood work) drawn today and your tests are completely normal, you will receive your results only by: [employer] (if you have MyChart) OR A paper copy in the mail If you have any lab test that is abnormal or we need to change your treatment, we will call you to review the results.   Testing/Procedures: none   Follow-Up: At [facility], you and your health needs are our priority.  As part of our continuing mission to provide you with exceptional heart care, we have created designated Provider Care Teams.  These Care Teams include your primary Cardiologist (physician) and Advanced Practice Providers (APPs -  Physician Assistants and Nurse Practitioners) who all work together to provide you with the care you need, when you need it.    Your next appointment:   12 month(s)  The format for your next appointment:   In Person  Provider:   Juanda Bond. Burt Knack, MD    Other Instructions     Signed, Sherren Mocha, MD  12/11/2020 1:50 PM    [facility]

## 2020-12-11 NOTE — Patient Instructions (Signed)
Medication Instructions:  Your physician has recommended you make the following change in your medication:  1.) start losartan (Cozaar) 25 mg - take one tablet daily  *If you need a refill on your cardiac medications before your next appointment, please call your pharmacy*   Lab Work: Please return around 01/03/21 for blood work Artist)  If you have labs (blood work) drawn today and your tests are completely normal, you will receive your results only by: [employer] (if you have MyChart) OR A paper copy in the mail If you have any lab test that is abnormal or we need to change your treatment, we will call you to review the results.   Testing/Procedures: none   Follow-Up: At [facility], you and your health needs are our priority.  As part of our continuing mission to provide you with exceptional heart care, we have created designated Provider Care Teams.  These Care Teams include your primary Cardiologist (physician) and Advanced Practice Providers (APPs -  Physician Assistants and Nurse Practitioners) who all work together to provide you with the care you need, when you need it.    Your next appointment:   12 month(s)  The format for your next appointment:   In Person  Provider:   Juanda Bond. Burt Knack, MD    Other Instructions

## 2021-01-09 ENCOUNTER — Other Ambulatory Visit: Payer: Self-pay

## 2021-01-09 ENCOUNTER — Other Ambulatory Visit: Payer: BC Managed Care – PPO | Admitting: *Deleted

## 2021-01-09 DIAGNOSIS — I2510 Atherosclerotic heart disease of native coronary artery without angina pectoris: Secondary | ICD-10-CM

## 2021-01-09 DIAGNOSIS — E782 Mixed hyperlipidemia: Secondary | ICD-10-CM | POA: Diagnosis not present

## 2021-01-09 DIAGNOSIS — I10 Essential (primary) hypertension: Secondary | ICD-10-CM | POA: Diagnosis not present

## 2021-01-09 LAB — BASIC METABOLIC PANEL
BUN/Creatinine Ratio: 14 (ref 9–23)
BUN: 14 mg/dL (ref 6–24)
CO2: 22 mmol/L (ref 20–29)
Calcium: 10.2 mg/dL (ref 8.7–10.2)
Chloride: 106 mmol/L (ref 96–106)
Creatinine, Ser: 0.97 mg/dL (ref 0.57–1.00)
Glucose: 90 mg/dL (ref 70–99)
Potassium: 4.1 mmol/L (ref 3.5–5.2)
Sodium: 141 mmol/L (ref 134–144)
eGFR: 70 mL/min/{1.73_m2} (ref >59)

## 2021-02-07 ENCOUNTER — Encounter: Payer: Self-pay | Admitting: Family Medicine

## 2021-02-07 ENCOUNTER — Ambulatory Visit: Payer: BC Managed Care – PPO | Admitting: Family Medicine

## 2021-02-07 VITALS — BP 124/78 | HR 86 | Temp 98.4°F | Ht 68.0 in | Wt 199.8 lb

## 2021-02-07 DIAGNOSIS — R058 Other specified cough: Secondary | ICD-10-CM

## 2021-02-07 MED ORDER — BENZONATATE 200 MG PO CAPS
200.0000 mg | ORAL_CAPSULE | Freq: Two times a day (BID) | ORAL | 2 refills | Status: DC | PRN
Start: 2021-02-07 — End: 2021-02-22

## 2021-02-07 MED ORDER — PREDNISONE 20 MG PO TABS: 40.0000 mg | ORAL_TABLET | Freq: Every day | ORAL | 0 refills | Status: AC

## 2021-02-07 MED ORDER — CHLORPHENIRAMINE MALEATE 4 MG PO TABS: 4.0000 mg | ORAL_TABLET | Freq: Two times a day (BID) | ORAL | 0 refills | Status: DC | PRN

## 2021-02-07 NOTE — Progress Notes (Signed)
Acute Office Visit  Subjective:    Patient ID: [name], female    DOB: [birth year], 51 y.o.   MRN: [id number]  Chief Complaint  Patient presents with   Cough    Going on a month or month and a half.  Mostly dry only some flam at times.     Cough  Patient is in today for cough.  Patient reports she had a virus about 6 weeks ago. All symptoms resolved, however cough has lingered. She reports cough is non-productive, intermittent throughout the day. She denies any fevers, chest pain, wheezing, dyspnea, fatigue, sinus pressure, headaches, unusual weight loss, sputum production. She has tried occasional Flonase, but nothing else.      Past Medical History:  Diagnosis Date   Allergy    Arthritis    Blood transfusion without reported diagnosis    breast cancer 2016   Breast cancer ([place]) 12/2014   Seeing [facility] doctors   CAD (coronary artery disease)    open heart surgery 09/12/10   Cancer ([place]) 2007   thyroid   Family hx of colon cancer    GERD (gastroesophageal reflux disease)    Gilbert disease    Heart disease    History of thyroid cancer    Hypothyroid    Overweight(278.02)    PONV (postoperative nausea and vomiting)     Past Surgical History:  Procedure Laterality Date   arm surgery     rt   BILATERAL TOTAL MASTECTOMY WITH AXILLARY LYMPH NODE DISSECTION     coronary artery bypass grafting x1  09/13/10   [place] [facility]   HERNIA REPAIR  1977   inguinal, ?side   KNEE ARTHROSCOPY Right 09/13/2015   pt reported   [place] Right 1989, 2002    Family History  Problem Relation Age of Onset   Hypertension Mother    Colon polyps Mother    Arthritis Mother    Cancer Mother        breast   Breast cancer Mother    Hypertension Father    Hiatal hernia Father    Hypertension Sister    Lupus Maternal Aunt    Heart disease Maternal Aunt    Diabetes Maternal Aunt    Arthritis Paternal Grandmother    Diabetes Paternal Grandmother     Parkinson's disease Paternal Grandmother    Coronary artery disease Maternal Grandfather    Cancer Maternal Aunt        breast, ovarian   Breast cancer Maternal Aunt    Diabetes Maternal Aunt    Other Maternal Aunt        ?auto immune disease   Colon cancer Maternal Uncle    Esophageal cancer Neg Hx    Rectal cancer Neg Hx    Stomach cancer Neg Hx     Social History   Socioeconomic History   Marital status: Married    Spouse name: Not on file   Number of children: 2   Years of education: 16   Highest education level: Not on file  Occupational History   Occupation: Building control surveyor: [employer]  Tobacco Use   Smoking status: Never   Smokeless tobacco: Never  Vaping Use   Vaping Use: Never used  Substance and Sexual Activity   Alcohol use: Yes    Comment: rarely   Drug use: No   Sexual activity: Yes    Birth control/protection: I.U.D.  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     levothyroxine (SYNTHROID) 150 MCG tablet TAKE 1 TABLET(150 MCG) BY MOUTH EVERY MORNING 90 tablet 3   losartan (COZAAR) 25 MG tablet Take 1 tablet (25 mg total) by mouth daily. 90 tablet 3   metoprolol succinate (TOPROL-XL) 25 MG 24 hr tablet Take 1 tablet (25 mg total) by mouth daily. Please keep upcoming appt in November 2022 with Dr. Burt Knack before anymore refills.  Thank you 90 tablet 0   omeprazole (PRILOSEC) 10 MG capsule Take 1 capsule (10 mg total) by mouth 2 (two) times daily. 180 capsule 2   No facility-administered medications prior to visit.    No Known Allergies  Review of Systems  Respiratory:  Positive for cough.   All review of systems negative except what is listed in the HPI     Objective:    Physical Exam Vitals reviewed.  Constitutional:      Appearance: Normal appearance.  HENT:     Head: Normocephalic and atraumatic.     Right Ear: Tympanic membrane normal.     Left Ear: Tympanic membrane normal.  Cardiovascular:     Rate and Rhythm: Normal rate and regular rhythm.  Pulmonary:     Effort: Pulmonary effort is normal.     Breath sounds: Normal breath sounds. No wheezing, rhonchi or rales.  Skin:    General: Skin is warm and dry.  Neurological:     General: No focal deficit present.     Mental Status: She is alert and oriented to person, place, and time. Mental status is at baseline.  Psychiatric:        Mood and Affect: Mood normal.        Behavior: Behavior normal.        Thought Content: Thought content normal.        Judgment: Judgment normal.    BP 124/78    Pulse 86    Temp 98.4 F (36.9 C) (Oral)    Ht _0  (1.727 m)    Wt 199 lb 12.8 oz (90.6 kg)    SpO2 96%    BMI 30.38 kg/m  Wt Readings from Last 3 Encounters:  02/07/21 199 lb 12.8 oz (90.6 kg)  12/11/20 196 lb 3.2 oz (89 kg)  11/27/20 198 lb 4.8 oz (89.9 kg)    Health Maintenance Due  Topic Date Due   COVID-19 Vaccine (4 - Booster for Pfizer series) 02/10/2020    There are no preventive care reminders to display for this patient.   Lab Results  Component Value Date   TSH 0.16 (L) 08/22/2020   Lab Results  Component Value Date   WBC 7.8 11/27/2020   HGB 12.1 11/27/2020   HCT 37.0 11/27/2020   MCV 80.1 11/27/2020   PLT  270 11/27/2020   Lab Results  Component Value Date   NA 141 01/09/2021   K 4.1 01/09/2021   CHLORIDE 109  10/21/2016   CO2 22 01/09/2021   GLUCOSE 90 01/09/2021   BUN 14 01/09/2021   CREATININE 0.97 01/09/2021   BILITOT 0.8 11/27/2020   ALKPHOS 119 11/27/2020   AST 31 11/27/2020   ALT 28 11/27/2020   PROT 7.2 11/27/2020   ALBUMIN 4.0 11/27/2020   CALCIUM 10.2 01/09/2021   ANIONGAP 8 11/27/2020   EGFR 70 01/09/2021   GFR 62.89 08/22/2020   Lab Results  Component Value Date   CHOL 147 08/22/2020   Lab Results  Component Value Date   HDL 47.20 08/22/2020   Lab Results  Component Value Date   LDLCALC 86 08/22/2020   Lab Results  Component Value Date   TRIG 68.0 08/22/2020   Lab Results  Component Value Date   CHOLHDL 3 08/22/2020   Lab Results  Component Value Date   HGBA1C 5.4 08/22/2019       Assessment & Plan:   1. Post-viral cough syndrome Chlorpheniramine at bedtime Benzonatate twice a day Prednisone for the first 5 days (do not take with antiinflammatories - ibuprofen, Aleve, etc) Delsym as needed Voice rest Warm soothing liquids, honey Consider adding reflux medicine if not improving with the above If no improvement in 2-3 weeks or if worse before then, let us know and we will get a chest xray (offered CXR today, but she declined).    - predniSONE (DELTASONE) 20 MG tablet; Take 2 tablets (40 mg total) by mouth daily with breakfast for 5 days.  Dispense: 10 tablet; Refill: 0 - benzonatate (TESSALON) 200 MG capsule; Take 1 capsule (200 mg total) by mouth 2 (two) times daily as needed for cough.  Dispense: 20 capsule; Refill: 2 - chlorpheniramine (CHLOR-TRIMETON) 4 MG tablet; Take 1 tablet (4 mg total) by mouth 2 (two) times daily as needed for allergies.  Dispense: 14 tablet; Refill: 0   Follow-up if not improving and as needed.   Terrilyn Saver, NP

## 2021-02-07 NOTE — Patient Instructions (Signed)
For your cough: Chlorpheniramine at bedtime Benzonatate twice a day Prednisone for the first 5 days (do not take with antiinflammatories - ibuprofen, Aleve, etc) Delsym as needed Voice rest Warm soothing liquids, honey Consider adding reflux medicine if not improving with the above If no improvement in 2-3 weeks or if worse before then, let us know and we will get a chest xray

## 2021-02-22 ENCOUNTER — Ambulatory Visit: Payer: BC Managed Care – PPO | Admitting: Family Medicine

## 2021-02-22 ENCOUNTER — Encounter: Payer: Self-pay | Admitting: Family Medicine

## 2021-02-22 VITALS — BP 120/80 | HR 72 | Temp 98.0°F | Ht 68.0 in | Wt 199.5 lb

## 2021-02-22 DIAGNOSIS — E890 Postprocedural hypothyroidism: Secondary | ICD-10-CM | POA: Diagnosis not present

## 2021-02-22 DIAGNOSIS — K219 Gastro-esophageal reflux disease without esophagitis: Secondary | ICD-10-CM | POA: Diagnosis not present

## 2021-02-22 LAB — TSH: TSH: 0.17 u[IU]/mL — ABNORMAL LOW (ref 0.35–5.50)

## 2021-02-22 LAB — T4, FREE: Free T4: 1.11 ng/dL (ref 0.60–1.60)

## 2021-02-22 NOTE — Progress Notes (Signed)
Chief Complaint  Patient presents with   Follow-up    Subjective: Patient is a 51 y.o. female here for f/u.  Hypothyroidism Patient presents for follow-up of hypothyroidism.  Reports compliance with medication- levothyroxine 150 mcg/d. Current symptoms include: denies fatigue, weight changes, heat/cold intolerance, bowel/skin changes or CVS symptoms She believes her dose should be unchanged  GERD Hx of Gerd. Takes omeprazole 10 mg bid prn. O AE's. S/s's are controlled.   Past Medical History:  Diagnosis Date   Allergy    Arthritis    Blood transfusion without reported diagnosis    breast cancer 2016   Breast cancer ([place]) 12/2014   Seeing [facility] doctors   CAD (coronary artery disease)    open heart surgery 09/12/10   Cancer ([place]) 2007   thyroid   Family hx of colon cancer    GERD (gastroesophageal reflux disease)    Gilbert disease    Heart disease    History of thyroid cancer    Hypothyroid    Overweight(278.02)    PONV (postoperative nausea and vomiting)     Objective: BP 120/80    Pulse 72    Temp 98 F (36.7 C) (Oral)    Ht 5\' 8"  (1.727 m)    Wt 199 lb 8 oz (90.5 kg)    SpO2 97%    BMI 30.33 kg/m  General: Awake, appears stated age Heart: RRR, no LE edema Lungs: CTAB, no rales, wheezes or rhonchi. No accessory muscle use Abd: BS+, S, NT, ND Neck: No masses or asymmetry Psych: Age appropriate judgment and insight, normal affect and mood  Assessment and Plan: Postsurgical hypothyroidism - Plan: T4, free, TSH  Gastroesophageal reflux disease, unspecified whether esophagitis present  Chronic, stable. Cont levothyroxine 150 mcg/d. Ck labs today. Chronic, stable. Cont omeprazole 20 mg/d vs 10 mg bid pending on how she is doing. Discussed reflux precautions.  F/u in 6 mo for CPE or prn.  The patient voiced understanding and agreement to the plan.  [place], DO 02/22/21  7:06 AM

## 2021-02-22 NOTE — Patient Instructions (Addendum)
Good luck with your upcoming runs.   Give [place] 2-3 business days to get the results of your labs back.   If things with your foot don't improve in the next 2-3 weeks, please call Dr. Raeford Razor. Let me know if you need a referral.   Let us know if you need anything.

## 2021-03-01 ENCOUNTER — Other Ambulatory Visit: Payer: Self-pay | Admitting: Cardiovascular Disease

## 2021-04-04 ENCOUNTER — Encounter: Payer: Self-pay | Admitting: Family Medicine

## 2021-04-10 ENCOUNTER — Other Ambulatory Visit: Payer: Self-pay | Admitting: Family Medicine

## 2021-04-10 DIAGNOSIS — K219 Gastro-esophageal reflux disease without esophagitis: Secondary | ICD-10-CM

## 2021-05-20 DIAGNOSIS — Z713 Dietary counseling and surveillance: Secondary | ICD-10-CM | POA: Diagnosis not present

## 2021-06-05 DIAGNOSIS — L814 Other melanin hyperpigmentation: Secondary | ICD-10-CM | POA: Diagnosis not present

## 2021-06-05 DIAGNOSIS — D2261 Melanocytic nevi of right upper limb, including shoulder: Secondary | ICD-10-CM | POA: Diagnosis not present

## 2021-06-05 DIAGNOSIS — L821 Other seborrheic keratosis: Secondary | ICD-10-CM | POA: Diagnosis not present

## 2021-06-05 DIAGNOSIS — D225 Melanocytic nevi of trunk: Secondary | ICD-10-CM | POA: Diagnosis not present

## 2021-06-05 DIAGNOSIS — Z713 Dietary counseling and surveillance: Secondary | ICD-10-CM | POA: Diagnosis not present

## 2021-06-25 DIAGNOSIS — Z713 Dietary counseling and surveillance: Secondary | ICD-10-CM | POA: Diagnosis not present

## 2021-08-08 DIAGNOSIS — Z713 Dietary counseling and surveillance: Secondary | ICD-10-CM | POA: Diagnosis not present

## 2021-08-27 ENCOUNTER — Ambulatory Visit (INDEPENDENT_AMBULATORY_CARE_PROVIDER_SITE_OTHER): Payer: BC Managed Care – PPO | Admitting: Family Medicine

## 2021-08-27 ENCOUNTER — Encounter: Payer: Self-pay | Admitting: Family Medicine

## 2021-08-27 ENCOUNTER — Other Ambulatory Visit: Payer: Self-pay | Admitting: Family Medicine

## 2021-08-27 VITALS — BP 110/68 | HR 74 | Temp 97.6°F | Ht 68.0 in | Wt 200.4 lb

## 2021-08-27 DIAGNOSIS — D72818 Other decreased white blood cell count: Secondary | ICD-10-CM

## 2021-08-27 DIAGNOSIS — E890 Postprocedural hypothyroidism: Secondary | ICD-10-CM

## 2021-08-27 DIAGNOSIS — R5383 Other fatigue: Secondary | ICD-10-CM | POA: Diagnosis not present

## 2021-08-27 DIAGNOSIS — E039 Hypothyroidism, unspecified: Secondary | ICD-10-CM

## 2021-08-27 DIAGNOSIS — Z0000 Encounter for general adult medical examination without abnormal findings: Secondary | ICD-10-CM | POA: Diagnosis not present

## 2021-08-27 DIAGNOSIS — R0683 Snoring: Secondary | ICD-10-CM

## 2021-08-27 DIAGNOSIS — K219 Gastro-esophageal reflux disease without esophagitis: Secondary | ICD-10-CM

## 2021-08-27 DIAGNOSIS — R748 Abnormal levels of other serum enzymes: Secondary | ICD-10-CM

## 2021-08-27 LAB — COMPREHENSIVE METABOLIC PANEL
ALT: 41 U/L — ABNORMAL HIGH (ref 0–35)
AST: 40 U/L — ABNORMAL HIGH (ref 0–37)
Albumin: 4.2 g/dL (ref 3.5–5.2)
Alkaline Phosphatase: 154 U/L — ABNORMAL HIGH (ref 39–117)
BUN: 18 mg/dL (ref 6–23)
CO2: 23 mEq/L (ref 19–32)
Calcium: 9.9 mg/dL (ref 8.4–10.5)
Chloride: 107 mEq/L (ref 96–112)
Creatinine, Ser: 1.07 mg/dL (ref 0.40–1.20)
GFR: 59.65 mL/min — ABNORMAL LOW (ref >60.00)
Glucose, Bld: 91 mg/dL (ref 70–99)
Potassium: 4.1 mEq/L (ref 3.5–5.1)
Sodium: 139 mEq/L (ref 135–145)
Total Bilirubin: 1 mg/dL (ref 0.2–1.2)
Total Protein: 6.7 g/dL (ref 6.0–8.3)

## 2021-08-27 LAB — CBC
HCT: 33.6 % — ABNORMAL LOW (ref 36.0–46.0)
Hemoglobin: 10.8 g/dL — ABNORMAL LOW (ref 12.0–15.0)
MCHC: 32.1 g/dL (ref 30.0–36.0)
MCV: 75.5 fl — ABNORMAL LOW (ref 78.0–100.0)
Platelets: 230 10*3/uL (ref 150.0–400.0)
RBC: 4.45 Mil/uL (ref 3.87–5.11)
RDW: 15 % (ref 11.5–15.5)
WBC: 5.5 10*3/uL (ref 4.0–10.5)

## 2021-08-27 LAB — LIPID PANEL
Cholesterol: 150 mg/dL (ref 0–200)
HDL: 48.4 mg/dL (ref >39.00)
LDL Cholesterol: 78 mg/dL (ref 0–99)
NonHDL: 101.97
Total CHOL/HDL Ratio: 3
Triglycerides: 119 mg/dL (ref 0.0–149.0)
VLDL: 23.8 mg/dL (ref 0.0–40.0)

## 2021-08-27 LAB — TSH: TSH: 0.19 u[IU]/mL — ABNORMAL LOW (ref 0.35–5.50)

## 2021-08-27 LAB — T4, FREE: Free T4: 1.26 ng/dL (ref 0.60–1.60)

## 2021-08-27 MED ORDER — OMEPRAZOLE 40 MG PO CPDR: 40.0000 mg | DELAYED_RELEASE_CAPSULE | Freq: Every day | ORAL | 3 refills | Status: DC

## 2021-08-27 NOTE — Patient Instructions (Addendum)
Give [place] 2-3 business days to get the results of your labs back.   Keep the diet clean and stay active.  Please get me a copy of your advanced directive form at your convenience.   I recommend getting the flu shot in mid October. This suggestion would change if the CDC comes out with a different recommendation.   Consider black cohosh or soy products to help with hot flashes. If that doesn't help, send me a message in 1 month and we can consider drugs (Effexor/venlafaxine).   If you do not hear anything about your referral in the next 1-2 weeks, call our office and ask for an update.  Let us know if you need anything.

## 2021-08-27 NOTE — Progress Notes (Signed)
Chief Complaint  Patient presents with   Annual Exam     Well Woman [name] is here for a complete physical.   Her last physical was >1 year ago.  Current diet: in general, a "healthy" diet. Current exercise: running, wt resistance, HIIT. Weight is stable and she denies fatigue out of ordinary. Seatbelt? Yes Advanced directive? No  Health Maintenance Pap/HPV- Yes Mammogram- Yes Colon cancer screening-Yes Shingrix- Yes Tetanus- Yes Hep C screening- Yes HIV screening- Yes  Past Medical History:  Diagnosis Date   Allergy    Arthritis    Blood transfusion without reported diagnosis    breast cancer 2016   Breast cancer ([place]) 12/2014   Seeing [facility] doctors   CAD (coronary artery disease)    open heart surgery 09/12/10   Cancer ([place]) 2007   thyroid   Family hx of colon cancer    GERD (gastroesophageal reflux disease)    Gilbert disease    Heart disease    History of thyroid cancer    Hypothyroid    Overweight(278.02)    PONV (postoperative nausea and vomiting)      Past Surgical History:  Procedure Laterality Date   arm surgery     rt   BILATERAL TOTAL MASTECTOMY WITH AXILLARY LYMPH NODE DISSECTION     coronary artery bypass grafting x1  09/13/10   [place] [facility]   HERNIA REPAIR  1977   inguinal, ?side   KNEE ARTHROSCOPY Right 09/13/2015   pt reported   [place], 2002    Medications  Current Outpatient Medications on File Prior to Visit  Medication Sig Dispense Refill   chlorpheniramine (CHLOR-TRIMETON) 4 MG tablet Take 1 tablet (4 mg total) by mouth 2 (two) times daily as needed for allergies. 14 tablet 0   levonorgestrel (MIRENA) 20 MCG/24HR IUD 1 each by Intrauterine route once.     levothyroxine (SYNTHROID) 150 MCG tablet TAKE 1 TABLET(150 MCG) BY MOUTH EVERY MORNING 90 tablet 3   losartan (COZAAR) 25 MG tablet Take 1 tablet (25 mg total) by mouth daily. 90 tablet 3   metoprolol succinate (TOPROL-XL) 25 MG 24  hr tablet TAKE 1 TABLET BY MOUTH. PLEASE KEEP UPCOMING APPT IN NOVEMVER 2022 WITH DOCTOR COOPER BEFORE ANYMORE REFILLS. 90 tablet 2   omeprazole (PRILOSEC) 10 MG capsule TAKE 1 CAPSULE TWICE A DAY 180 capsule 3    Allergies No Known Allergies  Review of Systems: Constitutional:  no unexpected weight changes Eye:  no recent significant change in vision Ear/Nose/Mouth/Throat:  Ears:  no recent change in hearing Nose/Mouth/Throat:  no complaints of nasal congestion, no sore throat Cardiovascular: no chest pain Respiratory:  no shortness of breath Gastrointestinal:  no abdominal pain, no change in bowel habits GU:  Female: negative for dysuria or pelvic pain Musculoskeletal/Extremities:  no pain of the joints Integumentary (Skin/Breast):  no abnormal skin lesions reported Neurologic:  no headaches Endocrine:  denies fatigue  Exam BP 110/68   Pulse 74   Temp 97.6 F (36.4 C) (Oral)   Ht '5\' 8"'$  (1.727 m)   Wt 200 lb 6 oz (90.9 kg)   SpO2 96%   BMI 30.47 kg/m  General:  well developed, well nourished, in no apparent distress Skin:  no significant moles, warts, or growths Head:  no masses, lesions, or tenderness Eyes:  pupils equal and round, sclera anicteric without injection Ears:  canals without lesions, TMs shiny without retraction, no obvious effusion, no  erythema Nose:  nares patent, septum midline, mucosa normal, and no drainage or sinus tenderness Throat/Pharynx:  lips and gingiva without lesion; tongue and uvula midline; non-inflamed pharynx; no exudates or postnasal drainage Neck: neck supple without adenopathy, thyromegaly, or masses Lungs:  clear to auscultation, breath sounds equal bilaterally, no respiratory distress Cardio:  regular rate and rhythm, no LE edema Abdomen:  abdomen soft, nontender; bowel sounds normal; no masses or organomegaly Genital: Defer to GYN Musculoskeletal:  symmetrical muscle groups noted without atrophy or deformity Extremities:  no clubbing,  cyanosis, or edema, no deformities, no skin discoloration Neuro:  gait normal; deep tendon reflexes normal and symmetric Psych: well oriented with normal range of affect and appropriate judgment/insight  Assessment and Plan  Well adult exam - Plan: CBC, Comprehensive metabolic panel, Lipid panel  Postoperative hypothyroidism - Plan: TSH, T4, free  Snoring - Plan: Ambulatory referral to Pulmonology  Fatigue, unspecified type - Plan: Ambulatory referral to Pulmonology  Gastroesophageal reflux disease, unspecified whether esophagitis present - Plan: omeprazole (PRILOSEC) 40 MG capsule   Well 51 y.o. female. Counseled on diet and exercise. Advanced directive form provided today.  Other orders as above. Black Cohosh and soy rec'd for hot flashes. Could consider Effexor. Refer to sleep team for fatigue, mild OSA in past w SS around 5 yrs ago.  Increase Prilosec from 30 mg/d to 40 mg/d. F/u pending the above.  Follow up in 6 mo or prn. The patient voiced understanding and agreement to the plan.  [place], DO 08/27/21 9:21 AM

## 2021-09-03 ENCOUNTER — Other Ambulatory Visit: Payer: Self-pay | Admitting: Family Medicine

## 2021-09-03 ENCOUNTER — Encounter: Payer: Self-pay | Admitting: Family Medicine

## 2021-09-03 DIAGNOSIS — R319 Hematuria, unspecified: Secondary | ICD-10-CM

## 2021-09-06 ENCOUNTER — Encounter: Payer: Self-pay | Admitting: Adult Health

## 2021-09-06 ENCOUNTER — Ambulatory Visit: Payer: BC Managed Care – PPO | Admitting: Adult Health

## 2021-09-06 VITALS — BP 118/78 | HR 66 | Ht 68.0 in | Wt 201.6 lb

## 2021-09-06 DIAGNOSIS — R053 Chronic cough: Secondary | ICD-10-CM | POA: Diagnosis not present

## 2021-09-06 DIAGNOSIS — E663 Overweight: Secondary | ICD-10-CM

## 2021-09-06 DIAGNOSIS — R059 Cough, unspecified: Secondary | ICD-10-CM | POA: Insufficient documentation

## 2021-09-06 DIAGNOSIS — K219 Gastro-esophageal reflux disease without esophagitis: Secondary | ICD-10-CM | POA: Diagnosis not present

## 2021-09-06 DIAGNOSIS — G4733 Obstructive sleep apnea (adult) (pediatric): Secondary | ICD-10-CM | POA: Diagnosis not present

## 2021-09-06 NOTE — Assessment & Plan Note (Addendum)
Chronic cough x3 to 4 months seems to be worse at nighttime and with eating.  Suspect reflux is contributing.  Also may have some component of chronic rhinitis.  We will add in Pepcid and Claritin. Patient is a never smoker.  If symptoms are persisting when she comes back in for a visit consider further work-up with chest x-ray and PFTs if indicated  Plan  Patient Instructions  Home sleep study  Work on healthy weight loss Stop Benadryl.  Healthy sleep regimen  Continue on Prilosec '40mg'$  daily in am  Add Pepcid '20mg'$  At bedtime   Claritin '10mg'$  At bedtime   Delsym 2 tsp Twice daily  As needed cough  Follow up in 2 months to discuss results and treatment plan

## 2021-09-06 NOTE — Assessment & Plan Note (Signed)
Previous diagnosis of sleep apnea, snoring, restless sleep, daytime fatigue, BMI 30 all concerning for ongoing sleep apnea.  We will set patient up for home sleep study.  Patient education was given  - discussed how weight can impact sleep and risk for sleep disordered breathing - discussed options to assist with weight loss: combination of diet modification, cardiovascular and strength training exercises   - had an extensive discussion regarding the adverse health consequences related to untreated sleep disordered breathing - specifically discussed the risks for hypertension, coronary artery disease, cardiac dysrhythmias, cerebrovascular disease, and diabetes - lifestyle modification discussed   - discussed how sleep disruption can increase risk of accidents, particularly when driving - safe driving practices were discussed   Plan  Patient Instructions  Home sleep study  Work on healthy weight loss Stop Benadryl.  Healthy sleep regimen  Continue on Prilosec '40mg'$  daily in am  Add Pepcid '20mg'$  At bedtime   Claritin '10mg'$  At bedtime   Delsym 2 tsp Twice daily  As needed cough  Follow up in 2 months to discuss results and treatment plan

## 2021-09-06 NOTE — Assessment & Plan Note (Signed)
Healthy weight loss discussed 

## 2021-09-06 NOTE — Patient Instructions (Addendum)
Home sleep study  Work on healthy weight loss Stop Benadryl.  Healthy sleep regimen  Continue on Prilosec '40mg'$  daily in am  Add Pepcid '20mg'$  At bedtime   Claritin '10mg'$  At bedtime   Delsym 2 tsp Twice daily  As needed cough  Follow up in 2 months to discuss results and treatment plan

## 2021-09-06 NOTE — Assessment & Plan Note (Signed)
Increased GERD symptoms despite taking Prilosec daily.  GERD diet encouraged.  Add in Pepcid 20 mg at bedtime and continue on Prilosec in the morning.

## 2021-09-06 NOTE — Progress Notes (Signed)
$'@Patient'p$  ID: [name], female    DOB: [birth year], 51 y.o.   MRN: [id number]  Chief Complaint  Patient presents with   Consult    Referring provider: Shelda Pal*  HPI: 51 year old female seen for sleep consult September 06, 2021 to reestablish for sleep apnea  TEST/EVENTS :  11/10/2013 mild OSA, with an AHI of 10 events per hour.      09/06/2021 Sleep consult  Patient presents for a sleep consult today.  Kindly referred by her primary care provider Dr. Nani Ravens.  Patient complains of snoring, daytime sleepiness, fatigue, restless sleep, teeth grinding that has been going on for years.  Patient was seen in 2015 for a sleep consult.  She was set up for home sleep study that was done in 2015 that showed mild sleep apnea with AHI at 10/hour.  She was recommended on weight loss, positional sleep.  Patient says she worked on weight loss was able to lose 60 pounds.  She has gained some of it back.  But did not help her sleep at all.  She continued to feel ongoing tiredness and fatigue.  Since last visit patient did have a diagnosis of breast cancer.  She had a bilateral mastectomy.  Did not require chemo or radiation.  Patient says she is very active exercises several days a week. On average drinks about 2 to 3 cups of tea daily.  Takes melatonin or Benadryl to help with sleep.  Typically goes to bed about 9 PM.  Takes about 45 minutes to go to sleep.  Is up 3 times each night.  Is up about 5 AM. Notes suspicious for cataplexy or sleep paralysis.  No history of congestive heart failure or stroke.  Epworth score is 5 out of 24.  Typically gets sleepy if she sits down to watch TV and in the afternoon hours.  She does complain of increased reflux symptoms despite taking Prilosec 40 mg daily.  Has a dry cough that is worse after eating and at bedtime.  Cough keeps her up at night.  Also feels that she has some drainage in her throat.  Cough has been going on for several months.   And is aggravating her sleep.  Medical history significant for coronary artery dissection-previous history of acute coronary syndrome, coronary artery dissection of the right CEA.  She underwent a single-vessel CABG.,  Hypertension, breast cancer, previous diagnosis sleep apnea in 2015, GERD, thyroid cancer status post thyroidectomy  Surgical history.  Bilateral mastectomy in 2017, coronary bypass in 2012  Family history positive for heart disease, AAA, thyroid and breast cancer  Social history patient is married.  Has 2 children that are adults age 35 and 54.  She works as a Dance movement psychotherapist.  She says she has a sedentary job.  Lives with her spouse at home.  Is a never smoker.  Has very rare alcohol intake.  No previous drug use.  No Known Allergies  Immunization History  Administered Date(s) Administered   Influenza,inj,Quad PF,6+ Mos 11/01/2012, 10/22/2015, 10/13/2018   Influenza-Unspecified 10/14/2014, 11/11/2016, 09/13/2017, 09/29/2019, 10/14/2019   Moderna Covid-19 Vaccine Bivalent Booster 19yr & up 11/06/2020   PFIZER(Purple Top)SARS-COV-2 Vaccination 03/31/2019, 04/25/2019, 12/16/2019   Tdap 06/11/2013   Zoster Recombinat (Shingrix) 08/22/2019, 02/22/2020    Past Medical History:  Diagnosis Date   Allergy    Arthritis    Blood transfusion without reported diagnosis    breast cancer 2016   [place]   [place] [facility] doctors  CAD (coronary artery disease)    open heart surgery 09/12/10   Cancer [facility]) 2007   thyroid   Family hx of colon cancer    GERD (gastroesophageal reflux disease)    Gilbert disease    Heart disease    History of thyroid cancer    Hypothyroid    Overweight(278.02)    PONV (postoperative nausea and vomiting)     Tobacco History: Social History   Tobacco Use  Smoking Status Never  Smokeless Tobacco Never   Counseling given: Not Answered   Outpatient Medications Prior to Visit  Medication Sig Dispense Refill   levonorgestrel  (MIRENA) 20 MCG/24HR IUD 1 each by Intrauterine route once.     levothyroxine (SYNTHROID) 150 MCG tablet TAKE 1 TABLET(150 MCG) BY MOUTH EVERY MORNING 90 tablet 3   losartan (COZAAR) 25 MG tablet Take 1 tablet (25 mg total) by mouth daily. 90 tablet 3   metoprolol succinate (TOPROL-XL) 25 MG 24 hr tablet TAKE 1 TABLET BY MOUTH. PLEASE KEEP UPCOMING APPT IN NOVEMVER 2022 WITH DOCTOR COOPER BEFORE ANYMORE REFILLS. 90 tablet 2   omeprazole (PRILOSEC) 40 MG capsule Take 1 capsule (40 mg total) by mouth daily. 30 capsule 3   chlorpheniramine (CHLOR-TRIMETON) 4 MG tablet Take 1 tablet (4 mg total) by mouth 2 (two) times daily as needed for allergies. 14 tablet 0   No facility-administered medications prior to visit.     Review of Systems:   Constitutional:   No  weight loss, night sweats,  Fevers, chills,  +fatigue, or  lassitude.  HEENT:   No headaches,  Difficulty swallowing,  Tooth/dental problems, or  Sore throat,                No sneezing, itching, ear ache, nasal congestion, post nasal drip,   CV:  No chest pain,  Orthopnea, PND, swelling in lower extremities, anasarca, dizziness, palpitations, syncope.   GI  No heartburn, indigestion, abdominal pain, nausea, vomiting, diarrhea, change in bowel habits, loss of appetite, bloody stools.   Resp: .  No chest wall deformity  Skin: no rash or lesions.  GU: no dysuria, change in color of urine, no urgency or frequency.  No flank pain, no hematuria   MS:  No joint pain or swelling.  No decreased range of motion.  No back pain.    Physical Exam  BP 118/78 (BP Location: Left Arm, Cuff Size: Normal)   Pulse 66   Ht '5\' 8"'$  (1.727 m)   Wt 201 lb 9.6 oz (91.4 kg)   SpO2 99%   BMI 30.65 kg/m   GEN: A/Ox3; pleasant , NAD, well nourished    HEENT:  [place]/AT,   NOSE-clear, THROAT-clear, no lesions, no postnasal drip or exudate noted.  Last 2-3 MP airway  NECK:  Supple w/ fair ROM; no JVD; normal carotid impulses w/o bruits; no thyromegaly  or nodules palpated; no lymphadenopathy.    RESP  Clear  P & A; w/o, wheezes/ rales/ or rhonchi. no accessory muscle use, no dullness to percussion  CARD:  RRR, no m/r/g, no peripheral edema, pulses intact, no cyanosis or clubbing.  GI:   Soft & nt; nml bowel sounds; no organomegaly or masses detected.   Musco: Warm bil, no deformities or joint swelling noted.   Neuro: alert, no focal deficits noted.    Skin: Warm, no lesions or rashes    Lab Results:  CBC    Component Value Date/Time   WBC 5.5 08/27/2021 0757  RBC 4.45 08/27/2021 0757   HGB 10.8 (L) 08/27/2021 0757   HGB 12.1 11/12/2020 1257   HGB 13.6 10/21/2016 0846   HCT 33.6 (L) 08/27/2021 0757   HCT 36.4 11/12/2020 1257   HCT 39.9 10/21/2016 0846   PLT 230.0 08/27/2021 0757   PLT 270 11/12/2020 1257   MCV 75.5 (L) 08/27/2021 0757   MCV 79 11/12/2020 1257   MCV 86.7 10/21/2016 0846   MCH 26.2 11/27/2020 1523   MCHC 32.1 08/27/2021 0757   RDW 15.0 08/27/2021 0757   RDW 12.8 11/12/2020 1257   RDW 12.4 10/21/2016 0846   LYMPHSABS 1.8 11/27/2020 1523   LYMPHSABS 1.4 10/21/2016 0846   MONOABS 0.6 11/27/2020 1523   MONOABS 0.6 10/21/2016 0846   EOSABS 0.0 11/27/2020 1523   EOSABS 0.0 10/21/2016 0846   BASOSABS 0.1 11/27/2020 1523   BASOSABS 0.0 10/21/2016 0846    BMET    Component Value Date/Time   NA 139 08/27/2021 0757   NA 141 01/09/2021 0949   NA 139 10/21/2016 0846   K 4.1 08/27/2021 0757   K 4.2 10/21/2016 0846   CL 107 08/27/2021 0757   CO2 23 08/27/2021 0757   CO2 24 10/21/2016 0846   GLUCOSE 91 08/27/2021 0757   GLUCOSE 91 10/21/2016 0846   BUN 18 08/27/2021 0757   BUN 14 01/09/2021 0949   BUN 10.1 10/21/2016 0846   CREATININE 1.07 08/27/2021 0757   CREATININE 0.94 09/22/2019 0736   CREATININE 1.0 10/21/2016 0846   CALCIUM 9.9 08/27/2021 0757   CALCIUM 9.1 10/21/2016 0846   GFRNONAA >60 11/27/2020 1523   GFRNONAA >60 12/08/2017 0902   GFRAA >60 12/08/2017 0902    BNP No results  found for: "BNP"  ProBNP No results found for: "PROBNP"  Imaging: No results found.        No data to display          No results found for: "NITRICOXIDE"      Assessment & Plan:   OSA (obstructive sleep apnea) Previous diagnosis of sleep apnea, snoring, restless sleep, daytime fatigue, BMI 30 all concerning for ongoing sleep apnea.  We will set patient up for home sleep study.  Patient education was given  - discussed how weight can impact sleep and risk for sleep disordered breathing - discussed options to assist with weight loss: combination of diet modification, cardiovascular and strength training exercises   - had an extensive discussion regarding the adverse health consequences related to untreated sleep disordered breathing - specifically discussed the risks for hypertension, coronary artery disease, cardiac dysrhythmias, cerebrovascular disease, and diabetes - lifestyle modification discussed   - discussed how sleep disruption can increase risk of accidents, particularly when driving - safe driving practices were discussed   Plan  Patient Instructions  Home sleep study  Work on healthy weight loss Stop Benadryl.  Healthy sleep regimen  Continue on Prilosec '40mg'$  daily in am  Add Pepcid '20mg'$  At bedtime   Claritin '10mg'$  At bedtime   Delsym 2 tsp Twice daily  As needed cough  Follow up in 2 months to discuss results and treatment plan        GERD (gastroesophageal reflux disease) Increased GERD symptoms despite taking Prilosec daily.  GERD diet encouraged.  Add in Pepcid 20 mg at bedtime and continue on Prilosec in the morning.  Overweight Healthy weight loss discussed  Cough Chronic cough x3 to 4 months seems to be worse at nighttime and with eating.  Suspect reflux is  contributing.  Also may have some component of chronic rhinitis.  We will add in Pepcid and Claritin. Patient is a never smoker.  If symptoms are persisting when she comes back in for  a visit consider further work-up with chest x-ray and PFTs if indicated  Plan  Patient Instructions  Home sleep study  Work on healthy weight loss Stop Benadryl.  Healthy sleep regimen  Continue on Prilosec '40mg'$  daily in am  Add Pepcid '20mg'$  At bedtime   Claritin '10mg'$  At bedtime   Delsym 2 tsp Twice daily  As needed cough  Follow up in 2 months to discuss results and treatment plan          Rexene Edison, NP 09/06/2021

## 2021-09-10 ENCOUNTER — Other Ambulatory Visit (INDEPENDENT_AMBULATORY_CARE_PROVIDER_SITE_OTHER): Payer: BC Managed Care – PPO

## 2021-09-10 DIAGNOSIS — R748 Abnormal levels of other serum enzymes: Secondary | ICD-10-CM

## 2021-09-10 DIAGNOSIS — D72818 Other decreased white blood cell count: Secondary | ICD-10-CM

## 2021-09-10 DIAGNOSIS — E039 Hypothyroidism, unspecified: Secondary | ICD-10-CM | POA: Diagnosis not present

## 2021-09-10 DIAGNOSIS — R319 Hematuria, unspecified: Secondary | ICD-10-CM | POA: Diagnosis not present

## 2021-09-10 DIAGNOSIS — E890 Postprocedural hypothyroidism: Secondary | ICD-10-CM | POA: Diagnosis not present

## 2021-09-10 LAB — URINALYSIS, MICROSCOPIC ONLY

## 2021-09-10 LAB — CBC
HCT: 32.4 % — ABNORMAL LOW (ref 36.0–46.0)
Hemoglobin: 10.7 g/dL — ABNORMAL LOW (ref 12.0–15.0)
MCHC: 33 g/dL (ref 30.0–36.0)
MCV: 73.4 fl — ABNORMAL LOW (ref 78.0–100.0)
Platelets: 211 10*3/uL (ref 150.0–400.0)
RBC: 4.42 Mil/uL (ref 3.87–5.11)
RDW: 15.1 % (ref 11.5–15.5)
WBC: 4.9 10*3/uL (ref 4.0–10.5)

## 2021-09-10 LAB — HEPATIC FUNCTION PANEL
ALT: 42 U/L — ABNORMAL HIGH (ref 0–35)
AST: 32 U/L (ref 0–37)
Albumin: 4.2 g/dL (ref 3.5–5.2)
Alkaline Phosphatase: 137 U/L — ABNORMAL HIGH (ref 39–117)
Bilirubin, Direct: 0.2 mg/dL (ref 0.0–0.3)
Total Bilirubin: 1.2 mg/dL (ref 0.2–1.2)
Total Protein: 6.9 g/dL (ref 6.0–8.3)

## 2021-09-11 ENCOUNTER — Other Ambulatory Visit: Payer: Self-pay | Admitting: Family Medicine

## 2021-09-11 DIAGNOSIS — D509 Iron deficiency anemia, unspecified: Secondary | ICD-10-CM

## 2021-09-11 DIAGNOSIS — K635 Polyp of colon: Secondary | ICD-10-CM

## 2021-09-11 LAB — IRON,TIBC AND FERRITIN PANEL
%SAT: 7 % (calc) — ABNORMAL LOW (ref 16–45)
Ferritin: 4 ng/mL — ABNORMAL LOW (ref 16–232)
Iron: 35 ug/dL — ABNORMAL LOW (ref 45–160)
TIBC: 469 mcg/dL (calc) — ABNORMAL HIGH (ref 250–450)

## 2021-09-11 LAB — T4: T4, Total: 9.6 ug/dL (ref 5.1–11.9)

## 2021-09-20 ENCOUNTER — Ambulatory Visit: Payer: BC Managed Care – PPO | Admitting: Gastroenterology

## 2021-09-20 ENCOUNTER — Other Ambulatory Visit: Payer: BC Managed Care – PPO

## 2021-09-20 ENCOUNTER — Encounter: Payer: Self-pay | Admitting: Gastroenterology

## 2021-09-20 VITALS — BP 122/68 | HR 60 | Ht 67.0 in | Wt 204.4 lb

## 2021-09-20 DIAGNOSIS — D509 Iron deficiency anemia, unspecified: Secondary | ICD-10-CM

## 2021-09-20 DIAGNOSIS — Z86010 Personal history of colonic polyps: Secondary | ICD-10-CM

## 2021-09-20 DIAGNOSIS — K219 Gastro-esophageal reflux disease without esophagitis: Secondary | ICD-10-CM

## 2021-09-20 DIAGNOSIS — R7989 Other specified abnormal findings of blood chemistry: Secondary | ICD-10-CM

## 2021-09-20 DIAGNOSIS — Z860101 Personal history of adenomatous and serrated colon polyps: Secondary | ICD-10-CM

## 2021-09-20 MED ORDER — FERROUS SULFATE 325 (65 FE) MG PO TABS: 325.0000 mg | ORAL_TABLET | Freq: Two times a day (BID) | ORAL | 3 refills | Status: DC

## 2021-09-20 MED ORDER — NA SULFATE-K SULFATE-MG SULF 17.5-3.13-1.6 GM/177ML PO SOLN
1.0000 | Freq: Once | ORAL | 0 refills | Status: AC
Start: 2021-09-20 — End: 2021-09-20

## 2021-09-20 MED ORDER — OMEPRAZOLE 40 MG PO CPDR: 40.0000 mg | DELAYED_RELEASE_CAPSULE | Freq: Two times a day (BID) | ORAL | 11 refills | Status: DC

## 2021-09-20 NOTE — Patient Instructions (Signed)
Your provider has requested that you go to the basement level for lab work before leaving today. Press "B" on the elevator. The lab is located at the first door on the left as you exit the elevator.  We have sent the following medications to your pharmacy for you to pick up at your convenience: omeprazole 40 mg twice daily and ferrous sulfate 325 mg twice daily.   Please hold all blood donations until anemia is resolved.   You have been scheduled for an endoscopy and colonoscopy. Please follow the written instructions given to you at your visit today. Please pick up your prep supplies at the pharmacy within the next 1-3 days. If you use inhalers (even only as needed), please bring them with you on the day of your procedure.  The [facility] providers would like to encourage you to use [facility] to communicate with providers for non-urgent requests or questions.  Due to long hold times on the telephone, sending your provider a message by [facility] may be a faster and more efficient way to get a response.  Please allow 48 business hours for a response.  Please remember that this is for non-urgent requests.   Due to recent changes in healthcare laws, you may see the results of your imaging and laboratory studies on MyChart before your provider has had a chance to review them.  We understand that in some cases there may be results that are confusing or concerning to you. Not all laboratory results come back in the same time frame and the provider may be waiting for multiple results in order to interpret others.  Please give [place] 48 hours in order for your provider to thoroughly review all the results before contacting the office for clarification of your results.   Thank you for choosing me and [facility].  Pricilla Riffle. Dagoberto Ligas., MD., [facility] Regal

## 2021-09-20 NOTE — Progress Notes (Signed)
Assessment     Iron deficiency anemia. Likely related to long term blood donation. 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 Start FeSO4 325 mg po bid with food.  Increase omeprazole to 40 mg p.o. twice daily, closely follow antireflux measures, and continue Pepcid AC at bedtime Schedule colonoscopy and EGD. The risks (including bleeding, perforation, infection, missed lesions, medication reactions and possible hospitalization or surgery if complications occur), benefits, and alternatives to colonoscopy with possible biopsy and possible polypectomy were discussed with the patient and they consent to proceed.  The risks (including bleeding, perforation, infection, missed lesions, medication reactions and possible hospitalization or surgery if complications occur), benefits, and alternatives to endoscopy with possible biopsy and possible dilation were discussed with the patient and they consent to proceed.   Monitor LFTs Continue annual abd MRIs followed by Oncology    HPI    This is a 51 year old female referred by Riki Sheer, DO for iron deficiency anemia. She is a long term blood donor.  Recent hemoglobin = 10.7, ferritin = 4.  She relates a cough that occurs frequently after eating.  It has been present for 3 to 4 years.  She recently increased from omeprazole from 20 mg daily to 40 mg daily without a change in symptoms.  No family history of celiac disease.  She has mildly elevated LFTs.  Prior liver biopsy in 2012 showed minimal nonspecific portal inflammation without steatosis.  Recently her AST and alk phos were minimally elevated.  She has abdominal MRIs performed yearly for pancreatic cancer screening and no hepatic or biliary abnormalities were  noted on her most recent abdominal MRI.   Colonoscopy Feb 2021 - One 12 mm polyp in the cecum, removed with a hot snare. Resected and retrieved. - Two 4 to 7 mm polyps in the transverse colon and in the cecum, removed with a cold snare. Resected and retrieved. - One 4 mm polyp in the ascending colon, removed with a cold biopsy forceps. Resected and retrieved. - One 8 mm polyp in the descending colon, removed with a hot snare. Resected and retrieved. - The examination was otherwise normal on direct and retroflexion views. Path: TAs, SSP  Labs / Imaging       Latest Ref Rng & Units 09/10/2021    7:31 AM 08/27/2021    7:57 AM 11/27/2020    3:23 PM  Hepatic Function  Total Protein 6.0 - 8.3 g/dL 6.9  6.7  7.2   Albumin 3.5 - 5.2 g/dL 4.2  4.2  4.0   AST 0 - 37 U/L 32  40  31   ALT 0 - 35 U/L 42  41  28   Alk Phosphatase 39 - 117 U/L 137  154  119   Total Bilirubin 0.2 - 1.2 mg/dL 1.2  1.0  0.8   Bilirubin, Direct 0.0 - 0.3 mg/dL 0.2          Latest Ref Rng & Units 09/10/2021    7:31 AM 08/27/2021    7:57 AM 11/27/2020    3:23 PM  CBC  WBC 4.0 - 10.5 K/uL 4.9  5.5  7.8   Hemoglobin 12.0 - 15.0 g/dL 10.7  10.8  12.1   Hematocrit 36.0 - 46.0 % 32.4  33.6  37.0   Platelets 150.0 - 400.0 K/uL 211.0  230.0  270     Current Medications, Allergies, Past Medical History, Past Surgical History, Family History and Social History were reviewed in [employer] record.   Physical Exam: General: Well developed, well nourished, no acute distress Head: Normocephalic and atraumatic Eyes: Sclerae anicteric, EOMI Ears: Normal auditory acuity Mouth: Not examined Lungs: Clear throughout to auscultation Heart: Regular rate and rhythm; no murmurs, rubs or bruits Abdomen: Soft, non tender and non distended. No masses, hepatosplenomegaly or hernias noted. Normal Bowel sounds Rectal: Musculoskeletal: Symmetrical with no gross deformities  Pulses:  Normal pulses  noted Extremities: No clubbing, cyanosis, edema or deformities noted Neurological: Alert oriented x 4, grossly nonfocal Psychological:  Alert and cooperative. Normal mood and affect   [name] T. Fuller Plan, MD 09/20/2021, 10:18 AM

## 2021-09-24 LAB — TISSUE TRANSGLUTAMINASE, IGA: (tTG) Ab, IgA: <1 U/mL

## 2021-09-24 LAB — IGA: Immunoglobulin A: 227 mg/dL (ref 47–310)

## 2021-10-01 DIAGNOSIS — H40033 Anatomical narrow angle, bilateral: Secondary | ICD-10-CM | POA: Diagnosis not present

## 2021-10-01 DIAGNOSIS — H3563 Retinal hemorrhage, bilateral: Secondary | ICD-10-CM | POA: Diagnosis not present

## 2021-10-01 LAB — HM DIABETES EYE EXAM

## 2021-10-17 ENCOUNTER — Encounter: Payer: Self-pay | Admitting: Family Medicine

## 2021-10-17 ENCOUNTER — Ambulatory Visit (INDEPENDENT_AMBULATORY_CARE_PROVIDER_SITE_OTHER): Payer: BC Managed Care – PPO | Admitting: Family Medicine

## 2021-10-17 VITALS — BP 123/80 | HR 59 | Ht 68.0 in | Wt 205.0 lb

## 2021-10-17 DIAGNOSIS — N951 Menopausal and female climacteric states: Secondary | ICD-10-CM

## 2021-10-17 DIAGNOSIS — Z975 Presence of (intrauterine) contraceptive device: Secondary | ICD-10-CM | POA: Diagnosis not present

## 2021-10-17 DIAGNOSIS — D0511 Intraductal carcinoma in situ of right breast: Secondary | ICD-10-CM

## 2021-10-17 DIAGNOSIS — Z85850 Personal history of malignant neoplasm of thyroid: Secondary | ICD-10-CM

## 2021-10-17 DIAGNOSIS — Z01419 Encounter for gynecological examination (general) (routine) without abnormal findings: Secondary | ICD-10-CM | POA: Diagnosis not present

## 2021-10-17 DIAGNOSIS — Z1509 Genetic susceptibility to other malignant neoplasm: Secondary | ICD-10-CM | POA: Diagnosis not present

## 2021-10-17 NOTE — Progress Notes (Signed)
ANNUAL EXAM Patient name: [name]  MRN [id number]  Date of birth: Feb 29, 1970 Chief Complaint:   Annual Exam  History of Present Illness:   [name] is a 51 y.o. female  being seen today for a routine annual exam.  Current complaints: not having any periods currently. Has vasomotor symptoms - night sweats. Waking up several times at night. Is going for sleep study to see if has sleep apnea. Did try black kohosh - might have some improvement.   Has increased stress - mom has colon and ovarian cancer. Going through chemo now.    No LMP recorded. (Menstrual status: IUD).   Last pap 2022. Results were: NILM w/ HRHPV negative. H/O abnormal pap: no Last mammogram: h/o double mastectomy. Last colonoscopy: having a colonoscopy in the next month. Family h/o colorectal cancer: yes mother     08/27/2021    7:29 AM 02/07/2021    1:53 PM 08/22/2020    7:05 AM 01/16/2020    3:06 PM 10/22/2015    4:03 PM  Depression screen PHQ 2/9  Decreased Interest 0 0 0 0 0  Down, Depressed, Hopeless 0 0 0 0 0  PHQ - 2 Score 0 0 0 0 0  Altered sleeping 0      Tired, decreased energy 0      Change in appetite 0      Feeling bad or failure about yourself  0      Trouble concentrating 0      Moving slowly or fidgety/restless 0      Suicidal thoughts 0      PHQ-9 Score 0      Difficult doing work/chores Not difficult at all             No data to display           Review of Systems:   Pertinent items are noted in HPI Denies any headaches, blurred vision, fatigue, shortness of breath, chest pain, abdominal pain, abnormal vaginal discharge/itching/odor/irritation, problems with periods, bowel movements, urination, or intercourse unless otherwise stated above. Pertinent History Reviewed:  Reviewed past medical,surgical, social and family history.  Reviewed problem list, medications and allergies. Physical Assessment:   Vitals:   10/17/21 1045  BP: 123/80  Pulse: (!) 59   Weight: 205 lb (93 kg)  Height: '5\' 8"'$  (1.727 m)  Body mass index is 31.17 kg/m.        Physical Examination:   General appearance - well appearing, and in no distress  Mental status - alert, oriented to person, place, and time  Psych:  She has a normal mood and affect  Skin - warm and dry, normal color, no suspicious lesions noted  Chest - effort normal, all lung fields clear to auscultation bilaterally  Heart - normal rate and regular rhythm  Neck:  midline trachea, no thyromegaly or nodules  Breasts - breasts appear normal, no suspicious masses, no skin or nipple changes or axillary nodes  Abdomen - soft, nontender, nondistended, no masses or organomegaly  Pelvic - VULVA: normal appearing vulva with no masses, tenderness or lesions  VAGINA: normal appearing vagina with normal color and discharge, no lesions  CERVIX: normal appearing cervix without discharge or lesions, no CMT - strings visualized  UTERUS: uterus is felt to be normal size, shape, consistency and nontender   ADNEXA: No adnexal masses or tenderness noted.  Extremities:  No swelling or varicosities noted  Chaperone present for exam  Assessment & Plan:  1. Well woman exam with routine gynecological exam PAP not done this year. No concerns  2. IUD (intrauterine device) in place Strings seen  3. Ductal carcinoma in situ (DCIS) of right breast S/p mastectomy.  4. Genetic predisposition to cancer Continues to see oncology. Gets MRI/MRCP annually  5. History of thyroid cancer S/p thyroidectomy.   6. Vasomotor symptoms due to menopause Discussed symptomatic control with paroxetine or venlafaxine   Labs/procedures today: none   No orders of the defined types were placed in this encounter.   Meds: No orders of the defined types were placed in this encounter.   Follow-up: No follow-ups on file.  Truett Mainland, DO 10/17/2021 11:24 AM

## 2021-10-22 ENCOUNTER — Encounter: Payer: Self-pay | Admitting: Family Medicine

## 2021-11-05 ENCOUNTER — Encounter: Payer: Self-pay | Admitting: Gastroenterology

## 2021-11-06 ENCOUNTER — Ambulatory Visit: Payer: BC Managed Care – PPO | Admitting: Adult Health

## 2021-11-07 ENCOUNTER — Encounter: Payer: Self-pay | Admitting: Certified Registered Nurse Anesthetist

## 2021-11-07 DIAGNOSIS — H40013 Open angle with borderline findings, low risk, bilateral: Secondary | ICD-10-CM | POA: Diagnosis not present

## 2021-11-12 ENCOUNTER — Encounter: Payer: Self-pay | Admitting: Gastroenterology

## 2021-11-12 ENCOUNTER — Ambulatory Visit (AMBULATORY_SURGERY_CENTER): Payer: BC Managed Care – PPO | Admitting: Gastroenterology

## 2021-11-12 VITALS — BP 116/76 | HR 57 | Temp 96.0°F | Resp 11 | Ht 67.0 in | Wt 204.0 lb

## 2021-11-12 DIAGNOSIS — Z86010 Personal history of colonic polyps: Secondary | ICD-10-CM

## 2021-11-12 DIAGNOSIS — K633 Ulcer of intestine: Secondary | ICD-10-CM

## 2021-11-12 DIAGNOSIS — K5289 Other specified noninfective gastroenteritis and colitis: Secondary | ICD-10-CM | POA: Diagnosis not present

## 2021-11-12 DIAGNOSIS — K2970 Gastritis, unspecified, without bleeding: Secondary | ICD-10-CM

## 2021-11-12 DIAGNOSIS — D509 Iron deficiency anemia, unspecified: Secondary | ICD-10-CM

## 2021-11-12 DIAGNOSIS — K2950 Unspecified chronic gastritis without bleeding: Secondary | ICD-10-CM | POA: Diagnosis not present

## 2021-11-12 DIAGNOSIS — D123 Benign neoplasm of transverse colon: Secondary | ICD-10-CM

## 2021-11-12 MED ORDER — SODIUM CHLORIDE 0.9 % IV SOLN: 500.0000 mL | Freq: Once | INTRAVENOUS | Status: DC

## 2021-11-12 NOTE — Patient Instructions (Signed)
Await pathology results.  Handout on polyps provided.  YOU HAD AN ENDOSCOPIC PROCEDURE TODAY AT [facility]:   Refer to the procedure report that was given to you for any specific questions about what was found during the examination.  If the procedure report does not answer your questions, please call your gastroenterologist to clarify.  If you requested that your care partner not be given the details of your procedure findings, then the procedure report has been included in a sealed envelope for you to review at your convenience later.  YOU SHOULD EXPECT: Some feelings of bloating in the abdomen. Passage of more gas than usual.  Walking can help get rid of the air that was put into your GI tract during the procedure and reduce the bloating. If you had a lower endoscopy (such as a colonoscopy or flexible sigmoidoscopy) you may notice spotting of blood in your stool or on the toilet paper. If you underwent a bowel prep for your procedure, you may not have a normal bowel movement for a few days.  Please Note:  You might notice some irritation and congestion in your nose or some drainage.  This is from the oxygen used during your procedure.  There is no need for concern and it should clear up in a day or so.  SYMPTOMS TO REPORT IMMEDIATELY:  Following lower endoscopy (colonoscopy or flexible sigmoidoscopy):  Excessive amounts of blood in the stool  Significant tenderness or worsening of abdominal pains  Swelling of the abdomen that is new, acute  Fever of 100F or higher  Following upper endoscopy (EGD)  Vomiting of blood or coffee ground material  New chest pain or pain under the shoulder blades  Painful or persistently difficult swallowing  New shortness of breath  Fever of 100F or higher  Black, tarry-looking stools  For urgent or emergent issues, a gastroenterologist can be reached at any hour by calling [phone]. Do not use MyChart messaging for urgent concerns.     DIET:  We do recommend a small meal at first, but then you may proceed to your regular diet.  Drink plenty of fluids but you should avoid alcoholic beverages for 24 hours.  ACTIVITY:  You should plan to take it easy for the rest of today and you should NOT DRIVE or use heavy machinery until tomorrow (because of the sedation medicines used during the test).    FOLLOW UP: Our staff will call the number listed on your records the next business day following your procedure.  We will call around 7:15- 8:00 am to check on you and address any questions or concerns that you may have regarding the information given to you following your procedure. If we do not reach you, we will leave a message.     If any biopsies were taken you will be contacted by phone or by letter within the next 1-3 weeks.  Please call us at [phone] if you have not heard about the biopsies in 3 weeks.    SIGNATURES/CONFIDENTIALITY: You and/or your care partner have signed paperwork which will be entered into your electronic medical record.  These signatures attest to the fact that that the information above on your After Visit Summary has been reviewed and is understood.  Full responsibility of the confidentiality of this discharge information lies with you and/or your care-partner.

## 2021-11-12 NOTE — Progress Notes (Signed)
History & Physical  Primary Care Physician:  Shelda Pal, DO Primary Gastroenterologist: Lucio Edward, MD  CHIEF COMPLAINT:  IDA, Personal history of colon polyps   HPI: [name] is a 51 y.o. female with iron deficiency anemia and a personal history of adenomatous colon polyps for colonoscopy and EGD.   Past Medical History:  Diagnosis Date   Allergy    Anemia    IDA   Arthritis    Blood transfusion without reported diagnosis    breast cancer 2016   Breast cancer ([place]) 12/2014   Seeing [facility] doctors   CAD (coronary artery disease)    open heart surgery 09/12/10   Cancer ([place]) 2007   thyroid   Family hx of colon cancer    GERD (gastroesophageal reflux disease)    Gilbert disease    Heart disease    History of thyroid cancer    Hypothyroid    Overweight(278.02)    PONV (postoperative nausea and vomiting)     Past Surgical History:  Procedure Laterality Date   arm surgery     rt   BILATERAL TOTAL MASTECTOMY WITH AXILLARY LYMPH NODE DISSECTION     COLONOSCOPY     coronary artery bypass grafting x1  09/13/2010   [place] [facility]   HERNIA REPAIR  1977   inguinal, ?side   KNEE ARTHROSCOPY Right 09/13/2015   pt reported   [place], 2002   UPPER GASTROINTESTINAL ENDOSCOPY      Prior to Admission medications   Medication Sig Start Date End Date Taking? Authorizing Provider  ferrous sulfate 325 (65 FE) MG tablet Take 1 tablet (325 mg total) by mouth 2 (two) times daily with a meal. 09/20/21  Yes Ladene Artist, MD  levothyroxine (SYNTHROID) 150 MCG tablet TAKE 1 TABLET(150 MCG) BY MOUTH EVERY MORNING 08/27/21  Yes Shelda Pal, DO  losartan (COZAAR) 25 MG tablet Take 1 tablet (25 mg total) by mouth daily. 12/11/20  Yes Sherren Mocha, MD  metoprolol succinate (TOPROL-XL) 25 MG 24 hr tablet TAKE 1 TABLET BY MOUTH. PLEASE KEEP UPCOMING APPT IN NOVEMVER 2022 WITH DOCTOR COOPER BEFORE ANYMORE REFILLS.  03/04/21  Yes Sherren Mocha, MD  omeprazole (PRILOSEC) 40 MG capsule Take 1 capsule (40 mg total) by mouth in the morning and at bedtime. 09/20/21  Yes Ladene Artist, MD  Famotidine (PEPCID AC PO) Take 1 tablet by mouth at bedtime.    [provider]  levonorgestrel (MIRENA) 20 MCG/24HR IUD 1 each by Intrauterine route once. 04/03/16   [provider]    Current Outpatient Medications  Medication Sig Dispense Refill   ferrous sulfate 325 (65 FE) MG tablet Take 1 tablet (325 mg total) by mouth 2 (two) times daily with a meal. 60 tablet 3   levothyroxine (SYNTHROID) 150 MCG tablet TAKE 1 TABLET(150 MCG) BY MOUTH EVERY MORNING 90 tablet 3   losartan (COZAAR) 25 MG tablet Take 1 tablet (25 mg total) by mouth daily. 90 tablet 3   metoprolol succinate (TOPROL-XL) 25 MG 24 hr tablet TAKE 1 TABLET BY MOUTH. PLEASE KEEP UPCOMING APPT IN NOVEMVER 2022 WITH DOCTOR COOPER BEFORE ANYMORE REFILLS. 90 tablet 2   omeprazole (PRILOSEC) 40 MG capsule Take 1 capsule (40 mg total) by mouth in the morning and at bedtime. 60 capsule 11   Famotidine (PEPCID AC PO) Take 1 tablet by mouth at bedtime.     levonorgestrel (MIRENA) 20 MCG/24HR IUD 1  each by Intrauterine route once.     Current Facility-Administered Medications  Medication Dose Route Frequency Provider Last Rate Last Admin   0.9 %  sodium chloride infusion  500 mL Intravenous Once Ladene Artist, MD        Allergies as of 11/12/2021   (No Known Allergies)    Family History  Problem Relation Age of Onset   Hypertension Mother    Colon cancer Mother    Arthritis Mother    Cancer Mother        breast   Breast cancer Mother    Ovarian cancer Mother        Thought to be metastatized from colon   Hypertension Father    Hiatal hernia Father    Hypertension Sister    Coronary artery disease Maternal Grandfather    Arthritis Paternal Grandmother    Diabetes Paternal Grandmother    Parkinson's disease Paternal Grandmother     Lupus Maternal Aunt    Heart disease Maternal Aunt    Diabetes Maternal Aunt    Cancer Maternal Aunt        breast, ovarian   Breast cancer Maternal Aunt    Diabetes Maternal Aunt    Other Maternal Aunt        ?auto immune disease   Colon cancer Maternal Uncle    Esophageal cancer Neg Hx    Rectal cancer Neg Hx    Stomach cancer Neg Hx     Social History   Socioeconomic History   Marital status: Married    Spouse name: Not on file   Number of children: 2   Years of education: 16   Highest education level: Not on file  Occupational History   Occupation: Building control surveyor: [employer]  Tobacco Use   Smoking status: Never   Smokeless tobacco: Never  Vaping Use   Vaping Use: Never used  Substance and Sexual Activity   Alcohol use: Yes    Comment: 1 per month   Drug use: No   Sexual activity: Yes    Birth control/protection: I.U.D.  Other Topics Concern   Not on file  Social History Narrative   Regular exercise-yes   Caffeine Use-yes   Works as Patent examiner   [phone]- son   Married   Enjoys Marine scientist and a Scientist, research (physical sciences) degree   Social Determinants of Radio broadcast assistant Strain: Not on Art therapist Insecurity: Not on file  Transportation Needs: Not on file  Physical Activity: Not on file  Stress: Not on file  Social Connections: Not on file  Intimate Partner Violence: Not on file    Review of Systems:  All systems reviewed were negative except where noted in HPI.   Physical Exam: General:  Alert, well-developed, in NAD Head:  Normocephalic and atraumatic. Eyes:  Sclera clear, no icterus.   Conjunctiva pink. Ears:  Normal auditory acuity. Mouth:  No deformity or lesions.  Neck:  Supple; no masses . Lungs:  Clear throughout to auscultation.   No wheezes, crackles, or rhonchi. No acute distress. Heart:  Regular rate and rhythm; no murmurs. Abdomen:  Soft, nondistended, nontender. No masses,  hepatomegaly. No obvious masses.  Normal bowel .    Rectal:  Deferred   Msk:  Symmetrical without gross deformities.. Pulses:  Normal pulses noted. Extremities:  Without edema. Neurologic:  Alert and  oriented x4;  grossly normal neurologically.  Skin:  Intact without significant lesions or rashes. Cervical Nodes:  No significant cervical adenopathy. Psych:  Alert and cooperative. Normal mood and affect.   Impression / Plan:   Iron deficiency anemia and a personal history of adenomatous colon polyps for colonoscopy and EGD.  Pricilla Riffle. Fuller Plan  11/12/2021, 1:37 PM See Shea Evans, [facility], to contact our on call provider

## 2021-11-12 NOTE — Progress Notes (Signed)
1310 Robinul 0.1 mg IV given due large amount of secretions upon assessment.  MD made aware, vss

## 2021-11-12 NOTE — Op Note (Signed)
[facility] Patient Name: [name] Procedure Date: 11/12/2021 1:36 PM MRN: [id number] Endoscopist: Ladene Artist , MD, [id number] Age: 51 Referring MD:  Date of Birth: 09/21/1970 Gender: Female Account #: [id number] Procedure:                Upper GI endoscopy Indications:              Unexplained iron deficiency anemia Medicines:                Monitored Anesthesia Care Procedure:                Pre-Anesthesia Assessment:                           - Prior to the procedure, a History and Physical                            was performed, and patient medications and                            allergies were reviewed. The patient's tolerance of                            previous anesthesia was also reviewed. The risks                            and benefits of the procedure and the sedation                            options and risks were discussed with the patient.                            All questions were answered, and informed consent                            was obtained. Prior Anticoagulants: The patient has                            taken no anticoagulant or antiplatelet agents. ASA                            Grade Assessment: III - A patient with severe                            systemic disease. After reviewing the risks and                            benefits, the patient was deemed in satisfactory                            condition to undergo the procedure.                           After obtaining informed consent, the endoscope was  passed under direct vision. Throughout the                            procedure, the patient's blood pressure, pulse, and                            oxygen saturations were monitored continuously. The                            GIF [id number] #[id number] was introduced through the                            mouth, and advanced to the second part of duodenum.                            The upper  GI endoscopy was accomplished without                            difficulty. The patient tolerated the procedure                            well. Scope In: Scope Out: Findings:                 The examined esophagus was normal.                           Patchy mildly erythematous mucosa without bleeding                            was found in the gastric body. Biopsies were taken                            with a cold forceps for histology.                           The exam of the stomach was otherwise normal.                           The duodenal bulb and second portion of the                            duodenum were normal. Complications:            No immediate complications. Estimated Blood Loss:     Estimated blood loss was minimal. Impression:               - Normal esophagus.                           - Erythematous mucosa in the gastric body. Biopsied.                           - Normal duodenal bulb and second portion of the  duodenum. Recommendation:           - Patient has a contact number available for                            emergencies. The signs and symptoms of potential                            delayed complications were discussed with the                            patient. Return to normal activities tomorrow.                            Written discharge instructions were provided to the                            patient.                           - Resume previous diet.                           - Continue present medications.                           - Await pathology results. Ladene Artist, MD 11/12/2021 2:17:20 PM This report has been signed electronically.

## 2021-11-12 NOTE — Op Note (Signed)
[facility] Patient Name: [name] Procedure Date: 11/12/2021 1:36 PM MRN: [id number] Endoscopist: Ladene Artist , MD, [id number] Age: 51 Referring MD:  Date of Birth: 09-04-1970 Gender: Female Account #: [id number] Procedure:                Colonoscopy Indications:              Unexplained iron deficiency anemia, Personal                            history of adenomatous colon polyps Medicines:                Monitored Anesthesia Care Procedure:                Pre-Anesthesia Assessment:                           - Prior to the procedure, a History and Physical                            was performed, and patient medications and                            allergies were reviewed. The patient's tolerance of                            previous anesthesia was also reviewed. The risks                            and benefits of the procedure and the sedation                            options and risks were discussed with the patient.                            All questions were answered, and informed consent                            was obtained. Prior Anticoagulants: The patient has                            taken no anticoagulant or antiplatelet agents. ASA                            Grade Assessment: III - A patient with severe                            systemic disease. After reviewing the risks and                            benefits, the patient was deemed in satisfactory                            condition to undergo the procedure.  After obtaining informed consent, the colonoscope                            was passed under direct vision. Throughout the                            procedure, the patient's blood pressure, pulse, and                            oxygen saturations were monitored continuously. The                            CF [id number] #[id number] was introduced through the anus                            and advanced to  the the terminal ileum, with                            identification of the appendiceal orifice and IC                            valve. The ileocecal valve, appendiceal orifice,                            and rectum were photographed. The quality of the                            bowel preparation was good. The colonoscopy was                            performed without difficulty. The patient tolerated                            the procedure well. Scope In: 1:40:13 PM Scope Out: 1:56:53 PM Scope Withdrawal Time: 0 hours 13 minutes 50 seconds  Total Procedure Duration: 0 hours 16 minutes 40 seconds  Findings:                 The perianal and digital rectal examinations were                            normal.                           The terminal ileum appeared normal.                           Multiple 15-20 mm ulcers were found in the cecum.                            No bleeding was present. No stigmata of recent                            bleeding were seen. Biopsies were taken with a cold  forceps for histology.                           A 7 mm polyp was found in the transverse colon. The                            polyp was sessile. The polyp was removed with a                            cold snare. Resection and retrieval were complete.                           The exam was otherwise without abnormality on                            direct and retroflexion views. Complications:            No immediate complications. Estimated blood loss:                            None. Estimated Blood Loss:     Estimated blood loss: none. Impression:               - The examined portion of the ileum was normal.                           - Multiple ulcers in the cecum. Biopsied.                           - One 7 mm polyp in the transverse colon, removed                            with a cold snare. Resected and retrieved.                           - The examination  was otherwise normal on direct                            and retroflexion views. Recommendation:           - Repeat colonoscopy after studies are complete for                            surveillance based on pathology results.                           - Patient has a contact number available for                            emergencies. The signs and symptoms of potential                            delayed complications were discussed with the  patient. Return to normal activities tomorrow.                            Written discharge instructions were provided to the                            patient.                           - Resume previous diet.                           - Continue present medications.                           - Await pathology results. Ladene Artist, MD 11/12/2021 2:09:34 PM This report has been signed electronically.

## 2021-11-12 NOTE — Progress Notes (Signed)
Report given to PACU, vss 

## 2021-11-12 NOTE — Progress Notes (Signed)
Called to room to assist during endoscopic procedure.  Patient ID and intended procedure confirmed with present staff. Received instructions for my participation in the procedure from the performing physician.  

## 2021-11-13 ENCOUNTER — Telehealth: Payer: Self-pay | Admitting: *Deleted

## 2021-11-13 NOTE — Telephone Encounter (Signed)
Order was placed on 8/25.  I am calling pt's now that order was placed on 8/11.  I should be able to call pt within the next couple of weeks.  Will route to triage so pt can be made aware thru [facility].

## 2021-11-13 NOTE — Telephone Encounter (Signed)
  Follow up Call-     11/12/2021   12:51 PM  Call back number  Post procedure Call Back phone  # [phone]  Permission to leave phone message Yes     Patient questions:  Do you have a fever, pain , or abdominal swelling? No. Pain Score  0 *  Have you tolerated food without any problems? Yes.    Have you been able to return to your normal activities? Yes.    Do you have any questions about your discharge instructions: Diet   No. Medications  No. Follow up visit  No.  Do you have questions or concerns about your Care? No.  Actions: * If pain score is 4 or above: No action needed, pain <4.

## 2021-11-19 ENCOUNTER — Other Ambulatory Visit: Payer: Self-pay | Admitting: Cardiovascular Disease

## 2021-11-20 ENCOUNTER — Other Ambulatory Visit: Payer: Self-pay | Admitting: Cardiovascular Disease

## 2021-11-25 ENCOUNTER — Ambulatory Visit: Payer: BC Managed Care – PPO

## 2021-11-25 DIAGNOSIS — G4733 Obstructive sleep apnea (adult) (pediatric): Secondary | ICD-10-CM | POA: Diagnosis not present

## 2021-11-25 DIAGNOSIS — L821 Other seborrheic keratosis: Secondary | ICD-10-CM | POA: Diagnosis not present

## 2021-11-25 DIAGNOSIS — D2261 Melanocytic nevi of right upper limb, including shoulder: Secondary | ICD-10-CM | POA: Diagnosis not present

## 2021-11-25 DIAGNOSIS — D225 Melanocytic nevi of trunk: Secondary | ICD-10-CM | POA: Diagnosis not present

## 2021-11-25 DIAGNOSIS — L814 Other melanin hyperpigmentation: Secondary | ICD-10-CM | POA: Diagnosis not present

## 2021-12-02 ENCOUNTER — Encounter: Payer: Self-pay | Admitting: Family Medicine

## 2021-12-03 ENCOUNTER — Encounter: Payer: Self-pay | Admitting: Gastroenterology

## 2021-12-04 ENCOUNTER — Telehealth: Payer: Self-pay | Admitting: Pulmonary Disease

## 2021-12-04 DIAGNOSIS — G4733 Obstructive sleep apnea (adult) (pediatric): Secondary | ICD-10-CM | POA: Diagnosis not present

## 2021-12-04 NOTE — Telephone Encounter (Signed)
Call patient  Sleep study result  Date of study: 11/25/2021  Impression: Mild obstructive sleep apnea No significant oxygen desaturations  Recommendation: Options of treatment for mild obstructive sleep apnea will include  1.  CPAP therapy if there is significant daytime sleepiness or other comorbidities including history of CVA or cardiac disease  -If CPAP is chosen as an option of treatment auto titrating CPAP with a pressure setting of 5-15 will be appropriate  2.  Watchful waiting with emphasis on weight loss measures, sleep position modification to optimize lateral sleep, elevating the head of the bed by about 30 degrees may also help.  3.  An oral device may be fashioned for the treatment of mild sleep disordered breathing, will involve referral to dentist.  Follow-up as previously scheduled

## 2021-12-09 DIAGNOSIS — M7542 Impingement syndrome of left shoulder: Secondary | ICD-10-CM | POA: Diagnosis not present

## 2021-12-09 DIAGNOSIS — M25512 Pain in left shoulder: Secondary | ICD-10-CM | POA: Diagnosis not present

## 2021-12-09 NOTE — Telephone Encounter (Signed)
Has ov on 12/11/21

## 2021-12-10 ENCOUNTER — Telehealth: Payer: Self-pay | Admitting: *Deleted

## 2021-12-10 NOTE — Telephone Encounter (Signed)
Called and spoke with patient, verified that patient is not currently wearing a CPAP machine.

## 2021-12-11 ENCOUNTER — Encounter: Payer: Self-pay | Admitting: Adult Health

## 2021-12-11 ENCOUNTER — Ambulatory Visit: Payer: BC Managed Care – PPO | Admitting: Adult Health

## 2021-12-11 VITALS — BP 106/82 | HR 59 | Temp 97.9°F | Ht 68.0 in | Wt 209.8 lb

## 2021-12-11 DIAGNOSIS — E663 Overweight: Secondary | ICD-10-CM

## 2021-12-11 DIAGNOSIS — G4733 Obstructive sleep apnea (adult) (pediatric): Secondary | ICD-10-CM

## 2021-12-11 NOTE — Assessment & Plan Note (Signed)
Mild obstructive sleep apnea.  Patient would like to proceed with referral for oral appliance.  Patient education was given  Plan  Patient Instructions  Referral to Dr. Ron Parker - [phone] for oral appliance evaluation.  Healthy sleep regimen  Do not drive if sleepy  Follow up in 6 months and As needed

## 2021-12-11 NOTE — Progress Notes (Signed)
$'@Patient'g$  ID: [name], female    DOB: 1970-03-05, 51 y.o.   MRN: [id number]  Chief Complaint  Patient presents with   Follow-up    Referring provider: Shelda Pal*  HPI: 51 year old female seen for sleep consult September 06, 2021 to reestablish for sleep apnea History of breast cancer, coronary artery dissection, acute coronary syndrome status post CABG. TEST/EVENTS :  11/10/2013 mild OSA, with an AHI of 10 events per hour.  November 25, 2021 Home sleep study shows mild obstructive sleep apnea with minimum desaturations    12/11/2021 Follow up : OSA  Patient returns for a 58-monthfollow-up.  Patient was seen last visit for a sleep consult to reestablish for sleep apnea.  Patient has a previous diagnosis of mild sleep apnea in the past.  She has worked on weight loss.  She continues to have ongoing symptoms with snoring, daytime sleepiness and teeth grinding.  We went over her sleep study results in detail.  We went over treatment options including weight loss.  Oral appliance and CPAP therapy.  Patient would like to proceed with oral appliance referral.   No Known Allergies  Immunization History  Administered Date(s) Administered   Influenza,inj,Quad PF,6+ Mos 11/01/2012, 10/22/2015, 10/13/2018, 10/14/2021   Influenza-Unspecified 10/14/2014, 11/11/2016, 09/13/2017, 09/29/2019, 10/14/2019   Moderna Covid-19 Vaccine Bivalent Booster 142yr& up 11/06/2020   PFIZER(Purple Top)SARS-COV-2 Vaccination 03/31/2019, 04/25/2019, 12/16/2019   Tdap 06/11/2013   Zoster Recombinat (Shingrix) 08/22/2019, 02/22/2020    Past Medical History:  Diagnosis Date   Allergy    Anemia    IDA   Arthritis    Blood transfusion without reported diagnosis    breast cancer 2016   [place]   Seeing [facility]   CAD (coronary artery disease)    open heart surgery 09/12/10   [place]   [place]   Family hx of colon cancer    GERD (gastroesophageal  reflux disease)    Gilbert disease    Heart disease    History of thyroid cancer    Hypothyroid    Overweight(278.02)    PONV (postoperative nausea and vomiting)     Tobacco History: Social History   Tobacco Use  Smoking Status Never  Smokeless Tobacco Never   Counseling given: Not Answered   Outpatient Medications Prior to Visit  Medication Sig Dispense Refill   Famotidine (PEPCID AC PO) Take 1 tablet by mouth at bedtime.     ferrous sulfate 325 (65 FE) MG tablet Take 1 tablet (325 mg total) by mouth 2 (two) times daily with a meal. 60 tablet 3   levonorgestrel (MIRENA) 20 MCG/24HR IUD 1 each by Intrauterine route once.     levothyroxine (SYNTHROID) 150 MCG tablet TAKE 1 TABLET(150 MCG) BY MOUTH EVERY MORNING 90 tablet 3   losartan (COZAAR) 25 MG tablet TAKE 1 TABLET(25 MG) BY MOUTH DAILY 90 tablet 0   metoprolol succinate (TOPROL-XL) 25 MG 24 hr tablet Take 1 tablet (25 mg total) by mouth daily. 90 tablet 0   omeprazole (PRILOSEC) 40 MG capsule Take 1 capsule (40 mg total) by mouth in the morning and at bedtime. 60 capsule 11   No facility-administered medications prior to visit.     Review of Systems:   Constitutional:   No  weight loss, night sweats,  Fevers, chills, fatigue, or  lassitude.  HEENT:   No headaches,  Difficulty swallowing,  Tooth/dental problems, or  Sore throat,  No sneezing, itching, ear ache, nasal congestion, post nasal drip,   CV:  No chest pain,  Orthopnea, PND, swelling in lower extremities, anasarca, dizziness, palpitations, syncope.   GI  No heartburn, indigestion, abdominal pain, nausea, vomiting, diarrhea, change in bowel habits, loss of appetite, bloody stools.   Resp: No shortness of breath with exertion or at rest.  No excess mucus, no productive cough,  No non-productive cough,  No coughing up of blood.  No change in color of mucus.  No wheezing.  No chest wall deformity  Skin: no rash or lesions.  GU: no dysuria, change  in color of urine, no urgency or frequency.  No flank pain, no hematuria   MS:  No joint pain or swelling.  No decreased range of motion.  No back pain.    Physical Exam  BP 106/82 (BP Location: Left Arm, Patient Position: Sitting, Cuff Size: Large)   Pulse (!) 59   Temp 97.9 F (36.6 C) (Oral)   Ht '5\' 8"'$  (1.727 m)   Wt 209 lb 12.8 oz (95.2 kg)   LMP  (LMP Unknown)   SpO2 98%   BMI 31.90 kg/m   GEN: A/Ox3; pleasant , NAD, well nourished    HEENT:  [place]/AT,   NOSE-clear, THROAT-clear, no lesions, no postnasal drip or exudate noted.  Class II-III MP airway  NECK:  Supple w/ fair ROM; no JVD; normal carotid impulses w/o bruits; no thyromegaly or nodules palpated; no lymphadenopathy.    RESP  Clear  P & A; w/o, wheezes/ rales/ or rhonchi. no accessory muscle use, no dullness to percussion  CARD:  RRR, no m/r/g, no peripheral edema, pulses intact, no cyanosis or clubbing.  GI:   Soft & nt; nml bowel sounds; no organomegaly or masses detected.   Musco: Warm bil, no deformities or joint swelling noted.   Neuro: alert, no focal deficits noted.    Skin: Warm, no lesions or rashes    Lab Results:      BNP No results found for: "BNP"  ProBNP No results found for: "PROBNP"  Imaging: No results found.        No data to display          No results found for: "NITRICOXIDE"      Assessment & Plan:   OSA (obstructive sleep apnea) Mild obstructive sleep apnea.  Patient would like to proceed with referral for oral appliance.  Patient education was given  Plan  Patient Instructions  Referral to Dr. Ron Parker - [phone] for oral appliance evaluation.  Healthy sleep regimen  Do not drive if sleepy  Follow up in 6 months and As needed      Overweight Healthy weight loss discussed    Rexene Edison, NP 12/11/2021

## 2021-12-11 NOTE — Assessment & Plan Note (Signed)
Healthy weight loss discussed 

## 2021-12-11 NOTE — Progress Notes (Signed)
Reviewed and agree with assessment/plan.   Chesley Mires, MD [facility] Pulmonary/Critical Care 12/11/2021, 12:21 PM Pager:  [phone]

## 2021-12-11 NOTE — Patient Instructions (Addendum)
Referral to Dr. Ron Parker - [phone] for oral appliance evaluation.  Healthy sleep regimen  Do not drive if sleepy  Follow up in 6 months and As needed

## 2022-01-09 NOTE — Progress Notes (Signed)
Cardiology Office Note:    Date:  01/10/2022   ID:  [name], DOB 01-12-1971, MRN [id number]  PCP:  Shelda Pal, [place] [facility] Cardiologist:  Sherren Mocha, MD     Referring MD: Shelda Pal*   Chief Complaint  Patient presents with   Coronary Artery Disease    History of Present Illness:    [name] is a 51 y.o. female with a hx of coronary artery dissection, presenting today for follow-up evaluation.  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 She underwent single-vessel CABG. Follow-up gated cardiac CTA demonstrated healing of the native RCA and continued patency of the saphenous vein graft RCA. LV function at follow-up has been normal with an ejection fraction of 55-60%.  With her known history of spontaneous coronary artery dissection, she notified me that a family member had an aortic aneurysm.  A thoracic MRA was performed to evaluate for aneurysmal disease and this was normal. At the time of last year's visit, her BP was above goal and losartan was added to her medication regimen.   The patient is here alone today. Reports that she was diagnosed with iron deficiency anemia this year and is feeling better with iron replacement. She underwent EGD and colonoscopy and reports unremarkable findings. Today, she denies symptoms of palpitations, chest pain, shortness of breath, orthopnea, PND, lower extremity edema, dizziness, or syncope.  The patient is training for a multiday run where she does a 5K, 10K, half marathon, and marathon on 4 consecutive days.  She has been feeling fine with exercise with no exertional symptoms.   Past Medical History:  Diagnosis Date   Allergy    Anemia    IDA   Arthritis    Blood transfusion without reported diagnosis    breast cancer 2016   Breast cancer ([place]) 12/2014    Seeing [facility] doctors   CAD (coronary artery disease)    open heart surgery 09/12/10   Cancer ([place]) 2007   thyroid   Family hx of colon cancer    GERD (gastroesophageal reflux disease)    Gilbert disease    Heart disease    History of thyroid cancer    Hypothyroid    Overweight(278.02)    PONV (postoperative nausea and vomiting)     Past Surgical History:  Procedure Laterality Date   arm surgery     rt   BILATERAL TOTAL MASTECTOMY WITH AXILLARY LYMPH NODE DISSECTION     COLONOSCOPY     coronary artery bypass grafting x1  09/13/2010   [place]   HERNIA REPAIR  1977   inguinal, ?side   KNEE ARTHROSCOPY Right 09/13/2015   pt reported   THYROIDECTOMY     ULNAR NERVE REPAIR Right 1989, 2002   UPPER GASTROINTESTINAL ENDOSCOPY      Current Medications: Current Meds  Medication Sig   Famotidine (PEPCID AC PO) Take 1 tablet by mouth at bedtime.   ferrous sulfate 325 (65 FE) MG tablet Take 1 tablet (325 mg total) by mouth 2 (two) times daily with a meal.   levonorgestrel (MIRENA) 20 MCG/24HR IUD 1 each by Intrauterine route once.   levothyroxine (SYNTHROID) 150 MCG tablet TAKE 1 TABLET(150 MCG) BY MOUTH EVERY MORNING   losartan (COZAAR) 25 MG tablet TAKE 1 TABLET(25 MG) BY MOUTH DAILY   metoprolol succinate (TOPROL-XL) 25 MG 24 hr  tablet Take 1 tablet (25 mg total) by mouth daily.   omeprazole (PRILOSEC) 40 MG capsule Take 1 capsule (40 mg total) by mouth in the morning and at bedtime.     Allergies:   Patient has no known allergies.   Social History   Socioeconomic History   Marital status: Married    Spouse name: Not on file   Number of children: 2   Years of education: 16   Highest education level: Not on file  Occupational History   Occupation: IT    Employer: [employer]  Tobacco Use   Smoking status: Never   Smokeless tobacco: Never  Vaping Use   Vaping Use: Never used  Substance and Sexual Activity   Alcohol use: Yes    Comment: 1 per month    Drug use: No   Sexual activity: Yes    Birth control/protection: I.U.D.  Other Topics Concern   Not on file  Social History Narrative   Regular exercise-yes   Caffeine Use-yes   Works as Patent examiner   [phone]- son   Married   Enjoys Marine scientist and a Scientist, research (physical sciences) degree   Social Determinants of Radio broadcast assistant Strain: Not on Art therapist Insecurity: Not on file  Transportation Needs: Not on file  Physical Activity: Not on file  Stress: Not on file  Social Connections: Not on file     Family History: The patient's family history includes Arthritis in her mother and paternal grandmother; Breast cancer in her maternal aunt and mother; Cancer in her maternal aunt and mother; Colon cancer in her maternal uncle and mother; Coronary artery disease in her maternal grandfather; Diabetes in her maternal aunt, maternal aunt, and paternal grandmother; Heart disease in her maternal aunt; Hiatal hernia in her father; Hypertension in her father, mother, and sister; Lupus in her maternal aunt; Other in her maternal aunt; Ovarian cancer in her mother; Parkinson's disease in her paternal grandmother. There is no history of Esophageal cancer, Rectal cancer, or Stomach cancer.  ROS:   Please see the history of present illness.    All other systems reviewed and are negative.  EKGs/Labs/Other Studies Reviewed:    The following studies were reviewed today: MRA Chest 11/25/2020: IMPRESSION: Vascular:   Normal caliber and patent thoracic aorta without evidence of dissection.   Non-Vascular:   No acute thoracic abnormality.  Stress Echo 06/05/2020:  1. This is a negative stress echocardiogram for ischemia.   2. This is a low risk study.   EKG:  EKG is ordered today.  The ekg ordered today demonstrates NSR with first degree AV block, low voltage QRS, cannot rule out anterior infarct age-undetermined  Recent Labs: 08/27/2021: BUN 18;  Creatinine, Ser 1.07; Potassium 4.1; Sodium 139; TSH 0.19 09/10/2021: ALT 42; Hemoglobin 10.7; Platelets 211.0  Recent Lipid Panel    Component Value Date/Time   CHOL 150 08/27/2021 0757   TRIG 119.0 08/27/2021 0757   HDL 48.40 08/27/2021 0757   CHOLHDL 3 08/27/2021 0757   VLDL 23.8 08/27/2021 0757   LDLCALC 78 08/27/2021 0757     Risk Assessment/Calculations:                Physical Exam:    VS:  BP 132/78   Pulse (!) 55   Ht '5\' 8"'$  (1.727 m)   Wt 205 lb (93 kg)   SpO2 97%   BMI 31.17 kg/m  Wt Readings from Last 3 Encounters:  01/10/22 205 lb (93 kg)  12/11/21 209 lb 12.8 oz (95.2 kg)  11/12/21 204 lb (92.5 kg)     GEN:  Well nourished, well developed in no acute distress HEENT: Normal NECK: No JVD; No carotid bruits LYMPHATICS: No lymphadenopathy CARDIAC: RRR, no murmurs, rubs, gallops RESPIRATORY:  Clear to auscultation without rales, wheezing or rhonchi  ABDOMEN: Soft, non-tender, non-distended MUSCULOSKELETAL:  No edema; No deformity  SKIN: Warm and dry NEUROLOGIC:  Alert and oriented x 3 PSYCHIATRIC:  Normal affect   ASSESSMENT:    1. Coronary artery disease involving native coronary artery of native heart without angina pectoris   2. Essential hypertension   3. Mixed hyperlipidemia    PLAN:    In order of problems listed above:  Patient with remote coronary artery dissection.  Continues to demonstrate medical stability with no exertional symptoms.  She is treated with a beta-blocker and ARB.  Follow-up 1 year. Blood pressure well-controlled after addition of losartan last year.  Continue metoprolol succinate and losartan at current doses.  Most recent labs reviewed with normal creatinine and potassium. Not requiring any therapy.  Cholesterol 150, HDL 48, LDL 78.  No history of atherosclerotic disease.      Medication Adjustments/Labs and Tests Ordered: Current medicines are reviewed at length with the patient today.  Concerns regarding  medicines are outlined above.  Orders Placed This Encounter  Procedures   EKG 12-Lead   No orders of the defined types were placed in this encounter.   Patient Instructions  Medication Instructions:  Your physician recommends that you continue on your current medications as directed. Please refer to the Current Medication list given to you today.  *If you need a refill on your cardiac medications before your next appointment, please call your pharmacy*  Lab Work: If you have labs (blood work) drawn today and your tests are completely normal, you will receive your results only by: [place] (if you have MyChart) OR A paper copy in the mail If you have any lab test that is abnormal or we need to change your treatment, we will call you to review the results.  Testing/Procedures: None ordered today.  Follow-Up: At [facility], you and your health needs are our priority.  As part of our continuing mission to provide you with exceptional heart care, we have created designated Provider Care Teams.  These Care Teams include your primary Cardiologist (physician) and Advanced Practice Providers (APPs -  Physician Assistants and Nurse Practitioners) who all work together to provide you with the care you need, when you need it.  We recommend signing up for the patient portal called "MyChart".  Sign up information is provided on this After Visit Summary.  MyChart is used to connect with patients for Virtual Visits (Telemedicine).  Patients are able to view lab/test results, encounter notes, upcoming appointments, etc.  Non-urgent messages can be sent to your provider as well.   To learn more about what you can do with MyChart, go to [email or web address].    Your next appointment:   12 month(s)  The format for your next appointment:   In Person  Provider:   Sherren Mocha, MD       Important Information About Sugar         Signed, Sherren Mocha, MD  01/10/2022  1:10 PM    [place]

## 2022-01-10 ENCOUNTER — Ambulatory Visit: Payer: BC Managed Care – PPO | Attending: Cardiovascular Disease | Admitting: Cardiovascular Disease

## 2022-01-10 ENCOUNTER — Encounter: Payer: Self-pay | Admitting: Cardiovascular Disease

## 2022-01-10 VITALS — BP 132/78 | HR 55 | Ht 68.0 in | Wt 205.0 lb

## 2022-01-10 DIAGNOSIS — I10 Essential (primary) hypertension: Secondary | ICD-10-CM | POA: Diagnosis not present

## 2022-01-10 DIAGNOSIS — I2510 Atherosclerotic heart disease of native coronary artery without angina pectoris: Secondary | ICD-10-CM | POA: Diagnosis not present

## 2022-01-10 DIAGNOSIS — E782 Mixed hyperlipidemia: Secondary | ICD-10-CM | POA: Diagnosis not present

## 2022-01-10 NOTE — Patient Instructions (Addendum)
Medication Instructions:  Your physician recommends that you continue on your current medications as directed. Please refer to the Current Medication list given to you today.  *If you need a refill on your cardiac medications before your next appointment, please call your pharmacy*  Lab Work: If you have labs (blood work) drawn today and your tests are completely normal, you will receive your results only by: [place] (if you have MyChart) OR A paper copy in the mail If you have any lab test that is abnormal or we need to change your treatment, we will call you to review the results.  Testing/Procedures: None ordered today.  Follow-Up: At [facility], you and your health needs are our priority.  As part of our continuing mission to provide you with exceptional heart care, we have created designated Provider Care Teams.  These Care Teams include your primary Cardiologist (physician) and Advanced Practice Providers (APPs -  Physician Assistants and Nurse Practitioners) who all work together to provide you with the care you need, when you need it.  We recommend signing up for the patient portal called "MyChart".  Sign up information is provided on this After Visit Summary.  MyChart is used to connect with patients for Virtual Visits (Telemedicine).  Patients are able to view lab/test results, encounter notes, upcoming appointments, etc.  Non-urgent messages can be sent to your provider as well.   To learn more about what you can do with MyChart, go to [email or web address].    Your next appointment:   12 month(s)  The format for your next appointment:   In Person  Provider:   Sherren Mocha, MD       Important Information About Sugar

## 2022-01-13 ENCOUNTER — Other Ambulatory Visit: Payer: Self-pay | Admitting: Gastroenterology

## 2022-01-14 ENCOUNTER — Other Ambulatory Visit: Payer: Self-pay | Admitting: Gastroenterology

## 2022-01-26 ENCOUNTER — Encounter: Payer: Self-pay | Admitting: Hematology

## 2022-01-27 ENCOUNTER — Other Ambulatory Visit: Payer: Self-pay | Admitting: *Deleted

## 2022-01-27 DIAGNOSIS — C73 Malignant neoplasm of thyroid gland: Secondary | ICD-10-CM

## 2022-01-28 ENCOUNTER — Encounter: Payer: Self-pay | Admitting: Hematology

## 2022-01-28 ENCOUNTER — Other Ambulatory Visit: Payer: Self-pay

## 2022-01-28 DIAGNOSIS — C73 Malignant neoplasm of thyroid gland: Secondary | ICD-10-CM

## 2022-01-28 DIAGNOSIS — D0511 Intraductal carcinoma in situ of right breast: Secondary | ICD-10-CM

## 2022-02-03 ENCOUNTER — Encounter: Payer: Self-pay | Admitting: Family Medicine

## 2022-02-03 ENCOUNTER — Ambulatory Visit: Payer: BC Managed Care – PPO | Admitting: Family Medicine

## 2022-02-03 VITALS — BP 110/70 | HR 56 | Temp 97.4°F | Ht 68.0 in | Wt 207.1 lb

## 2022-02-03 DIAGNOSIS — L0100 Impetigo, unspecified: Secondary | ICD-10-CM

## 2022-02-03 MED ORDER — CEPHALEXIN 500 MG PO CAPS: 500.0000 mg | ORAL_CAPSULE | Freq: Three times a day (TID) | ORAL | 0 refills | Status: DC

## 2022-02-03 NOTE — Patient Instructions (Signed)
The area of interest has a "honey-crusted" appearance. If that does not improve in the next few days, take the antibiotic.   Let us know if you need anything.

## 2022-02-03 NOTE — Progress Notes (Signed)
Chief Complaint  Patient presents with   belly button redness    [name] is a 53 y.o. female here for a skin complaint.  Duration: 1 week Location: umbilicus Pruritic? No Painful? No Drainage? Yes New soaps/lotions/topicals/detergents? No Other associated symptoms: slight redness Therapies tried thus far: Alcohol  Past Medical History:  Diagnosis Date   Allergy    Anemia    IDA   Arthritis    Blood transfusion without reported diagnosis    breast cancer 2016   Breast cancer ([place]) 12/2014   Seeing [facility] doctors   CAD (coronary artery disease)    open heart surgery 09/12/10   Cancer ([place]) 2007   thyroid   Family hx of colon cancer    GERD (gastroesophageal reflux disease)    Gilbert disease    Heart disease    History of thyroid cancer    Hypothyroid    Overweight(278.02)    PONV (postoperative nausea and vomiting)     BP 110/70 (BP Location: Left Arm, Patient Position: Sitting, Cuff Size: Normal)   Pulse (!) 56   Temp (!) 97.4 F (36.3 C) (Oral)   Ht '5\' 8"'$  (1.727 m)   Wt 207 lb 2 oz (94 kg)   SpO2 97%   BMI 31.49 kg/m  Gen: awake, alert, appearing stated age Lungs: No accessory muscle use Skin: honey crusted lesions around umbilicus. No drainage, erythema, TTP, fluctuance, excoriation Psych: Age appropriate judgment and insight  Impetigo - Plan: cephALEXin (KEFLEX) 500 MG capsule  Might be improving. She has no sensation there. No redness. Will see how she does in the next few days and if no better, will take 7 d course of Keflex.  F/u prn. The patient voiced understanding and agreement to the plan.  [place], DO 02/03/22 3:21 PM

## 2022-02-05 ENCOUNTER — Other Ambulatory Visit: Payer: Self-pay | Admitting: Hematology

## 2022-02-05 ENCOUNTER — Ambulatory Visit (HOSPITAL_COMMUNITY)
Admission: RE | Admit: 2022-02-05 | Discharge: 2022-02-05 | Disposition: A | Discharge status: Home / Self Care | Payer: BC Managed Care – PPO | Source: Ambulatory Visit | Attending: Hematology | Admitting: Hematology

## 2022-02-05 ENCOUNTER — Inpatient Hospital Stay: Payer: BC Managed Care – PPO | Attending: Internal Medicine

## 2022-02-05 DIAGNOSIS — Z800 Family history of malignant neoplasm of digestive organs: Secondary | ICD-10-CM | POA: Insufficient documentation

## 2022-02-05 DIAGNOSIS — K219 Gastro-esophageal reflux disease without esophagitis: Secondary | ICD-10-CM | POA: Insufficient documentation

## 2022-02-05 DIAGNOSIS — C73 Malignant neoplasm of thyroid gland: Secondary | ICD-10-CM

## 2022-02-05 DIAGNOSIS — C50919 Malignant neoplasm of unspecified site of unspecified female breast: Secondary | ICD-10-CM | POA: Diagnosis not present

## 2022-02-05 DIAGNOSIS — Z85850 Personal history of malignant neoplasm of thyroid: Secondary | ICD-10-CM | POA: Insufficient documentation

## 2022-02-05 DIAGNOSIS — E039 Hypothyroidism, unspecified: Secondary | ICD-10-CM | POA: Insufficient documentation

## 2022-02-05 DIAGNOSIS — Z853 Personal history of malignant neoplasm of breast: Secondary | ICD-10-CM | POA: Insufficient documentation

## 2022-02-05 DIAGNOSIS — Z9013 Acquired absence of bilateral breasts and nipples: Secondary | ICD-10-CM | POA: Insufficient documentation

## 2022-02-05 DIAGNOSIS — D0511 Intraductal carcinoma in situ of right breast: Secondary | ICD-10-CM

## 2022-02-05 DIAGNOSIS — Z79899 Other long term (current) drug therapy: Secondary | ICD-10-CM | POA: Insufficient documentation

## 2022-02-05 DIAGNOSIS — I2510 Atherosclerotic heart disease of native coronary artery without angina pectoris: Secondary | ICD-10-CM | POA: Insufficient documentation

## 2022-02-05 DIAGNOSIS — K8020 Calculus of gallbladder without cholecystitis without obstruction: Secondary | ICD-10-CM | POA: Diagnosis not present

## 2022-02-05 LAB — CMP (CANCER CENTER ONLY)
ALT: 43 U/L (ref 0–44)
AST: 27 U/L (ref 15–41)
Albumin: 4.3 g/dL (ref 3.5–5.0)
Alkaline Phosphatase: 132 U/L — ABNORMAL HIGH (ref 38–126)
Anion gap: 5 (ref 5–15)
BUN: 14 mg/dL (ref 6–20)
CO2: 26 mmol/L (ref 22–32)
Calcium: 10.3 mg/dL (ref 8.9–10.3)
Chloride: 107 mmol/L (ref 98–111)
Creatinine: 0.93 mg/dL (ref 0.44–1.00)
GFR, Estimated: >60 mL/min (ref >60)
Glucose, Bld: 85 mg/dL (ref 70–99)
Potassium: 3.9 mmol/L (ref 3.5–5.1)
Sodium: 138 mmol/L (ref 135–145)
Total Bilirubin: 1.9 mg/dL — ABNORMAL HIGH (ref 0.3–1.2)
Total Protein: 7.4 g/dL (ref 6.5–8.1)

## 2022-02-05 LAB — CBC WITH DIFFERENTIAL (CANCER CENTER ONLY)
Abs Immature Granulocytes: 0.01 10*3/uL (ref 0.00–0.07)
Basophils Absolute: 0.1 10*3/uL (ref 0.0–0.1)
Basophils Relative: 1 %
Eosinophils Absolute: 0 10*3/uL (ref 0.0–0.5)
Eosinophils Relative: 0 %
HCT: 40.4 % (ref 36.0–46.0)
Hemoglobin: 14.4 g/dL (ref 12.0–15.0)
Immature Granulocytes: 0 %
Lymphocytes Relative: 23 %
Lymphs Abs: 1.6 10*3/uL (ref 0.7–4.0)
MCH: 30 pg (ref 26.0–34.0)
MCHC: 35.6 g/dL (ref 30.0–36.0)
MCV: 84.2 fL (ref 80.0–100.0)
Monocytes Absolute: 0.4 10*3/uL (ref 0.1–1.0)
Monocytes Relative: 6 %
Neutro Abs: 4.7 10*3/uL (ref 1.7–7.7)
Neutrophils Relative %: 70 %
Platelet Count: 267 10*3/uL (ref 150–400)
RBC: 4.8 MIL/uL (ref 3.87–5.11)
RDW: 12.5 % (ref 11.5–15.5)
WBC Count: 6.8 10*3/uL (ref 4.0–10.5)
nRBC: 0 % (ref 0.0–0.2)

## 2022-02-05 MED ORDER — GADOBUTROL 1 MMOL/ML IV SOLN
9.0000 mL | Freq: Once | INTRAVENOUS | Status: AC | PRN
  Administered 2022-02-05: 9 mL via INTRAVENOUS

## 2022-02-09 NOTE — Assessment & Plan Note (Signed)
-  Diagnosed in November 2016 -Status post bilateral mastectomies with immediate DIEP reconstruction 02/24//2017  -No additional treatment or monitoring needed

## 2022-02-09 NOTE — Assessment & Plan Note (Signed)
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  -will continue annual labs and MRI screening for pancreatic cancer -She will follow-up with dermatologist annually

## 2022-02-10 ENCOUNTER — Other Ambulatory Visit: Payer: BC Managed Care – PPO

## 2022-02-10 ENCOUNTER — Inpatient Hospital Stay: Payer: BC Managed Care – PPO | Admitting: Hematology

## 2022-02-10 ENCOUNTER — Encounter: Payer: Self-pay | Admitting: Hematology

## 2022-02-10 VITALS — BP 116/81 | HR 62 | Temp 97.9°F | Resp 18 | Ht 68.0 in | Wt 205.7 lb

## 2022-02-10 DIAGNOSIS — E039 Hypothyroidism, unspecified: Secondary | ICD-10-CM | POA: Diagnosis not present

## 2022-02-10 DIAGNOSIS — D0511 Intraductal carcinoma in situ of right breast: Secondary | ICD-10-CM

## 2022-02-10 DIAGNOSIS — Z1509 Genetic susceptibility to other malignant neoplasm: Secondary | ICD-10-CM

## 2022-02-10 DIAGNOSIS — Z85850 Personal history of malignant neoplasm of thyroid: Secondary | ICD-10-CM | POA: Diagnosis not present

## 2022-02-10 DIAGNOSIS — K219 Gastro-esophageal reflux disease without esophagitis: Secondary | ICD-10-CM | POA: Diagnosis not present

## 2022-02-10 DIAGNOSIS — Z1501 Genetic susceptibility to malignant neoplasm of breast: Secondary | ICD-10-CM | POA: Diagnosis not present

## 2022-02-10 DIAGNOSIS — Z800 Family history of malignant neoplasm of digestive organs: Secondary | ICD-10-CM | POA: Diagnosis not present

## 2022-02-10 DIAGNOSIS — Z9013 Acquired absence of bilateral breasts and nipples: Secondary | ICD-10-CM | POA: Diagnosis not present

## 2022-02-10 DIAGNOSIS — I2510 Atherosclerotic heart disease of native coronary artery without angina pectoris: Secondary | ICD-10-CM | POA: Diagnosis not present

## 2022-02-10 DIAGNOSIS — Z79899 Other long term (current) drug therapy: Secondary | ICD-10-CM | POA: Diagnosis not present

## 2022-02-10 DIAGNOSIS — Z853 Personal history of malignant neoplasm of breast: Secondary | ICD-10-CM | POA: Diagnosis not present

## 2022-02-10 NOTE — Progress Notes (Signed)
[name]   Telephone:([phone]) [phone] Fax:(336) [phone]   Clinic Follow up Note   Patient Care Team: Shelda Pal, DO as PCP - General (Family Medicine) Sherren Mocha, MD as PCP - Cardiology (Cardiology) Kathyrn Lass, MD (Family Medicine) Rolm Bookbinder, MD as Consulting Physician (General Surgery) Irene Limbo, MD as Consulting Physician (Plastic Surgery) Sherren Mocha, MD as Consulting Physician (Cardiology) [place], Jennefer Bravo, MD as Referring Physician (Dermatology) Jacinto Reap, MD (Plastic Surgery) Jola Schmidt, MD as Consulting Physician (Ophthalmology) Truitt Merle, MD as Attending Physician (Hematology and Oncology)  Date of Service:  02/10/2022  CHIEF COMPLAINT: f/u of Right-sided ductal carcinoma in situ (s/p bilateral mastectomies)   CURRENT THERAPY: Surveillance   ASSESSMENT:  [name] is a 52 y.o. female with Right-sided ductal carcinoma in situ (s/p bilateral mastectomies)   Ductal carcinoma in situ (DCIS) of right breast -Diagnosed in November 2016 -Status post bilateral mastectomies with immediate DIEP reconstruction 02/24//2017  -No additional treatment or monitoring needed   Monoallelic mutation of XFGH8E gene -Patients who carry a CDKN2A mutation are at high risk of melanoma (28-76% lifetime risk) and pancreatic cancer (17% lifetime risk).  -will continue annual labs and MRI screening for pancreatic cancer -She will follow-up with dermatologist annually   PLAN: - reviewed current MRI scan which was negative  - reviewed medical history and family history - reviewed meds -I recommended her to follow-up with her PCP for annual pancreatic cancer screening -I will see her as needed.    SUMMARY OF ONCOLOGIC HISTORY: Oncology History Overview Note  Mutation in CDKN2A   Thyroid cancer ([place]) (Resolved)  06/16/2011 Initial Diagnosis   Malignant neoplasm of thyroid gland ([place])      INTERVAL  HISTORY:  [name] is here for a follow up of Right-sided ductal carcinoma in situ (s/p bilateral mastectomies)  She was last seen by Dr. Jana Hakim in 2022.  She presents to the clinic alone, pt states that she has anemia that has resolved by taking oral iron,  pt has had an endoscopy and colonoscopy that were both clear and needing no close follow up. No other issues since her last visit. Her mother is my patient and is receiving palliative chemotherapy for metastatic colon cancer.  She is quite concerned about her mother, and he told me a lot about her mother's condition at home.  All other systems were reviewed with the patient and are negative.  MEDICAL HISTORY:  Past Medical History:  Diagnosis Date   Allergy    Anemia    IDA   Arthritis    Blood transfusion without reported diagnosis    breast cancer 2016   Breast cancer ([place]) 12/2014   Seeing [facility] doctors   CAD (coronary artery disease)    open heart surgery 09/12/10   Cancer ([place]) 2007   thyroid   Family hx of colon cancer    GERD (gastroesophageal reflux disease)    Gilbert disease    Heart disease    History of thyroid cancer    Hypothyroid    Overweight(278.02)    PONV (postoperative nausea and vomiting)     SURGICAL HISTORY: Past Surgical History:  Procedure Laterality Date   arm surgery     rt   BILATERAL TOTAL MASTECTOMY WITH AXILLARY LYMPH NODE DISSECTION     COLONOSCOPY     coronary artery bypass grafting x1  09/13/2010   [place] [facility]   HERNIA REPAIR  1977   inguinal, ?side   KNEE  ARTHROSCOPY Right 09/13/2015   pt reported   [place] Right 1989, 2002   UPPER GASTROINTESTINAL ENDOSCOPY      I have reviewed the social history and family history with the patient and they are unchanged from previous note.  ALLERGIES:  has No Known Allergies.  MEDICATIONS:  Current Outpatient Medications  Medication Sig Dispense Refill   Famotidine (PEPCID AC PO) Take 1 tablet by  mouth at bedtime.     Ferrous Sulfate (IRON) 325 (65 Fe) MG TABS TAKE 1 TABLET(325 MG) BY MOUTH TWICE DAILY WITH A MEAL 180 tablet 0   levonorgestrel (MIRENA) 20 MCG/24HR IUD 1 each by Intrauterine route once.     levothyroxine (SYNTHROID) 150 MCG tablet TAKE 1 TABLET(150 MCG) BY MOUTH EVERY MORNING 90 tablet 3   losartan (COZAAR) 25 MG tablet TAKE 1 TABLET(25 MG) BY MOUTH DAILY 90 tablet 0   metoprolol succinate (TOPROL-XL) 25 MG 24 hr tablet Take 1 tablet (25 mg total) by mouth daily. 90 tablet 0   omeprazole (PRILOSEC) 40 MG capsule Take 1 capsule (40 mg total) by mouth in the morning and at bedtime. 60 capsule 11   No current facility-administered medications for this visit.    PHYSICAL EXAMINATION: ECOG PERFORMANCE STATUS: 0 - Asymptomatic  Vitals:   02/10/22 1436  BP: 116/81  Pulse: 62  Resp: 18  Temp: 97.9 F (36.6 C)  SpO2: 99%   Wt Readings from Last 3 Encounters:  02/10/22 205 lb 11.2 oz (93.3 kg)  02/03/22 207 lb 2 oz (94 kg)  01/10/22 205 lb (93 kg)    NECK: (-)supple, thyroid normal size, non-tender, without nodularity LYMPH:(-)  no palpable lymphadenopathy in the cervical, axillary  LUNGS: (-)clear to auscultation and percussion with normal breathing effort HEART: (-)regular rate & rhythm and no murmurs and no lower extremity edema ABDOMEN:(-)abdomen soft, non-tender and normal bowel sounds Musculoskeletal:no cyanosis of digits and no clubbing  NEURO: alert & oriented x 3 with fluent speech, no focal motor/sensory deficits  LABORATORY DATA:  I have reviewed the data as listed    Latest Ref Rng & Units 02/05/2022   12:02 PM 09/10/2021    7:31 AM 08/27/2021    7:57 AM  CBC  WBC 4.0 - 10.5 K/uL 6.8  4.9  5.5   Hemoglobin 12.0 - 15.0 g/dL 14.4  10.7  10.8   Hematocrit 36.0 - 46.0 % 40.4  32.4  33.6   Platelets 150 - 400 K/uL 267  211.0  230.0         Latest Ref Rng & Units 02/05/2022   12:02 PM 09/10/2021    7:31 AM 08/27/2021    7:57 AM  CMP  Glucose 70 -  99 mg/dL 85   91   BUN 6 - 20 mg/dL 14   18   Creatinine 0.44 - 1.00 mg/dL 0.93   1.07   Sodium 135 - 145 mmol/L 138   139   Potassium 3.5 - 5.1 mmol/L 3.9   4.1   Chloride 98 - 111 mmol/L 107   107   CO2 22 - 32 mmol/L 26   23   Calcium 8.9 - 10.3 mg/dL 10.3   9.9   Total Protein 6.5 - 8.1 g/dL 7.4  6.9  6.7   Total Bilirubin 0.3 - 1.2 mg/dL 1.9  1.2  1.0   Alkaline Phos 38 - 126 U/L 132  137  154   AST 15 - 41 U/L  27  32  40   ALT 0 - 44 U/L 43  42  41       RADIOGRAPHIC STUDIES: I have personally reviewed the radiological images as listed and agreed with the findings in the report. No results found.    No orders of the defined types were placed in this encounter.  All questions were answered. The patient knows to call the clinic with any problems, questions or concerns. No barriers to learning was detected. The total time spent in the appointment was 30 minutes.     Truitt Merle, MD 02/10/2022   I, Maurine Simmering, CMA, am acting as scribe for Truitt Merle, MD.   I have reviewed the above documentation for accuracy and completeness, and I agree with the above.

## 2022-02-14 ENCOUNTER — Other Ambulatory Visit: Payer: Self-pay | Admitting: Cardiovascular Disease

## 2022-03-05 ENCOUNTER — Encounter: Payer: Self-pay | Admitting: Family Medicine

## 2022-03-05 ENCOUNTER — Ambulatory Visit: Payer: BC Managed Care – PPO | Admitting: Family Medicine

## 2022-03-05 VITALS — BP 120/70 | HR 78 | Temp 97.9°F | Ht 68.0 in | Wt 206.1 lb

## 2022-03-05 DIAGNOSIS — E890 Postprocedural hypothyroidism: Secondary | ICD-10-CM

## 2022-03-05 DIAGNOSIS — Z1509 Genetic susceptibility to other malignant neoplasm: Secondary | ICD-10-CM | POA: Diagnosis not present

## 2022-03-05 DIAGNOSIS — N951 Menopausal and female climacteric states: Secondary | ICD-10-CM

## 2022-03-05 LAB — T4, FREE: Free T4: 0.99 ng/dL (ref 0.60–1.60)

## 2022-03-05 LAB — TSH: TSH: 2.85 u[IU]/mL (ref 0.35–5.50)

## 2022-03-05 MED ORDER — GABAPENTIN 300 MG PO CAPS: 300.0000 mg | ORAL_CAPSULE | Freq: Every day | ORAL | 3 refills | Status: DC

## 2022-03-05 NOTE — Patient Instructions (Addendum)
Please let me know when you are due for your next MRI abdomen and I will order it.   Give [place] 2-3 business days to get the results of your labs back.   Let us know if you need anything.

## 2022-03-05 NOTE — Progress Notes (Signed)
Chief Complaint  Patient presents with   Follow-up    6 month    Subjective: Patient is a 52 y.o. female here for f/u.  Hypothyroidism Patient presents for follow-up of hypothyroidism.  Reports compliance with medication- levothyroxine 150 mcg/d. Current symptoms include: denies fatigue, weight changes, heat/cold intolerance, bowel/skin changes or CVS symptoms She believes her dose should be unchanged  3 years ago, the patient started going through menopause.  She has hot flashes throughout the day but worse at night.  She now sleeps in a different bed from her husband.  It affects her sleep negatively.  She has more fatigue and weight gain since this started.  She has tried black cohosh and thinks it might have been helpful.  She has never tried a prescription medication.  Past Medical History:  Diagnosis Date   Allergy    Anemia    IDA   Arthritis    Blood transfusion without reported diagnosis    breast cancer 2016   Breast cancer ([place]) 12/2014   Seeing [facility] doctors   CAD (coronary artery disease)    open heart surgery 09/12/10   Cancer ([place]) 2007   thyroid   Family hx of colon cancer    GERD (gastroesophageal reflux disease)    Gilbert disease    Heart disease    History of thyroid cancer    Hypothyroid    Overweight(278.02)    PONV (postoperative nausea and vomiting)     Objective: BP 120/70 (BP Location: Left Arm, Patient Position: Sitting, Cuff Size: Normal)   Pulse 78   Temp 97.9 F (36.6 C) (Oral)   Ht 5' 8"$  (1.727 m)   Wt 206 lb 2 oz (93.5 kg)   SpO2 96%   BMI 31.34 kg/m  General: Awake, appears stated age Heart: RRR, no LE edema Lungs: CTAB, no rales, wheezes or rhonchi. No accessory muscle use Psych: Age appropriate judgment and insight, normal affect and mood  Assessment and Plan: Postoperative hypothyroidism - Plan: TSH, T4, free  Hot flashes due to menopause - Plan: gabapentin (NEURONTIN) 300 MG capsule  Genetic predisposition to  cancer  Chronic, stable. Cont levothyroxine 150 mcg/d. Chronic, not controlled.  Start gabapentin 30 mg daily.  Discussed possibly using soy or black cohosh as adjuncts. She is a predisposition to pancreatic cancer and has been getting yearly MRI abdomen with/without contrast through the oncology team.  They told her that she can have this done through her PCP.  I told her I am happy to order this but she will have to remind me in November for the first scan and then I will keep yearly tabs. Follow-up in 6 months for physical or as needed. The patient voiced understanding and agreement to the plan.  [place], DO 03/05/22  7:46 AM

## 2022-04-28 ENCOUNTER — Encounter: Payer: Self-pay | Admitting: *Deleted

## 2022-06-17 DIAGNOSIS — L821 Other seborrheic keratosis: Secondary | ICD-10-CM | POA: Diagnosis not present

## 2022-06-17 DIAGNOSIS — D225 Melanocytic nevi of trunk: Secondary | ICD-10-CM | POA: Diagnosis not present

## 2022-06-17 DIAGNOSIS — L814 Other melanin hyperpigmentation: Secondary | ICD-10-CM | POA: Diagnosis not present

## 2022-06-17 DIAGNOSIS — D2261 Melanocytic nevi of right upper limb, including shoulder: Secondary | ICD-10-CM | POA: Diagnosis not present

## 2022-06-24 ENCOUNTER — Other Ambulatory Visit: Payer: Self-pay | Admitting: Gastroenterology

## 2022-06-25 NOTE — Telephone Encounter (Signed)
Refill request for ferrous sulfate. Last office visit 09-20-2021.Last labs completed 09-10-2021. Please advise on refills

## 2022-07-02 ENCOUNTER — Other Ambulatory Visit: Payer: Self-pay | Admitting: Family Medicine

## 2022-07-02 DIAGNOSIS — N951 Menopausal and female climacteric states: Secondary | ICD-10-CM

## 2022-08-13 ENCOUNTER — Encounter (INDEPENDENT_AMBULATORY_CARE_PROVIDER_SITE_OTHER): Payer: Self-pay

## 2022-08-17 ENCOUNTER — Other Ambulatory Visit: Payer: Self-pay | Admitting: Family Medicine

## 2022-08-17 DIAGNOSIS — E890 Postprocedural hypothyroidism: Secondary | ICD-10-CM

## 2022-09-03 ENCOUNTER — Ambulatory Visit (INDEPENDENT_AMBULATORY_CARE_PROVIDER_SITE_OTHER): Payer: BC Managed Care – PPO | Admitting: Family Medicine

## 2022-09-03 ENCOUNTER — Encounter: Payer: Self-pay | Admitting: Family Medicine

## 2022-09-03 VITALS — BP 118/68 | HR 62 | Temp 98.1°F | Ht 68.0 in | Wt 208.5 lb

## 2022-09-03 DIAGNOSIS — E890 Postprocedural hypothyroidism: Secondary | ICD-10-CM

## 2022-09-03 DIAGNOSIS — Z0000 Encounter for general adult medical examination without abnormal findings: Secondary | ICD-10-CM | POA: Diagnosis not present

## 2022-09-03 LAB — COMPREHENSIVE METABOLIC PANEL
ALT: 48 U/L — ABNORMAL HIGH (ref 0–35)
AST: 25 U/L (ref 0–37)
Albumin: 4.2 g/dL (ref 3.5–5.2)
Alkaline Phosphatase: 149 U/L — ABNORMAL HIGH (ref 39–117)
BUN: 15 mg/dL (ref 6–23)
CO2: 26 meq/L (ref 19–32)
Calcium: 10.1 mg/dL (ref 8.4–10.5)
Chloride: 108 meq/L (ref 96–112)
Creatinine, Ser: 1.03 mg/dL (ref 0.40–1.20)
GFR: 62 mL/min (ref >60.00)
Glucose, Bld: 98 mg/dL (ref 70–99)
Potassium: 4.3 meq/L (ref 3.5–5.1)
Sodium: 141 meq/L (ref 135–145)
Total Bilirubin: 1.3 mg/dL — ABNORMAL HIGH (ref 0.2–1.2)
Total Protein: 6.6 g/dL (ref 6.0–8.3)

## 2022-09-03 LAB — LIPID PANEL
Cholesterol: 125 mg/dL (ref 0–200)
HDL: 37.3 mg/dL — ABNORMAL LOW (ref >39.00)
LDL Cholesterol: 62 mg/dL (ref 0–99)
NonHDL: 87.92
Total CHOL/HDL Ratio: 3
Triglycerides: 129 mg/dL (ref 0.0–149.0)
VLDL: 25.8 mg/dL (ref 0.0–40.0)

## 2022-09-03 LAB — CBC
HCT: 39.9 % (ref 36.0–46.0)
Hemoglobin: 13.4 g/dL (ref 12.0–15.0)
MCHC: 33.5 g/dL (ref 30.0–36.0)
MCV: 86.3 fl (ref 78.0–100.0)
Platelets: 247 10*3/uL (ref 150.0–400.0)
RBC: 4.62 Mil/uL (ref 3.87–5.11)
RDW: 12.9 % (ref 11.5–15.5)
WBC: 6.3 10*3/uL (ref 4.0–10.5)

## 2022-09-03 NOTE — Patient Instructions (Addendum)
Give [place] 2-3 business days to get the results of your labs back.   Keep the diet clean and stay active.  I recommend getting the flu shot in mid October. This suggestion would change if the CDC comes out with a different recommendation.   Please get me a copy of your advanced directive form at your convenience.   Send me a message in a month if no better with the jaw.   Ice/cold pack over area for 10-15 min twice daily.  OK to take Tylenol 1000 mg (2 extra strength tabs) or 975 mg (3 regular strength tabs) every 6 hours as needed.  Heat (pad or rice pillow in microwave) over affected area, 10-15 minutes twice daily.   Let us know if you need anything.

## 2022-09-03 NOTE — Progress Notes (Signed)
Chief Complaint  Patient presents with   Annual Exam     Well Woman [name] is here for a complete physical.   Her last physical was >1 year ago.  Current diet: in general, diet could be better. Current exercise: running. Weight is stable and she confirms fatigue out of ordinary. No LMP recorded. (Menstrual status: IUD).  Seatbelt? Yes Advanced directive? No  Health Maintenance Pap/HPV- Yes Mammogram- N/A- breast cancer hx Colon cancer screening-Yes Shingrix- Yes Tetanus- Yes Hep C screening- Yes HIV screening- Yes  Past Medical History:  Diagnosis Date   Allergy    Anemia    IDA   Arthritis    Blood transfusion without reported diagnosis    breast cancer 2016   Breast cancer ([facility]) 12/2014   Seeing [facility] doctors   CAD (coronary artery disease)    open heart surgery 09/12/10   Cancer ([facility]) 2007   thyroid   Family hx of colon cancer    GERD (gastroesophageal reflux disease)    Gilbert disease    Heart disease    History of thyroid cancer    Hypothyroid    Overweight(278.02)    PONV (postoperative nausea and vomiting)      Past Surgical History:  Procedure Laterality Date   arm surgery     rt   BILATERAL TOTAL MASTECTOMY WITH AXILLARY LYMPH NODE DISSECTION     COLONOSCOPY     coronary artery bypass grafting x1  09/13/2010   [name]   HERNIA REPAIR  1977   inguinal, ?side   KNEE ARTHROSCOPY Right 09/13/2015   pt reported   THYROIDECTOMY     ULNAR NERVE REPAIR Right 1989, 2002   UPPER GASTROINTESTINAL ENDOSCOPY      Medications  Current Outpatient Medications on File Prior to Visit  Medication Sig Dispense Refill   Ferrous Sulfate (IRON) 325 (65 Fe) MG TABS TAKE 1 TABLET(325 MG) BY MOUTH TWICE DAILY WITH A MEAL 180 tablet 0   gabapentin (NEURONTIN) 300 MG capsule TAKE 1 CAPSULE(300 MG) BY MOUTH AT BEDTIME 30 capsule 3   levonorgestrel (MIRENA) 20 MCG/24HR IUD 1 each by Intrauterine route once.     levothyroxine (SYNTHROID) 150 MCG tablet  TAKE 1 TABLET(150 MCG) BY MOUTH EVERY MORNING 90 tablet 3   losartan (COZAAR) 25 MG tablet TAKE 1 TABLET(25 MG) BY MOUTH DAILY 90 tablet 3   metoprolol succinate (TOPROL-XL) 25 MG 24 hr tablet TAKE 1 TABLET(25 MG) BY MOUTH DAILY 90 tablet 3   omeprazole (PRILOSEC) 40 MG capsule Take 1 capsule (40 mg total) by mouth in the morning and at bedtime. 60 capsule 11   No current facility-administered medications on file prior to visit.     Allergies No Known Allergies  Review of Systems: Constitutional:  no unexpected weight changes Eye:  no recent significant change in vision Ear/Nose/Mouth/Throat:  Ears:  no recent change in hearing Nose/Mouth/Throat:  no complaints of nasal congestion, no sore throat Cardiovascular: no chest pain Respiratory:  no shortness of breath Gastrointestinal:  no abdominal pain, no change in bowel habits GU:  Female: negative for dysuria or pelvic pain Musculoskeletal/Extremities: +L sided jaw pain Integumentary (Skin/Breast):  no abnormal skin lesions reported Neurologic:  no headaches Endocrine:  denies fatigue  Exam BP 118/68 (BP Location: Right Arm, Patient Position: Sitting, Cuff Size: Normal)   Pulse 62   Temp 98.1 F (36.7 C) (Oral)   Ht 5\' 8"  (1.727 m)   Wt 208 lb 8 oz (94.6 kg)  SpO2 99%   BMI 31.70 kg/m  General:  well developed, well nourished, in no apparent distress Skin:  no significant moles, warts, or growths Head:  no masses, lesions, or tenderness Eyes:  pupils equal and round, sclera anicteric without injection Ears:  canals without lesions, TMs shiny without retraction, no obvious effusion, no erythema Nose:  nares patent, mucosa normal, and no drainage  Throat/Pharynx:  lips and gingiva without lesion; tongue and uvula midline; non-inflamed pharynx; no exudates or postnasal drainage Neck: neck supple without adenopathy, thyromegaly, or masses Lungs:  clear to auscultation, breath sounds equal bilaterally, no respiratory  distress Cardio:  regular rate and rhythm, no LE edema Abdomen:  abdomen soft, nontender; bowel sounds normal; no masses or organomegaly Genital: Defer to GYN Musculoskeletal: +ttp over L TMJ; symmetrical muscle groups noted without atrophy or deformity Extremities:  no clubbing, cyanosis, or edema, no deformities, no skin discoloration Neuro:  gait normal; deep tendon reflexes normal and symmetric Psych: well oriented with normal range of affect and appropriate judgment/insight  Assessment and Plan  Well adult exam - Plan: CBC, Comprehensive metabolic panel, Lipid panel  Postoperative hypothyroidism - Plan: TSH, T4, free   Well 52 y.o. female. Counseled on diet and exercise. Advanced directive form provided today.  Other orders as above. Jaw exercises for TMJ provided. She sees dentist soon. PT if no better in next mo.  Follow up in 6 mo or prn. The patient voiced understanding and agreement to the plan.  Jilda Roche [place], DO 09/03/22 7:23 AM

## 2022-09-05 LAB — T4, FREE: Free T4: 1.38 ng/dL (ref 0.60–1.60)

## 2022-09-05 LAB — TSH: TSH: 0.48 u[IU]/mL (ref 0.35–5.50)

## 2022-09-21 ENCOUNTER — Other Ambulatory Visit: Payer: Self-pay | Admitting: Gastroenterology

## 2022-09-21 DIAGNOSIS — K219 Gastro-esophageal reflux disease without esophagitis: Secondary | ICD-10-CM

## 2022-09-22 ENCOUNTER — Other Ambulatory Visit: Payer: Self-pay | Admitting: Gastroenterology

## 2022-09-22 DIAGNOSIS — K219 Gastro-esophageal reflux disease without esophagitis: Secondary | ICD-10-CM

## 2022-11-02 ENCOUNTER — Other Ambulatory Visit: Payer: Self-pay | Admitting: Family Medicine

## 2022-11-02 DIAGNOSIS — N951 Menopausal and female climacteric states: Secondary | ICD-10-CM

## 2022-11-24 ENCOUNTER — Encounter: Payer: Self-pay | Admitting: Family Medicine

## 2022-11-25 ENCOUNTER — Other Ambulatory Visit: Payer: Self-pay | Admitting: Family Medicine

## 2022-11-25 DIAGNOSIS — Z85850 Personal history of malignant neoplasm of thyroid: Secondary | ICD-10-CM

## 2022-11-25 DIAGNOSIS — Z853 Personal history of malignant neoplasm of breast: Secondary | ICD-10-CM

## 2022-12-01 ENCOUNTER — Ambulatory Visit: Payer: BC Managed Care – PPO

## 2022-12-01 DIAGNOSIS — Z85850 Personal history of malignant neoplasm of thyroid: Secondary | ICD-10-CM

## 2022-12-01 DIAGNOSIS — Z853 Personal history of malignant neoplasm of breast: Secondary | ICD-10-CM

## 2022-12-01 DIAGNOSIS — C259 Malignant neoplasm of pancreas, unspecified: Secondary | ICD-10-CM | POA: Diagnosis not present

## 2022-12-01 DIAGNOSIS — R161 Splenomegaly, not elsewhere classified: Secondary | ICD-10-CM | POA: Diagnosis not present

## 2022-12-01 DIAGNOSIS — K8020 Calculus of gallbladder without cholecystitis without obstruction: Secondary | ICD-10-CM | POA: Diagnosis not present

## 2022-12-01 MED ORDER — GADOBUTROL 1 MMOL/ML IV SOLN
9.5000 mL | Freq: Once | INTRAVENOUS | Status: AC | PRN
  Administered 2022-12-01: 9.5 mL via INTRAVENOUS

## 2022-12-04 ENCOUNTER — Other Ambulatory Visit (HOSPITAL_COMMUNITY)
Admission: RE | Admit: 2022-12-04 | Discharge: 2022-12-04 | Disposition: A | Discharge status: Home / Self Care | Payer: BC Managed Care – PPO | Source: Ambulatory Visit | Attending: Family Medicine | Admitting: Family Medicine

## 2022-12-04 ENCOUNTER — Ambulatory Visit: Payer: BC Managed Care – PPO | Admitting: Family Medicine

## 2022-12-04 ENCOUNTER — Encounter: Payer: Self-pay | Admitting: Family Medicine

## 2022-12-04 VITALS — BP 120/75 | HR 72 | Ht 68.0 in | Wt 205.0 lb

## 2022-12-04 DIAGNOSIS — Z1339 Encounter for screening examination for other mental health and behavioral disorders: Secondary | ICD-10-CM

## 2022-12-04 DIAGNOSIS — Z975 Presence of (intrauterine) contraceptive device: Secondary | ICD-10-CM | POA: Diagnosis not present

## 2022-12-04 DIAGNOSIS — D0511 Intraductal carcinoma in situ of right breast: Secondary | ICD-10-CM | POA: Diagnosis not present

## 2022-12-04 DIAGNOSIS — Z01419 Encounter for gynecological examination (general) (routine) without abnormal findings: Secondary | ICD-10-CM

## 2022-12-04 DIAGNOSIS — N951 Menopausal and female climacteric states: Secondary | ICD-10-CM | POA: Diagnosis not present

## 2022-12-04 DIAGNOSIS — Z85850 Personal history of malignant neoplasm of thyroid: Secondary | ICD-10-CM

## 2022-12-04 MED ORDER — GABAPENTIN 300 MG PO CAPS
600.0000 mg | ORAL_CAPSULE | Freq: Every day | ORAL | 3 refills | Status: DC
Start: 2022-12-04 — End: 2023-05-12

## 2022-12-04 NOTE — Progress Notes (Signed)
ANNUAL EXAM Patient name: [name] MRN [id number]  Date of birth: 1970/04/12 Chief Complaint:   Annual Exam  History of Present Illness:   [name] is a 52 y.o.  No obstetric history on file.  female  being seen today for a routine annual exam.  Current complaints: Patient has been experiencing night sweats fairly frequently.  These happen more than half the days of the week.  She usually is able to sleep 4 to 5 hours, then wakes up to use the bathroom and has difficulty falling back asleep because of the night sweats.  She has experienced some increased weight gain  No LMP recorded. (Menstrual status: IUD).   [place] - 12/04/22 1356       Pregnancy Intention Screening   Does the patient want to become pregnant in the next year? No    Does the patient's partner want to become pregnant in the next year? No    Would the patient like to discuss contraceptive options today? No      Contraception Wrap Up   Current Method IUD or IUS             Last pap 09/2020. Results were: NILM w/ HRHPV negative. H/O abnormal pap: no Last mammogram: h/o double mastectomy due to breast cancer Last colonoscopy: 2023. Results were: normal. Family h/o colorectal cancer: yes mother     12/04/2022    1:54 PM 09/03/2022    7:00 AM 08/27/2021    7:29 AM 02/07/2021    1:53 PM 08/22/2020    7:05 AM  Depression screen PHQ 2/9  Decreased Interest 0 0 0 0 0  Down, Depressed, Hopeless 0 0 0 0 0  PHQ - 2 Score 0 0 0 0 0  Altered sleeping 0 0 0    Tired, decreased energy 0 0 0    Change in appetite 0 0 0    Feeling bad or failure about yourself  0 0 0    Trouble concentrating 0 0 0    Moving slowly or fidgety/restless 0 0 0    Suicidal thoughts 0 0 0    PHQ-9 Score 0 0 0    Difficult doing work/chores  Not difficult at all Not difficult at all          12/04/2022    1:54 PM  GAD 7 : Generalized Anxiety Score  Nervous, Anxious, on Edge 0  Control/stop worrying 0  Worry too  much - different things 0  Trouble relaxing 0  Restless 0  Easily annoyed or irritable 0  Afraid - awful might happen 0  Total GAD 7 Score 0     Review of Systems:   Pertinent items are noted in HPI Denies any headaches, blurred vision, fatigue, shortness of breath, chest pain, abdominal pain, abnormal vaginal discharge/itching/odor/irritation, problems with periods, bowel movements, urination, or intercourse unless otherwise stated above. Pertinent History Reviewed:  Reviewed past medical,surgical, social and family history.  Reviewed problem list, medications and allergies. Physical Assessment:   Vitals:   12/04/22 1354  BP: 120/75  Pulse: 72  Weight: 205 lb (93 kg)  Height: 5\' 8"  (1.727 m)  Body mass index is 31.17 kg/m.        Physical Examination:   General appearance - well appearing, and in no distress  Mental status - alert, oriented to person, place, and time  Psych:  She has a normal mood and affect  Skin - warm and dry, normal color,  no suspicious lesions noted  Chest - effort normal, all lung fields clear to auscultation bilaterally  Heart - normal rate and regular rhythm  Neck:  midline trachea, no thyromegaly or nodules  Breasts - breasts appear normal, no suspicious masses, no skin or nipple changes or axillary nodes  Abdomen - soft, nontender, nondistended, no masses or organomegaly  Pelvic - VULVA: normal appearing vulva with no masses, tenderness or lesions  VAGINA: normal appearing vagina with normal color and discharge, no lesions  CERVIX: normal appearing cervix without discharge or lesions, no CMT  Thin prep pap is done with HR HPV cotesting  IUD strings seen  UTERUS: uterus is felt to be normal size, shape, consistency and nontender   ADNEXA: No adnexal masses or tenderness noted.  Extremities:  No swelling or varicosities noted  Chaperone present for exam  Assessment & Plan:  1. Well woman exam with routine gynecological exam - Cytology -  PAP  2. Hot flashes due to menopause Will increase gabapentin to 600mg  at bedtime. If not improving with this, can switch to venlafaxine - gabapentin (NEURONTIN) 300 MG capsule; Take 2 capsules (600 mg total) by mouth at bedtime.  Dispense: 60 capsule; Refill: 3  3. Ductal carcinoma in situ (DCIS) of right breast S/p mastectomy and reconstruction  4. IUD (intrauterine device) in place  5. History of thyroid cancer S/p thyroidectomy    Labs/procedures today:   No orders of the defined types were placed in this encounter.   Meds:  Meds ordered this encounter  Medications   gabapentin (NEURONTIN) 300 MG capsule    Sig: Take 2 capsules (600 mg total) by mouth at bedtime.    Dispense:  60 capsule    Refill:  3    Follow-up: No follow-ups on file.  Levie Heritage, DO 12/04/2022 5:01 PM

## 2022-12-09 DIAGNOSIS — L821 Other seborrheic keratosis: Secondary | ICD-10-CM | POA: Diagnosis not present

## 2022-12-09 DIAGNOSIS — L570 Actinic keratosis: Secondary | ICD-10-CM | POA: Diagnosis not present

## 2022-12-09 DIAGNOSIS — D225 Melanocytic nevi of trunk: Secondary | ICD-10-CM | POA: Diagnosis not present

## 2022-12-09 DIAGNOSIS — D2261 Melanocytic nevi of right upper limb, including shoulder: Secondary | ICD-10-CM | POA: Diagnosis not present

## 2022-12-09 DIAGNOSIS — L814 Other melanin hyperpigmentation: Secondary | ICD-10-CM | POA: Diagnosis not present

## 2022-12-10 LAB — CYTOLOGY - PAP
Comment: NEGATIVE
Diagnosis: UNDETERMINED — AB
High risk HPV: NEGATIVE

## 2023-02-05 ENCOUNTER — Encounter: Payer: Self-pay | Admitting: Cardiovascular Disease

## 2023-02-05 ENCOUNTER — Ambulatory Visit: Payer: BC Managed Care – PPO | Attending: Cardiovascular Disease | Admitting: Cardiovascular Disease

## 2023-02-05 VITALS — BP 128/82 | HR 61 | Ht 68.0 in | Wt 213.2 lb

## 2023-02-05 DIAGNOSIS — E782 Mixed hyperlipidemia: Secondary | ICD-10-CM | POA: Diagnosis not present

## 2023-02-05 DIAGNOSIS — I10 Essential (primary) hypertension: Secondary | ICD-10-CM

## 2023-02-05 DIAGNOSIS — I2510 Atherosclerotic heart disease of native coronary artery without angina pectoris: Secondary | ICD-10-CM | POA: Diagnosis not present

## 2023-02-05 NOTE — Assessment & Plan Note (Signed)
Patient with no recurrence since her initial coronary artery dissection treated with single-vessel CABG.  Graft patency has been demonstrated on follow-up imaging.  She has been screened for thoracic aortic aneurysm with a chest MRA.

## 2023-02-05 NOTE — Patient Instructions (Signed)
Follow-Up: At [facility], you and your health needs are our priority.  As part of our continuing mission to provide you with exceptional heart care, we have created designated Provider Care Teams.  These Care Teams include your primary Cardiologist (physician) and Advanced Practice Providers (APPs -  Physician Assistants and Nurse Practitioners) who all work together to provide you with the care you need, when you need it.  Your next appointment:   1 year(s)  Provider:   Tonny Bollman, MD     Other Instructions   1st Floor: - Lobby - Registration  - Pharmacy  - Lab - Cafe  2nd Floor: - PV Lab - Diagnostic Testing (echo, CT, nuclear med)  3rd Floor: - Vacant  4th Floor: - TCTS (cardiothoracic surgery) - AFib Clinic - Structural Heart Clinic - Vascular Surgery  - Vascular Ultrasound  5th Floor: - [facility] Cardiology (general and EP) - Clinical Pharmacy for coumadin, hypertension, lipid, weight-loss medications, and med management appointments    Valet parking services will be available as well.

## 2023-02-05 NOTE — Progress Notes (Signed)
Cardiology Office Note:    Date:  02/05/2023   ID:  [name], DOB [birth year], MRN [id number]  PCP:  Sharlene Dory, DO   [facility] Providers Cardiologist:  Tonny Bollman, MD     Referring MD: Sharlene Dory*   Chief Complaint  Patient presents with   Coronary Artery Disease    History of Present Illness:    [name] is a 53 y.o. female with a hx of coronary artery dissection, presenting today for follow-up evaluation.  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 She underwent single-vessel CABG. Follow-up gated cardiac CTA demonstrated healing of the native RCA and continued patency of the saphenous vein graft RCA. LV function at follow-up has been normal with an ejection fraction of 55-60%.    The patient is here alone today.  She has been under a lot of stress with her work at [employer].  She otherwise is doing fine.  She denies chest pain, chest pressure, or shortness of breath.  Notes that she has gained some weight over the holidays and she has not been as active with her regular exercise.  She went to the gym once last week and she is having no exertional symptoms.  She is compliant with her medications.  Current Medications: Current Meds  Medication Sig   Ferrous Sulfate (IRON) 325 (65 Fe) MG TABS TAKE 1 TABLET(325 MG) BY MOUTH TWICE DAILY WITH A MEAL   gabapentin (NEURONTIN) 300 MG capsule Take 2 capsules (600 mg total) by mouth at bedtime.   levonorgestrel (MIRENA) 20 MCG/24HR IUD 1 each by Intrauterine route once.   levothyroxine (SYNTHROID) 150 MCG tablet TAKE 1 TABLET(150 MCG) BY MOUTH EVERY MORNING   losartan (COZAAR) 25 MG tablet TAKE 1 TABLET(25 MG) BY MOUTH DAILY   metoprolol succinate (TOPROL-XL) 25 MG 24 hr tablet TAKE 1 TABLET(25 MG) BY MOUTH DAILY   omeprazole (PRILOSEC) 40 MG capsule TAKE 1  CAPSULE(40 MG) BY MOUTH IN THE MORNING AND AT BEDTIME     Allergies:   Patient has no known allergies.   ROS:   Please see the history of present illness.    All other systems reviewed and are negative.  EKGs/Labs/Other Studies Reviewed:    The following studies were reviewed today: Cardiac Studies & Procedures     STRESS TESTS  ECHOCARDIOGRAM STRESS TEST 06/05/2020  Narrative EXERCISE STRESS ECHO REPORT   --------------------------------------------------------------------------------  Patient Name:   [name] Date of Exam: 06/05/2020 Medical Rec #:  [id number]          Height:       68.0 in Accession #:    [id number]         Weight:       205.2 lb Date of Birth:  10-07-1970         BSA:          2.066 m Patient Age:    49 years           BP:           155/106 mmHg Patient Gender: F                  HR:           76 bpm. Exam Location:  [facility]  Procedure: Limited Echo, Limited Color Doppler, Cardiac Doppler and Stress Echo  Indications:  I25.10 CAD  History:        Patient has prior history of Echocardiogram examinations, most recent 10/29/2012.  Sonographer:    Samule Ohm RDCS Referring Phys: [phone] Aleesa Sweigert   Sonographer Comments: [facility] DPR SIGNED ON 01/29/19. OK TO SPEAK TO SPOUSE BRYAN Hagan, DAUGHTER LAUREN Flinn, AND MOTHER ELEANOR MCCOY IMPRESSIONS   1. This is a negative stress echocardiogram for ischemia. 2. This is a low risk study.  FINDINGS  Exam Protocol: The patient exercised on a treadmill according to a Bruce protocol. Definity contrast agent was given IV to delineate the left ventricular endocardial borders.   Patient Performance: The patient exercised for 8 minutes achieving 10 METS. The maximum stage achieved was III of the Bruce protocol. The heart rate at peak stress was 176 bpm. The target heart rate was calculated to be 143 bpm. The percentage of maximum predicted heart rate achieved was 104.4 %. The  baseline blood pressure was 155/106 mmHg. The blood pressure at peak stress was 190/88 mmHg. The patient developed shortness of breath during the stress exam. The symptoms resolved with rest.  EKG: Resting EKG showed normal sinus rhythm. The patient developed no abnormal EKG findings and Motion artifact during exercise.   2D Echo Findings: The baseline ejection fraction was 60%. The peak ejection fraction at stress was 80%. Baseline regional wall motion abnormalities were not present. There were no stress-induced wall motion abnormalities. This is a negative stress echocardiogram for ischemia.  Stress Doppler:  Mitral Regurgitation: At baseline, trivial mitral regurgitation was present.   Dietrich Pates MD Electronically signed on 06/15/2020 at 5:40:36 PM      Final     CT SCANS  CT CORONARY MORPH W/CTA COR W/SCORE 07/07/2011  Addendum 07/08/2011  8:37 AM **ADDENDUM** CREATED: 07/08/2011 08:28:01  OVER-READ INTERPRETATION - CT CHEST  The following report is an over-read performed by radiologist Dr. Karn Cassis. Reche Dixon, M.D. of [facility], [place] on 07/08/2011 08:28:01.  This over-read does not include interpretation of cardiac or coronary anatomy or pathology.  The CTA interpretation by the cardiologist is attached.  Comparison:  Plain film chest of 10/09/2010.  No prior chest CTs.  Findings: Lung windows demonstrate no nodules or airspace opacities.  Soft tissue windows demonstrate normal imaged thoracic aorta without dissection. No pericardial or pleural effusion.  Prior median sternotomy No mediastinal or hilar adenopathy.  Limited evaluation for pulmonary embolism, secondary bolus timing.  Limited abdominal imaging demonstrates no significant findings.  No acute osseous abnormality.  IMPRESSION: No extracardiac findings within the imaged chest.  **END ADDENDUM** SIGNED BY: Karn Cassis. Reche Dixon, M.D.  Addendum 07/07/2011  3:01 PM **ADDENDUM** CREATED: 07/07/2011  14:59:29  [facility] to read noncardiac findings.  **END ADDENDUM** SIGNED BY: Laurey Morale  Narrative CARDIAC CTA WITH CALCIUM SCORE 07/07/2011 13:17:00  Ordering Physician: Lysbeth Galas  Reading Physician: DaltonS.Mclean  Protocol:  The patient scanned on a Philips 256 slice scanner. Prospective gating with 5% phase tolerance at 120 kV was used.   A total of 15 mg of IV Lopressor was administered.  Average heart rate during the scan was 65 beats per minute.  After an initial AP and lateral topogram, 3 mm axial slices were performed through the heart for calcium scoring.  The patient then had a 80 ml bolus of contrast given for coronary CTA with bolus tracking in the descending thoracic aorta.  The 3-D data set was then sent to the [employer].  Reconstructions were done using MIP,MPR and VRT modes.  Indications: Chest pain, h/o spontaneous RCA dissection  DETAILED FINDINGS:  Quality of Study: Good  Left Main: No plaque or stenosis  Left Anterior Descending: No plaque or stenosis  Left Circumflex: Large OM1, small AV LCX beyond OM1.  No plaque or stenosis.  Right Coronary Artery: Dominant vessel.  No plaque or stenosis. There did not appear to be any significant perivascular thickening suggestive of hematoma (prior proximal to mid RCA dissection). There was a patent saphenous vein graft to the distal RCA with no significant disease.  Coronary Calcium Score: 0 Agatston units  IMPRESSION: 1. No plaque or stenosis in the coronaries with coronary artery calcium score = 0 Agatston units.  From the stand point of traditional risk factors, this conveys low risk of future cardiac events.  2. Prior RCA dissection with SVG-distal RCA.  The bypass graft is patent without significant disease.  I do not see significant perivascular/intramural thickening around the RCA and it appears widely patent.  Original Report Authenticated By: Consuello Bossier,  M.D.          EKG:   EKG Interpretation Date/Time:  Thursday February 05 2023 08:24:08 EST Ventricular Rate:  59 PR Interval:  232 QRS Duration:  88 QT Interval:  408 QTC Calculation: 403 R Axis:   18  Text Interpretation: Sinus bradycardia with 1st degree A-V block Inferior infarct , age undetermined Cannot rule out Anterior infarct , age undetermined When compared with ECG of 15-Sep-2010 06:08, Minimal criteria for Anterior infarct are now Present T wave inversion no longer evident in Inferior leads QT has shortened Confirmed by Tonny Bollman [phone]) on 02/05/2023 8:46:14 AM    Recent Labs: 09/03/2022: ALT 48; BUN 15; Creatinine, Ser 1.03; Hemoglobin 13.4; Platelets 247.0; Potassium 4.3; Sodium 141; TSH 0.48  Recent Lipid Panel    Component Value Date/Time   CHOL 125 09/03/2022 0730   TRIG 129.0 09/03/2022 0730   HDL 37.30 (L) 09/03/2022 0730   CHOLHDL 3 09/03/2022 0730   VLDL 25.8 09/03/2022 0730   LDLCALC 62 09/03/2022 0730     Risk Assessment/Calculations:                Physical Exam:    VS:  BP 128/82   Pulse 61   Ht 5\' 8"  (1.727 m)   Wt 213 lb 3.2 oz (96.7 kg)   SpO2 96%   BMI 32.42 kg/m     Wt Readings from Last 3 Encounters:  02/05/23 213 lb 3.2 oz (96.7 kg)  12/04/22 205 lb (93 kg)  09/03/22 208 lb 8 oz (94.6 kg)     GEN:  Well nourished, well developed in no acute distress HEENT: Normal NECK: No JVD; No carotid bruits LYMPHATICS: No lymphadenopathy CARDIAC: RRR, no murmurs, rubs, gallops RESPIRATORY:  Clear to auscultation without rales, wheezing or rhonchi  ABDOMEN: Soft, non-tender, non-distended MUSCULOSKELETAL:  No edema; No deformity  SKIN: Warm and dry NEUROLOGIC:  Alert and oriented x 3 PSYCHIATRIC:  Normal affect   Assessment & Plan Essential hypertension Blood pressure controlled on losartan and metoprolol succinate.  Patient will work on lifestyle modification with weight loss.  Continue current medical therapy.  Reviewed labs  from August 2024 with a creatinine of 1.03 and a potassium of 4.3. Coronary artery disease involving native coronary artery of native heart without angina pectoris Patient with no recurrence since her initial coronary artery dissection treated with single-vessel CABG.  Graft patency has been demonstrated on follow-up imaging.  She has been screened for thoracic  aortic aneurysm with a chest MRA. Mixed hyperlipidemia Lipids are excellent on no therapy with a cholesterol 125, LDL 62.     Medication Adjustments/Labs and Tests Ordered: Current medicines are reviewed at length with the patient today.  Concerns regarding medicines are outlined above.  Orders Placed This Encounter  Procedures   EKG 12-Lead   No orders of the defined types were placed in this encounter.   Patient Instructions  Follow-Up: At [facility], you and your health needs are our priority.  As part of our continuing mission to provide you with exceptional heart care, we have created designated Provider Care Teams.  These Care Teams include your primary Cardiologist (physician) and Advanced Practice Providers (APPs -  Physician Assistants and Nurse Practitioners) who all work together to provide you with the care you need, when you need it.  Your next appointment:   1 year(s)  Provider:   Tonny Bollman, MD     Other Instructions   1st Floor: - Lobby - Registration  - Pharmacy  - Lab - Cafe  2nd Floor: - PV Lab - Diagnostic Testing (echo, CT, nuclear med)  3rd Floor: - Vacant  4th Floor: - TCTS (cardiothoracic surgery) - AFib Clinic - Structural Heart Clinic - Vascular Surgery  - Vascular Ultrasound  5th Floor: - HeartCare Cardiology (general and EP) - Clinical Pharmacy for coumadin, hypertension, lipid, weight-loss medications, and med management appointments    Valet parking services will be available as well.          Signed, Tonny Bollman, MD  02/05/2023 1:07 PM    [facility]

## 2023-02-11 ENCOUNTER — Other Ambulatory Visit: Payer: Self-pay | Admitting: Cardiovascular Disease

## 2023-02-11 ENCOUNTER — Encounter: Payer: Self-pay | Admitting: Family Medicine

## 2023-02-11 ENCOUNTER — Other Ambulatory Visit: Payer: Self-pay

## 2023-02-11 DIAGNOSIS — K219 Gastro-esophageal reflux disease without esophagitis: Secondary | ICD-10-CM

## 2023-02-11 MED ORDER — OMEPRAZOLE 40 MG PO CPDR: 40.0000 mg | DELAYED_RELEASE_CAPSULE | Freq: Two times a day (BID) | ORAL | 1 refills | Status: DC

## 2023-04-06 ENCOUNTER — Telehealth: Payer: Self-pay

## 2023-04-06 NOTE — Telephone Encounter (Signed)
 Pt has an appt tomorrow   Copied from CRM [id number]. Topic: Clinical - Request for Lab/Test Order >> Apr 06, 2023 11:15 AM Deaijah H wrote: Reason for CRM: Patient  needs orders for blood work. Stated she gets it done every 6 months.

## 2023-04-07 ENCOUNTER — Ambulatory Visit (INDEPENDENT_AMBULATORY_CARE_PROVIDER_SITE_OTHER)

## 2023-04-07 ENCOUNTER — Encounter: Payer: Self-pay | Admitting: Family Medicine

## 2023-04-07 ENCOUNTER — Ambulatory Visit: Admitting: Family Medicine

## 2023-04-07 ENCOUNTER — Other Ambulatory Visit: Payer: Self-pay | Admitting: Family Medicine

## 2023-04-07 VITALS — BP 118/70 | HR 65 | Temp 97.8°F | Ht 68.0 in | Wt 214.6 lb

## 2023-04-07 DIAGNOSIS — G4700 Insomnia, unspecified: Secondary | ICD-10-CM | POA: Diagnosis not present

## 2023-04-07 DIAGNOSIS — S92516A Nondisplaced fracture of proximal phalanx of unspecified lesser toe(s), initial encounter for closed fracture: Secondary | ICD-10-CM

## 2023-04-07 DIAGNOSIS — M25511 Pain in right shoulder: Secondary | ICD-10-CM

## 2023-04-07 DIAGNOSIS — S92512A Displaced fracture of proximal phalanx of left lesser toe(s), initial encounter for closed fracture: Secondary | ICD-10-CM | POA: Diagnosis not present

## 2023-04-07 DIAGNOSIS — M79675 Pain in left toe(s): Secondary | ICD-10-CM

## 2023-04-07 DIAGNOSIS — G8929 Other chronic pain: Secondary | ICD-10-CM

## 2023-04-07 DIAGNOSIS — S92515A Nondisplaced fracture of proximal phalanx of left lesser toe(s), initial encounter for closed fracture: Secondary | ICD-10-CM | POA: Diagnosis not present

## 2023-04-07 NOTE — Progress Notes (Signed)
 Musculoskeletal Exam  Patient: [name] DOB: 06-15-1970  DOS: 04/07/2023  SUBJECTIVE:  Chief Complaint:   Chief Complaint  Patient presents with   Acute Visit    Patient presents today for left toes check and right shoulder check.    [name] is a 53 y.o.  female for evaluation and treatment of L 2nd toe and R shoulder pain.   Onset:  1 day ago. No inj or change in activity.  Location:  Character:  sharp  Progression of issue:  is unchanged Associated symptoms: bruising and swelling Treatment: to date has been OTC NSAIDS and elevation.   Neurovascular symptoms: no  R shoulder 3 months of pain, plateaued. Starting to wake her up at night. Lateral shoulder pain. No bruising, redness, swelling. Some limited ROM. Tried NSAIDs and some activity modification.   Patient has a chronic history of insomnia.  Lately her shoulders been waking her up but prior to that, she will wake up feeling fatigued.  She will sometimes wake up in the melanite and unable to fall back asleep.  No routine alcohol use.  She will sometimes drink tea in the evenings.  Not much screen time prior to that.  Stress levels are currently controlled.  Melatonin was not helpful.  She had a sleep study without significant sleep apnea.  Past Medical History:  Diagnosis Date   Allergy    Anemia    IDA   Arthritis    Blood transfusion without reported diagnosis    breast cancer 2016   Breast cancer (HCC) 12/2014   Seeing [facility] doctors   CAD (coronary artery disease)    open heart surgery 09/12/10   Cancer (HCC) 2007   thyroid   Family hx of colon cancer    GERD (gastroesophageal reflux disease)    Gilbert disease    Heart disease    History of thyroid cancer    Hypothyroid    Overweight(278.02)    PONV (postoperative nausea and vomiting)     Objective: VITAL SIGNS: BP 118/70   Pulse 65   Temp 97.8 F (36.6 C)   Ht 5\' 8"  (1.727 m)   Wt 214 lb 9.6 oz (97.3 kg)   SpO2 98%   BMI  32.63 kg/m  Constitutional: Well formed, well developed. No acute distress. Thorax & Lungs: No accessory muscle use Musculoskeletal: Right shoulder.   Normal active range of motion: No, slightly decreased forward flexion.   Normal passive range of motion: No, slightly decreased forward flexion.   Tenderness to palpation: Yes over the posterior shoulder Deformity: no Ecchymosis: no Tests positive: None Tests negative: Crossover, Hawkins, empty can, speeds, liftoff There was pain and weakness upon external rotation testing Second toe on the left: There is soft tissue swelling and ecchymosis noted over the area.  There is significant TTP over the proximal phalanx. Neurologic: Normal sensory function. No focal deficits noted.  Psychiatric: Normal mood. Age appropriate judgment and insight. Alert & oriented x 3.    Assessment:  Chronic right shoulder pain  Pain of toe of left foot - Plan: Post-op shoe, DG Toe 2nd Left  Insomnia, unspecified type  Plan: Stretches/exercises, heat, ice, Tylenol.  Will consider physical therapy in 1 month if no improvement. Acute issue.  Flat shoe provided today.  Check an x-ray.  She will send me a message when this is done.  Ice, Tylenol, ibuprofen.  Declined any other pain medicine. Sleep hygiene recommendations verbalized and written down.  Counseling information provided  in paperwork.  She would prefer to avoid any pharmacologic therapy at this time but will let me know if she changes her mind. F/u as originally scheduled. The patient voiced understanding and agreement to the plan.  I spent 31 minutes with the patient discussing the above plans in addition to reviewing her chart on the same day of the visit.   [name], [place] 04/07/23  12:33 PM

## 2023-04-07 NOTE — Patient Instructions (Addendum)
 Heat (pad or rice pillow in microwave) over affected area, 10-15 minutes twice daily.   Ice/cold pack over area for 10-15 min twice daily.  OK to take Tylenol 1000 mg (2 extra strength tabs) or 975 mg (3 regular strength tabs) every 6 hours as needed.  Send me a message when you complete your X-ray.   Sleep Hygiene Tips: Do not watch TV or look at screens within 1 hour of going to bed. If you do, make sure there is a blue light filter (nighttime mode) involved. Try to go to bed around the same time every night. Wake up at the same time within 1 hour of regular time. Ex: If you wake up at 7 AM for work, do not sleep past 8 AM on days that you don't work. Do not drink alcohol before bedtime. Do not consume caffeine-containing beverages after noon or within 9 hours of intended bedtime. Get regular exercise/physical activity in your life, but not within 2 hours of planned bedtime. Do not take naps.  Do not eat within 2 hours of planned bedtime. Melatonin, 3-5 mg 30-60 minutes before planned bedtime may be helpful.  The bed should be for sleep or sex only. If after 20-30 minutes you are unable to fall asleep, get up and do something relaxing. Do this until you feel ready to go to sleep again.   Sleep is important to [place] all. Getting good sleep is imperative to adequate functioning during the day. Work with our counselors who are trained to help people obtain quality sleep. Call [phone] to schedule an appointment or if you are curious about insurance coverage/cost.  Let us know if you need anything.  EXERCISES  RANGE OF MOTION (ROM) AND STRETCHING EXERCISES These exercises may help you when beginning to rehabilitate your injury. While completing these exercises, remember:  Restoring tissue flexibility helps normal motion to return to the joints. This allows healthier, less painful movement and activity. An effective stretch should be held for at least 30 seconds. A stretch should never be  painful. You should only feel a gentle lengthening or release in the stretched tissue.  ROM - Pendulum Bend at the waist so that your right / left arm falls away from your body. Support yourself with your opposite hand on a solid surface, such as a table or a countertop. Your right / left arm should be perpendicular to the ground. If it is not perpendicular, you need to lean over farther. Relax the muscles in your right / left arm and shoulder as much as possible. Gently sway your hips and trunk so they move your right / left arm without any use of your right / left shoulder muscles. Progress your movements so that your right / left arm moves side to side, then forward and backward, and finally, both clockwise and counterclockwise. Complete 10-15 repetitions in each direction. Many people use this exercise to relieve discomfort in their shoulder as well as to gain range of motion. Repeat 2 times. Complete this exercise 3 times per week.  STRETCH - Flexion, Standing Stand with good posture. With an underhand grip on your right / left hand and an overhand grip on the opposite hand, grasp a broomstick or cane so that your hands are a little more than shoulder-width apart. Keeping your right / left elbow straight and shoulder muscles relaxed, push the stick with your opposite hand to raise your right / left arm in front of your body and then overhead. Raise your  arm until you feel a stretch in your right / left shoulder, but before you have increased shoulder pain. Try to avoid shrugging your right / left shoulder as your arm rises by keeping your shoulder blade tucked down and toward your mid-back spine. Hold 30 seconds. Slowly return to the starting position. Repeat 2 times. Complete this exercise 3 times per week.  STRETCH - Internal Rotation Place your right / left hand behind your back, palm-up. Throw a towel or belt over your opposite shoulder. Grasp the towel/belt with your right / left  hand. While keeping an upright posture, gently pull up on the towel/belt until you feel a stretch in the front of your right / left shoulder. Avoid shrugging your right / left shoulder as your arm rises by keeping your shoulder blade tucked down and toward your mid-back spine. Hold 30. Release the stretch by lowering your opposite hand. Repeat 2 times. Complete this exercise 3 times per week.  STRETCH - External Rotation and Abduction Stagger your stance through a doorframe. It does not matter which foot is forward. As instructed by your physician, physical therapist or athletic trainer, place your hands: And forearms above your head and on the door frame. And forearms at head-height and on the door frame. At elbow-height and on the door frame. Keeping your head and chest upright and your stomach muscles tight to prevent over-extending your low-back, slowly shift your weight onto your front foot until you feel a stretch across your chest and/or in the front of your shoulders. Hold 30 seconds. Shift your weight to your back foot to release the stretch. Repeat 2 times. Complete this stretch 3 times per week.   STRENGTHENING EXERCISES  These exercises may help you when beginning to rehabilitate your injury. They may resolve your symptoms with or without further involvement from your physician, physical therapist or athletic trainer. While completing these exercises, remember:  Muscles can gain both the endurance and the strength needed for everyday activities through controlled exercises. Complete these exercises as instructed by your physician, physical therapist or athletic trainer. Progress the resistance and repetitions only as guided. You may experience muscle soreness or fatigue, but the pain or discomfort you are trying to eliminate should never worsen during these exercises. If this pain does worsen, stop and make certain you are following the directions exactly. If the pain is still present  after adjustments, discontinue the exercise until you can discuss the trouble with your clinician. If advised by your physician, during your recovery, avoid activity or exercises which involve actions that place your right / left hand or elbow above your head or behind your back or head. These positions stress the tissues which are trying to heal.  STRENGTH - Scapular Depression and Adduction With good posture, sit on a firm chair. Supported your arms in front of you with pillows, arm rests or a table top. Have your elbows in line with the sides of your body. Gently draw your shoulder blades down and toward your mid-back spine. Gradually increase the tension without tensing the muscles along the top of your shoulders and the back of your neck. Hold for 3 seconds. Slowly release the tension and relax your muscles completely before completing the next repetition. After you have practiced this exercise, remove the arm support and complete it in standing as well as sitting. Repeat 2 times. Complete this exercise 3 times per week.   STRENGTH - External Rotators Secure a rubber exercise band/tubing to a fixed  object so that it is at the same height as your right / left elbow when you are standing or sitting on a firm surface. Stand or sit so that the secured exercise band/tubing is at your side that is not injured. Bend your elbow 90 degrees. Place a folded towel or small pillow under your right / left arm so that your elbow is a few inches away from your side. Keeping the tension on the exercise band/tubing, pull it away from your body, as if pivoting on your elbow. Be sure to keep your body steady so that the movement is only coming from your shoulder rotating. Hold 3 seconds. Release the tension in a controlled manner as you return to the starting position. Repeat 2 times. Complete this exercise 3 times per week.   STRENGTH - Supraspinatus Stand or sit with good posture. Grasp a 2-3 lb weight or an  exercise band/tubing so that your hand is "thumbs-up," like when you shake hands. Slowly lift your right / left hand from your thigh into the air, traveling about 30 degrees from straight out at your side. Lift your hand to shoulder height or as far as you can without increasing any shoulder pain. Initially, many people do not lift their hands above shoulder height. Avoid shrugging your right / left shoulder as your arm rises by keeping your shoulder blade tucked down and toward your mid-back spine. Hold for 3 seconds. Control the descent of your hand as you slowly return to your starting position. Repeat 2 times. Complete this exercise 3 times per week.   STRENGTH - Shoulder Extensors Secure a rubber exercise band/tubing so that it is at the height of your shoulders when you are either standing or sitting on a firm arm-less chair. With a thumbs-up grip, grasp an end of the band/tubing in each hand. Straighten your elbows and lift your hands straight in front of you at shoulder height. Step back away from the secured end of band/tubing until it becomes tense. Squeezing your shoulder blades together, pull your hands down to the sides of your thighs. Do not allow your hands to go behind you. Hold for 3 seconds. Slowly ease the tension on the band/tubing as you reverse the directions and return to the starting position. Repeat 2 times. Complete this exercise 3 times per week.   STRENGTH - Scapular Retractors Secure a rubber exercise band/tubing so that it is at the height of your shoulders when you are either standing or sitting on a firm arm-less chair. With a palm-down grip, grasp an end of the band/tubing in each hand. Straighten your elbows and lift your hands straight in front of you at shoulder height. Step back away from the secured end of band/tubing until it becomes tense. Squeezing your shoulder blades together, draw your elbows back as you bend them. Keep your upper arm lifted away from your  body throughout the exercise. Hold 3 seconds. Slowly ease the tension on the band/tubing as you reverse the directions and return to the starting position. Repeat 2 times. Complete this exercise 3 times per week.  STRENGTH - Scapular Depressors Find a sturdy chair without wheels, such as a from a dining room table. Keeping your feet on the floor, lift your bottom from the seat and lock your elbows. Keeping your elbows straight, allow gravity to pull your body weight down. Your shoulders will rise toward your ears. Raise your body against gravity by drawing your shoulder blades down your back, shortening the  distance between your shoulders and ears. Although your feet should always maintain contact with the floor, your feet should progressively support less body weight as you get stronger. Hold 3 seconds. In a controlled and slow manner, lower your body weight to begin the next repetition. Repeat 2 times. Complete this exercise 3 times per week.    This information is not intended to replace advice given to you by your health care provider. Make sure you discuss any questions you have with your health care provider.   Document Released: 11/13/2004 Document Revised: 01/20/2014 Document Reviewed: 04/13/2008 Elsevier Interactive Patient Education [employer]

## 2023-04-08 ENCOUNTER — Ambulatory Visit: Admitting: Sports Medicine

## 2023-04-08 VITALS — BP 130/80 | HR 87 | Ht 68.0 in | Wt 214.0 lb

## 2023-04-08 DIAGNOSIS — S92502A Displaced unspecified fracture of left lesser toe(s), initial encounter for closed fracture: Secondary | ICD-10-CM | POA: Diagnosis not present

## 2023-04-08 NOTE — Patient Instructions (Signed)
 Recommend using post op shoe Limiting physical activity as much as tolerated RICES rest, ice, compression, elevation  2 week follow up for repeat xray

## 2023-04-08 NOTE — Progress Notes (Signed)
    [name] D.[facility] [address] [place] [address] Phone: [phone]   Assessment and Plan:     1. Closed fracture of phalanx of left second toe, initial encounter - Acute, initial visit - Closed, nondisplaced fracture of proximal phalanx of left second toe based on x-ray from 04/07/2023.  Injury occurring on 04/06/2023 with patient accidentally kicking a light while swimming - Recommend using postop shoe for ambulation to prevent movement of second digit - Recommend Tylenol for day-to-day pain relief.  May use ibuprofen or other NSAID as needed for breakthrough pain - Rest, ice, elevation therapy -Recommend decreasing activity as much as possible.  Patient is planning on walking in a [employer] 5K, 10K, and 10 mile walk/run.  She hopes to participate as much as possible.  We discussed that ideally I do not recommend participating with any of these activities due to risk for toe displacement.   Pertinent previous records reviewed include toe x-ray 04/07/2023, family medicine x-ray 04/07/2023  Follow Up: 2 weeks for reevaluation.  Would repeat left foot x-ray as patient is planning on walking in a [employer] 5K and possibly 10K.   Subjective:   I, [name], am serving as a Neurosurgeon for Doctor Richardean Sale  Chief Complaint: left toe  pain   HPI:   04/08/23 Patient is a 53 year old female with left toe pain. Patient states pain started Monday. She was swimming and hit the light. She has post op shoe at home. She has twenty miles that she has to do next week. No meds for the pain.   Relevant Historical Information: CAD, OSA, GERD, hypothyroidism, history of DCIS, Gilberts syndrome  Additional pertinent review of systems negative.   Current Outpatient Medications:    gabapentin (NEURONTIN) 300 MG capsule, Take 2 capsules (600 mg total) by mouth at bedtime. (Patient taking differently: Take 300 mg by mouth at bedtime.), Disp: 60  capsule, Rfl: 3   levonorgestrel (MIRENA) 20 MCG/24HR IUD, 1 each by Intrauterine route once., Disp: , Rfl:    levothyroxine (SYNTHROID) 150 MCG tablet, TAKE 1 TABLET(150 MCG) BY MOUTH EVERY MORNING, Disp: 90 tablet, Rfl: 3   losartan (COZAAR) 25 MG tablet, TAKE 1 TABLET(25 MG) BY MOUTH DAILY, Disp: 90 tablet, Rfl: 3   metoprolol succinate (TOPROL-XL) 25 MG 24 hr tablet, TAKE 1 TABLET(25 MG) BY MOUTH DAILY, Disp: 90 tablet, Rfl: 3   omeprazole (PRILOSEC) 40 MG capsule, Take 1 capsule (40 mg total) by mouth 2 (two) times daily., Disp: 180 capsule, Rfl: 1   Objective:     Vitals:   04/08/23 1306  BP: 130/80  Pulse: 87  SpO2: 97%  Weight: 214 lb (97.1 kg)  Height: 5\' 8"  (1.727 m)      Body mass index is 32.54 kg/m.    Physical Exam:    Gen: Appears well, nad, nontoxic and pleasant Psych: Alert and oriented, appropriate mood and affect Neuro: sensation intact, strength is 5/5 with df/pf/inv/ev, muscle tone wnl Skin: no susupicious lesions or rashes  Right foot/ankle: Pain at second digit NTTP over fibular head, lat mal, medial mal, achilles, navicular, base of 5th, ATFL, CFL, deltoid, calcaneous or midfoot ROM DF 30, PF 45, inv/ev intact Gait normal  Electronically signed by:  [name] [facility] 1:50 PM 04/08/23

## 2023-04-21 NOTE — Progress Notes (Unsigned)
    [name] D.[facility] [address] [place] [address] Phone: [phone]   Assessment and Plan:     There are no diagnoses linked to this encounter.  ***   Pertinent previous records reviewed include ***    Follow Up: ***     Subjective:   I, [name], am serving as a Neurosurgeon for Doctor Richardean Sale   Chief Complaint: left toe  pain    HPI:    04/08/23 Patient is a 53 year old female with left toe pain. Patient states pain started Monday. She was swimming and hit the light. She has post op shoe at home. She has twenty miles that she has to do next week. No meds for the pain.   04/22/2023 Patient states   Relevant Historical Information: CAD, OSA, GERD, hypothyroidism, history of DCIS, Gilberts syndrome Additional pertinent review of systems negative.   Current Outpatient Medications:    gabapentin (NEURONTIN) 300 MG capsule, Take 2 capsules (600 mg total) by mouth at bedtime. (Patient taking differently: Take 300 mg by mouth at bedtime.), Disp: 60 capsule, Rfl: 3   levonorgestrel (MIRENA) 20 MCG/24HR IUD, 1 each by Intrauterine route once., Disp: , Rfl:    levothyroxine (SYNTHROID) 150 MCG tablet, TAKE 1 TABLET(150 MCG) BY MOUTH EVERY MORNING, Disp: 90 tablet, Rfl: 3   losartan (COZAAR) 25 MG tablet, TAKE 1 TABLET(25 MG) BY MOUTH DAILY, Disp: 90 tablet, Rfl: 3   metoprolol succinate (TOPROL-XL) 25 MG 24 hr tablet, TAKE 1 TABLET(25 MG) BY MOUTH DAILY, Disp: 90 tablet, Rfl: 3   omeprazole (PRILOSEC) 40 MG capsule, Take 1 capsule (40 mg total) by mouth 2 (two) times daily., Disp: 180 capsule, Rfl: 1   Objective:     There were no vitals filed for this visit.    There is no height or weight on file to calculate BMI.    Physical Exam:    ***   Electronically signed by:  [name] [facility] 7:39 AM 04/21/23

## 2023-04-22 ENCOUNTER — Ambulatory Visit: Admitting: Sports Medicine

## 2023-04-22 ENCOUNTER — Ambulatory Visit (INDEPENDENT_AMBULATORY_CARE_PROVIDER_SITE_OTHER)

## 2023-04-22 ENCOUNTER — Encounter (INDEPENDENT_AMBULATORY_CARE_PROVIDER_SITE_OTHER): Payer: Self-pay

## 2023-04-22 VITALS — HR 58 | Ht 68.0 in | Wt 214.0 lb

## 2023-04-22 DIAGNOSIS — S92502G Displaced unspecified fracture of left lesser toe(s), subsequent encounter for fracture with delayed healing: Secondary | ICD-10-CM

## 2023-04-22 DIAGNOSIS — M79672 Pain in left foot: Secondary | ICD-10-CM | POA: Diagnosis not present

## 2023-04-22 DIAGNOSIS — M7732 Calcaneal spur, left foot: Secondary | ICD-10-CM | POA: Diagnosis not present

## 2023-04-22 DIAGNOSIS — S92902D Unspecified fracture of left foot, subsequent encounter for fracture with routine healing: Secondary | ICD-10-CM | POA: Diagnosis not present

## 2023-04-22 NOTE — Patient Instructions (Signed)
 Recommend post op shoe for minimum 2 weeks After can trial transitioning into supportive tennis shoe if pain free May stationary bike and elliptical but stop if painful  As needed follow up if no improvement 4 week follow up

## 2023-04-23 ENCOUNTER — Encounter: Payer: Self-pay | Admitting: Sports Medicine

## 2023-05-12 ENCOUNTER — Other Ambulatory Visit: Payer: Self-pay | Admitting: Family Medicine

## 2023-05-12 DIAGNOSIS — N951 Menopausal and female climacteric states: Secondary | ICD-10-CM

## 2023-06-17 DIAGNOSIS — L821 Other seborrheic keratosis: Secondary | ICD-10-CM | POA: Diagnosis not present

## 2023-06-17 DIAGNOSIS — D225 Melanocytic nevi of trunk: Secondary | ICD-10-CM | POA: Diagnosis not present

## 2023-06-17 DIAGNOSIS — L814 Other melanin hyperpigmentation: Secondary | ICD-10-CM | POA: Diagnosis not present

## 2023-06-17 DIAGNOSIS — D2261 Melanocytic nevi of right upper limb, including shoulder: Secondary | ICD-10-CM | POA: Diagnosis not present

## 2023-08-10 ENCOUNTER — Other Ambulatory Visit: Payer: Self-pay | Admitting: Family Medicine

## 2023-08-10 DIAGNOSIS — K219 Gastro-esophageal reflux disease without esophagitis: Secondary | ICD-10-CM

## 2023-08-16 ENCOUNTER — Encounter: Payer: Self-pay | Admitting: Family Medicine

## 2023-08-17 ENCOUNTER — Other Ambulatory Visit: Payer: Self-pay

## 2023-08-17 DIAGNOSIS — E890 Postprocedural hypothyroidism: Secondary | ICD-10-CM

## 2023-08-17 MED ORDER — LEVOTHYROXINE SODIUM 150 MCG PO TABS: 150.0000 ug | ORAL_TABLET | Freq: Every day | ORAL | 3 refills | Status: AC

## 2023-10-06 ENCOUNTER — Encounter: Payer: Self-pay | Admitting: Family Medicine

## 2023-10-06 ENCOUNTER — Ambulatory Visit (INDEPENDENT_AMBULATORY_CARE_PROVIDER_SITE_OTHER): Admitting: Family Medicine

## 2023-10-06 VITALS — BP 116/78 | HR 67 | Temp 98.9°F | Resp 18 | Ht 68.0 in | Wt 204.0 lb

## 2023-10-06 DIAGNOSIS — E890 Postprocedural hypothyroidism: Secondary | ICD-10-CM

## 2023-10-06 DIAGNOSIS — Z0000 Encounter for general adult medical examination without abnormal findings: Secondary | ICD-10-CM | POA: Diagnosis not present

## 2023-10-06 DIAGNOSIS — Z23 Encounter for immunization: Secondary | ICD-10-CM | POA: Diagnosis not present

## 2023-10-06 NOTE — Patient Instructions (Addendum)
Give us 2-3 business days to get the results of your labs back.   Keep the diet clean and stay active.  Please get me a copy of your advanced directive form at your convenience.   Let us know if you need anything.  Ankle Exercises It is normal to feel mild stretching, pulling, tightness, or discomfort as you do these exercises, but you should stop right away if you feel sudden pain or your pain gets worse.  Stretching and range of motion exercises These exercises warm up your muscles and joints and improve the movement and flexibility of your ankle. These exercises also help to relieve pain, numbness, and tingling. Exercise A: Dorsiflexion/Plantar Flexion    Sit with your affected knee straight or bent. Do not rest your foot on anything. Flex your affected ankle to tilt the top of your foot toward your shin. Hold this position for 5 seconds. Point your toes downward to tilt the top of your foot away from your shin. Hold this position for 5 seconds. Repeat 2 times. Complete this exercise 3 times per week. Exercise B: Ankle Alphabet    Sit with your affected foot supported at your lower leg. Do not rest your foot on anything. Make sure your foot has room to move freely. Think of your affected foot as a paintbrush, and move your foot to trace each letter of the alphabet in the air. Keep your hip and knee still while you trace. Make the letters as large as you can without increasing any discomfort. Trace every letter from A to Z. Repeat 2 times. Complete this exercise 3 times per week. Strengthening exercises These exercises build strength and endurance in your ankle. Endurance is the ability to use your muscles for a long time, even after they get tired. Exercise D: Dorsiflexors    Secure a rubber exercise band or tube to an object, such as a table leg, that will stay still when the band is pulled. Secure the other end around your affected foot. Sit on the floor, facing the object  with your affected leg extended. The band or tube should be slightly tense when your foot is relaxed. Slowly flex your affected ankle and toes to bring your foot toward you. Hold this position for 3 seconds.  Slowly return your foot to the starting position, controlling the band as you do that. Do a total of 10 repetitions. Repeat 2 times. Complete this exercise 3 times per week. Exercise E: Plantar Flexors    Sit on the floor with your affected leg extended. Loop a rubber exercise band or tube around the ball of your affected foot. The ball of your foot is on the walking surface, right under your toes. The band or tube should be slightly tense when your foot is relaxed. Slowly point your toes downward, pushing them away from you. Hold this position for 3 seconds. Slowly release the tension in the band or tube, controlling smoothly until your foot is back in the starting position. Repeat for a total of 10 repetitions. Repeat 2 times. Complete this exercise 3 times per week. Exercise F: Towel Curls    Sit in a chair on a non-carpeted surface, and put your feet on the floor. Place a towel in front of your feet.  Keeping your heel on the floor, put your affected foot on the towel. Pull the towel toward you by grabbing the towel with your toes and curling them under. Keep your heel on the floor. Let   your toes relax. Grab the towel again. Keep going until the towel is completely underneath your foot. Repeat for a total of 10 repetitions. Repeat 2 times. Complete this exercise 3 times per week. Exercise G: Heel Raise ( Plantar Flexors, Standing)     Stand with your feet shoulder-width apart. Keep your weight spread evenly over the width of your feet while you rise up on your toes. Use a wall or table to steady yourself, but try not to use it for support. If this exercise is too easy, try these options: Shift your weight toward your affected leg until you feel challenged. If told by your  health care provider, lift your uninjured leg off the floor. Hold this position for 3 seconds. Repeat for a total of 10 repetitions. Repeat 2 times. Complete this exercise 3 times per week. Exercise H: Tandem Walking Stand with one foot directly in front of the other. Slowly raise your back foot up, lifting your heel before your toes, and place it directly in front of your other foot. Continue to walk in this heel-to-toe way for 10 steps or for as long as told by your health care provider. Have a countertop or wall nearby to use if needed to keep your balance, but try not to hold onto anything for support. Repeat 2 times. Complete this exercises 3 times per week. Make sure you discuss any questions you have with your health care provider. Document Released: 11/13/2004 Document Revised: 08/30/2015 Document Reviewed: 09/17/2014 Elsevier Interactive Patient Education  2018 [employer] 

## 2023-10-06 NOTE — Progress Notes (Signed)
 Chief Complaint  Patient presents with   Annual Exam    Doing well , no complaints      Well Woman [name] is here for a complete physical.   Her last physical was >1 year ago.  Current diet: in general, a healthy diet. Current exercise: running. Weight is stable and she confirms some fatigue out of ordinary. Seatbelt? Yes Advanced directive? No  Health Maintenance Pap/HPV- Yes Mammogram- Yes Colon cancer screening-Yes Shingrix- Yes Tetanus- Due Hep C screening- Yes HIV screening- Yes  Past Medical History:  Diagnosis Date   Allergy    Anemia    IDA   Arthritis    Blood transfusion without reported diagnosis    breast cancer 2016   Breast cancer (HCC) 12/2014   Seeing [facility] doctors   CAD (coronary artery disease)    open heart surgery 09/12/10   Cancer (HCC) 2007   thyroid    Family hx of colon cancer    GERD (gastroesophageal reflux disease)    Gilbert disease    Heart disease    History of thyroid  cancer    Hypothyroid    Overweight(278.02)    PONV (postoperative nausea and vomiting)      Past Surgical History:  Procedure Laterality Date   arm surgery     rt   BILATERAL TOTAL MASTECTOMY WITH AXILLARY LYMPH NODE DISSECTION     COLONOSCOPY     coronary artery bypass grafting x1  09/13/2010   Fleeta Ochoa   HERNIA REPAIR  1977   inguinal, ?side   KNEE ARTHROSCOPY Right 09/13/2015   pt reported   THYROIDECTOMY     ULNAR NERVE REPAIR Right 1989, 2002   UPPER GASTROINTESTINAL ENDOSCOPY      Medications  Current Outpatient Medications on File Prior to Visit  Medication Sig Dispense Refill   gabapentin  (NEURONTIN ) 300 MG capsule Take 1 capsule (300 mg total) by mouth at bedtime. 90 capsule 3   levonorgestrel  (MIRENA ) 20 MCG/24HR IUD 1 each by Intrauterine route once.     levothyroxine  (SYNTHROID ) 150 MCG tablet Take 1 tablet (150 mcg total) by mouth daily before breakfast. TAKE 1 TABLET(150 MCG) BY MOUTH EVERY MORNING 90 tablet 3   losartan   (COZAAR ) 25 MG tablet TAKE 1 TABLET(25 MG) BY MOUTH DAILY 90 tablet 3   metoprolol  succinate (TOPROL -XL) 25 MG 24 hr tablet TAKE 1 TABLET(25 MG) BY MOUTH DAILY 90 tablet 3   omeprazole  (PRILOSEC) 40 MG capsule TAKE 1 CAPSULE(40 MG) BY MOUTH TWICE DAILY 180 capsule 1    Allergies No Known Allergies  Review of Systems: Constitutional:  no unexpected weight changes Eye:  no recent significant change in vision Ear/Nose/Mouth/Throat:  Ears:  no recent change in hearing Nose/Mouth/Throat:  no complaints of nasal congestion, no sore throat Cardiovascular: no chest pain Respiratory:  no shortness of breath Gastrointestinal:  no abdominal pain, no change in bowel habits GU:  Female: negative for dysuria or pelvic pain Musculoskeletal/Extremities:  no new pain of the joints Integumentary (Skin/Breast):  no abnormal skin lesions reported Neurologic:  no headaches Endocrine:  + fatigue  Exam BP 116/78   Pulse 67   Temp 98.9 F (37.2 C)   Resp 18   Ht 5' 8 (1.727 m)   Wt 204 lb (92.5 kg)   SpO2 98%   BMI 31.02 kg/m  General:  well developed, well nourished, in no apparent distress Skin:  no significant moles, warts, or growths Head:  no masses, lesions, or tenderness Eyes:  pupils equal and round, sclera anicteric without injection Ears:  canals without lesions, TMs shiny without retraction, no obvious effusion, no erythema Nose:  nares patent, mucosa normal, and no drainage  Throat/Pharynx:  lips and gingiva without lesion; tongue and uvula midline; non-inflamed pharynx; no exudates or postnasal drainage Neck: neck supple without adenopathy, thyromegaly, or masses Lungs:  clear to auscultation, breath sounds equal bilaterally, no respiratory distress Cardio:  regular rate and rhythm, no LE edema Abdomen:  abdomen soft, nontender; bowel sounds normal; no masses or organomegaly Genital: Defer to GYN Musculoskeletal:  symmetrical muscle groups noted without atrophy or  deformity Extremities:  no clubbing, cyanosis, or edema, no deformities, no skin discoloration Neuro:  gait normal; deep tendon reflexes normal and symmetric Psych: well oriented with normal range of affect and appropriate judgment/insight  Assessment and Plan  Well adult exam - Plan: CBC, Comprehensive metabolic panel with GFR, Lipid panel, Hepatitis B surface antibody,quantitative  Need for Tdap vaccination - Plan: Tdap vaccine greater than or equal to 7yo IM  Need for pneumococcal vaccination - Plan: Pneumococcal conjugate vaccine 20-valent (Prevnar 20)  Postoperative hypothyroidism - Plan: TSH, T4, free   Well 53 y.o. female. Counseled on diet and exercise. Advanced directive form provided today.  PCV 20 and tetanus shot today. Other orders as above. Follow up in 6 mo. The patient voiced understanding and agreement to the plan.  Mabel [place], DO 10/06/23 3:42 PM

## 2023-10-07 ENCOUNTER — Ambulatory Visit: Payer: Self-pay | Admitting: Family Medicine

## 2023-10-07 LAB — TSH: TSH: 0.06 u[IU]/mL — ABNORMAL LOW (ref 0.35–5.50)

## 2023-10-07 LAB — CBC
HCT: 38.2 % (ref 36.0–46.0)
Hemoglobin: 13.1 g/dL (ref 12.0–15.0)
MCHC: 34.2 g/dL (ref 30.0–36.0)
MCV: 84.7 fl (ref 78.0–100.0)
Platelets: 242 K/uL (ref 150.0–400.0)
RBC: 4.52 Mil/uL (ref 3.87–5.11)
RDW: 12.7 % (ref 11.5–15.5)
WBC: 5.6 K/uL (ref 4.0–10.5)

## 2023-10-07 LAB — COMPREHENSIVE METABOLIC PANEL WITH GFR
ALT: 24 U/L (ref 0–35)
AST: 21 U/L (ref 0–37)
Albumin: 4.4 g/dL (ref 3.5–5.2)
Alkaline Phosphatase: 118 U/L — ABNORMAL HIGH (ref 39–117)
BUN: 11 mg/dL (ref 6–23)
CO2: 26 meq/L (ref 19–32)
Calcium: 10 mg/dL (ref 8.4–10.5)
Chloride: 108 meq/L (ref 96–112)
Creatinine, Ser: 0.92 mg/dL (ref 0.40–1.20)
GFR: 70.45 mL/min (ref >60.00)
Glucose, Bld: 101 mg/dL — ABNORMAL HIGH (ref 70–99)
Potassium: 3.9 meq/L (ref 3.5–5.1)
Sodium: 142 meq/L (ref 135–145)
Total Bilirubin: 1.1 mg/dL (ref 0.2–1.2)
Total Protein: 6.5 g/dL (ref 6.0–8.3)

## 2023-10-07 LAB — LIPID PANEL
Cholesterol: 152 mg/dL (ref 0–200)
HDL: 38.3 mg/dL — ABNORMAL LOW (ref >39.00)
LDL Cholesterol: 81 mg/dL (ref 0–99)
NonHDL: 114.07
Total CHOL/HDL Ratio: 4
Triglycerides: 164 mg/dL — ABNORMAL HIGH (ref 0.0–149.0)
VLDL: 32.8 mg/dL (ref 0.0–40.0)

## 2023-10-07 LAB — T4, FREE: Free T4: 1.45 ng/dL (ref 0.60–1.60)

## 2023-10-08 LAB — HEPATITIS B SURFACE ANTIBODY, QUANTITATIVE: Hep B S AB Quant (Post): <5 m[IU]/mL — ABNORMAL LOW (ref >=10)

## 2023-10-23 DIAGNOSIS — H40013 Open angle with borderline findings, low risk, bilateral: Secondary | ICD-10-CM | POA: Diagnosis not present

## 2023-11-06 DIAGNOSIS — M25571 Pain in right ankle and joints of right foot: Secondary | ICD-10-CM | POA: Diagnosis not present

## 2023-11-06 DIAGNOSIS — M76811 Anterior tibial syndrome, right leg: Secondary | ICD-10-CM | POA: Diagnosis not present

## 2023-11-06 DIAGNOSIS — M6701 Short Achilles tendon (acquired), right ankle: Secondary | ICD-10-CM | POA: Diagnosis not present

## 2023-12-03 ENCOUNTER — Ambulatory Visit: Admitting: Family Medicine

## 2023-12-03 ENCOUNTER — Other Ambulatory Visit (HOSPITAL_COMMUNITY)
Admission: RE | Admit: 2023-12-03 | Discharge: 2023-12-03 | Disposition: A | Discharge status: Home / Self Care | Source: Ambulatory Visit | Attending: Family Medicine | Admitting: Family Medicine

## 2023-12-03 VITALS — BP 112/69 | HR 67 | Ht 68.0 in | Wt 204.0 lb

## 2023-12-03 DIAGNOSIS — Z1231 Encounter for screening mammogram for malignant neoplasm of breast: Secondary | ICD-10-CM | POA: Diagnosis not present

## 2023-12-03 DIAGNOSIS — Z23 Encounter for immunization: Secondary | ICD-10-CM

## 2023-12-03 DIAGNOSIS — Z01419 Encounter for gynecological examination (general) (routine) without abnormal findings: Secondary | ICD-10-CM | POA: Insufficient documentation

## 2023-12-03 DIAGNOSIS — N951 Menopausal and female climacteric states: Secondary | ICD-10-CM

## 2023-12-03 MED ORDER — GABAPENTIN 300 MG PO CAPS: 600.0000 mg | ORAL_CAPSULE | Freq: Every day | ORAL | 3 refills | Status: AC

## 2023-12-03 NOTE — Progress Notes (Signed)
 ANNUAL EXAM Patient name: [name] MRN [id number]  Date of birth: 1970-12-04 Chief Complaint:   Annual Exam  History of Present Illness:   [name] is a 53 y.o.  No obstetric history on file.  female  being seen today for a routine annual exam.  Current complaints: hot flashes. Increased gabapentin  to 600mg  by PCP, which helps.  No LMP recorded (lmp unknown). (Menstrual status: IUD).    Last pap 2024. Results were: ASCUS w/ HRHPV negative. H/O abnormal pap: no Last mammogram: h/o double mastectomy due to breast cancer Last colonoscopy: 2023. Results were: normal. Family h/o colorectal cancer: yes mother     12/03/2023   10:01 AM 12/04/2022    1:54 PM 09/03/2022    7:00 AM 08/27/2021    7:29 AM 02/07/2021    1:53 PM  Depression screen PHQ 2/9  Decreased Interest 0 0 0 0 0  Down, Depressed, Hopeless 0 0 0 0 0  PHQ - 2 Score 0 0 0 0 0  Altered sleeping 0 0 0 0   Tired, decreased energy 0 0 0 0   Change in appetite 0 0 0 0   Feeling bad or failure about yourself  0 0 0 0   Trouble concentrating 0 0 0 0   Moving slowly or fidgety/restless 0 0 0 0   Suicidal thoughts 0 0 0 0   PHQ-9 Score 0 0  0  0    Difficult doing work/chores   Not difficult at all Not difficult at all      Data saved with a previous flowsheet row definition        12/03/2023   10:02 AM 12/04/2022    1:54 PM  GAD 7 : Generalized Anxiety Score  Nervous, Anxious, on Edge 0 0  Control/stop worrying 0 0  Worry too much - different things 0 0  Trouble relaxing 0 0  Restless 0 0  Easily annoyed or irritable 0 0  Afraid - awful might happen 0 0  Total GAD 7 Score 0 0     Review of Systems:   Pertinent items are noted in HPI Denies any headaches, blurred vision, fatigue, shortness of breath, chest pain, abdominal pain, abnormal vaginal discharge/itching/odor/irritation, problems with periods, bowel movements, urination, or intercourse unless otherwise stated above. Pertinent  History Reviewed:  Reviewed past medical,surgical, social and family history.  Reviewed problem list, medications and allergies. Physical Assessment:   There were no vitals filed for this visit.There is no height or weight on file to calculate BMI.        Physical Examination:   General appearance - well appearing, and in no distress  Mental status - alert, oriented to person, place, and time  Psych:  She has a normal mood and affect  Skin - warm and dry, normal color, no suspicious lesions noted  Chest - effort normal, all lung fields clear to auscultation bilaterally  Heart - normal rate and regular rhythm  Neck:  midline trachea, no thyromegaly or nodules  Breasts - breasts appear normal, no suspicious masses, no skin or nipple changes or axillary nodes  Abdomen - soft, nontender, nondistended, no masses or organomegaly  Pelvic - VULVA: normal appearing vulva with no masses, tenderness or lesions  VAGINA: normal appearing vagina with normal color and discharge, no lesions  CERVIX: normal appearing cervix without discharge or lesions, no CMT  Thin prep pap is not done   UTERUS: uterus is felt to be  normal size, shape, consistency and nontender   ADNEXA: No adnexal masses or tenderness noted.  Extremities:  No swelling or varicosities noted  Chaperone present for exam  Assessment & Plan:  1. Well woman exam with routine gynecological exam Up to date  2. Need for influenza vaccination (Primary) - Flu vaccine trivalent PF, 6mos and older(Flulaval,Afluria,Fluarix,Fluzone)  3. Hot flashes due to menopause - gabapentin  (NEURONTIN ) 300 MG capsule; Take 2 capsules (600 mg total) by mouth at bedtime.  Dispense: 180 capsule; Refill: 3 - FSH/LH    Labs/procedures today:   Orders Placed This Encounter  Procedures   Flu vaccine trivalent PF, 6mos and older(Flulaval,Afluria,Fluarix,Fluzone)    Meds:  Meds ordered this encounter  Medications   gabapentin  (NEURONTIN ) 300 MG capsule     Sig: Take 2 capsules (600 mg total) by mouth at bedtime.    Dispense:  180 capsule    Refill:  3    ZERO refills remain on this prescription. Your patient is requesting advance approval of refills for this medication to PREVENT ANY MISSED DOSES    Follow-up: No follow-ups on file.  Gina Corsetti J Xitlalli Newhard, DO 12/03/2023 10:03 AM

## 2023-12-03 NOTE — Progress Notes (Signed)
 Patient presents for Annual.  LMP: No LMP recorded (lmp unknown). (Menstrual status: IUD).  Last pap: 10/12/2020 Contraception: IUD: 03/30/2016 Mammogram: 2016 breast cancer survivor  STD Screening: Declines Flu Vaccine : Accepts  CC:   Fun Fact: breast cancer survivor

## 2023-12-04 LAB — FSH/LH
FSH: 65.2 m[IU]/mL
LH: 37.3 m[IU]/mL

## 2023-12-07 ENCOUNTER — Ambulatory Visit: Payer: Self-pay | Admitting: Family Medicine

## 2023-12-07 NOTE — Progress Notes (Signed)
 ANNUAL EXAM Patient name: [name] MRN [id number]  Date of birth: 16-Jul-1970 Chief Complaint:   Annual Exam  History of Present Illness:   [name] is a 53 y.o.  No obstetric history on file.  female  being seen today for a routine annual exam.  Current complaints: none. Was having increased hot flashes and nightsweats. Saw PCP recently and had gabapentin  increased. Has run out of gabapentin . Increased dose is helpful.  No LMP recorded (lmp unknown). (Menstrual status: IUD).    Last pap 11/2022. Results were: ASCUS w/ HRHPV negative. H/O abnormal pap: no Last mammogram: s/p double mastectomy Last colonoscopy: 10/2021.      12/03/2023   10:01 AM 12/04/2022    1:54 PM 09/03/2022    7:00 AM 08/27/2021    7:29 AM 02/07/2021    1:53 PM  Depression screen PHQ 2/9  Decreased Interest 0 0 0 0 0  Down, Depressed, Hopeless 0 0 0 0 0  PHQ - 2 Score 0 0 0 0 0  Altered sleeping 0 0 0 0   Tired, decreased energy 0 0 0 0   Change in appetite 0 0 0 0   Feeling bad or failure about yourself  0 0 0 0   Trouble concentrating 0 0 0 0   Moving slowly or fidgety/restless 0 0 0 0   Suicidal thoughts 0 0 0 0   PHQ-9 Score 0 0  0  0    Difficult doing work/chores   Not difficult at all Not difficult at all      Data saved with a previous flowsheet row definition        12/03/2023   10:02 AM 12/04/2022    1:54 PM  GAD 7 : Generalized Anxiety Score  Nervous, Anxious, on Edge 0 0  Control/stop worrying 0 0  Worry too much - different things 0 0  Trouble relaxing 0 0  Restless 0 0  Easily annoyed or irritable 0 0  Afraid - awful might happen 0 0  Total GAD 7 Score 0 0     Review of Systems:   Pertinent items are noted in HPI Denies any headaches, blurred vision, fatigue, shortness of breath, chest pain, abdominal pain, abnormal vaginal discharge/itching/odor/irritation, problems with periods, bowel movements, urination, or intercourse unless otherwise stated  above. Pertinent History Reviewed:  Reviewed past medical,surgical, social and family history.  Reviewed problem list, medications and allergies. Physical Assessment:   Vitals:   12/03/23 0906  BP: 112/69  Pulse: 67  Weight: 204 lb 0.6 oz (92.6 kg)  Height: 5' 8 (1.727 m)  Body mass index is 31.02 kg/m.        Physical Examination:   General appearance - well appearing, and in no distress  Mental status - alert, oriented to person, place, and time  Psych:  She has a normal mood and affect  Skin - warm and dry, normal color, no suspicious lesions noted  Chest - effort normal, all lung fields clear to auscultation bilaterally  Heart - normal rate and regular rhythm  Neck:  midline trachea, no thyromegaly or nodules  Breasts - breasts appear normal, no suspicious masses, no skin or nipple changes or axillary nodes  Abdomen - soft, nontender, nondistended, no masses or organomegaly  Pelvic - VULVA: normal appearing vulva with no masses, tenderness or lesions  VAGINA: normal appearing vagina with normal color and discharge, no lesions  CERVIX: normal appearing cervix without discharge or lesions, no  CMT  Thin prep pap is not done   UTERUS: uterus is felt to be normal size, shape, consistency and nontender   ADNEXA: No adnexal masses or tenderness noted.  Extremities:  No swelling or varicosities noted  Chaperone present for exam  Assessment & Plan:  1. Encounter for well woman exam with routine gynecological exam (Primary) - Cytology - PAP  2. Screening mammogram for breast cancer No mammograms needed   3. Need for influenza vaccination   Labs/procedures today:   Orders Placed This Encounter  Procedures   MM 3D SCREENING MAMMOGRAM BILATERAL BREAST    Meds: No orders of the defined types were placed in this encounter.   Follow-up: No follow-ups on file.  Eira Alpert J Vaden Becherer, DO 12/07/2023 2:42 PM

## 2023-12-08 ENCOUNTER — Ambulatory Visit: Payer: Self-pay | Admitting: Family Medicine

## 2023-12-08 LAB — CYTOLOGY - PAP
Comment: NEGATIVE
Diagnosis: NEGATIVE
High risk HPV: NEGATIVE

## 2024-01-17 ENCOUNTER — Encounter: Payer: Self-pay | Admitting: Family Medicine

## 2024-01-17 DIAGNOSIS — Z853 Personal history of malignant neoplasm of breast: Secondary | ICD-10-CM

## 2024-01-17 DIAGNOSIS — Z85850 Personal history of malignant neoplasm of thyroid: Secondary | ICD-10-CM

## 2024-01-18 ENCOUNTER — Other Ambulatory Visit: Payer: Self-pay | Admitting: Family Medicine

## 2024-01-18 DIAGNOSIS — Z853 Personal history of malignant neoplasm of breast: Secondary | ICD-10-CM

## 2024-01-18 DIAGNOSIS — Z85850 Personal history of malignant neoplasm of thyroid: Secondary | ICD-10-CM

## 2024-02-01 ENCOUNTER — Other Ambulatory Visit: Payer: Self-pay | Admitting: Cardiovascular Disease

## 2024-02-01 ENCOUNTER — Ambulatory Visit: Attending: Cardiovascular Disease | Admitting: Cardiovascular Disease

## 2024-02-01 ENCOUNTER — Other Ambulatory Visit: Payer: Self-pay | Admitting: Family Medicine

## 2024-02-01 ENCOUNTER — Encounter: Payer: Self-pay | Admitting: Cardiovascular Disease

## 2024-02-01 VITALS — BP 108/78 | HR 70 | Ht 68.0 in | Wt 208.4 lb

## 2024-02-01 DIAGNOSIS — I10 Essential (primary) hypertension: Secondary | ICD-10-CM

## 2024-02-01 DIAGNOSIS — K219 Gastro-esophageal reflux disease without esophagitis: Secondary | ICD-10-CM

## 2024-02-01 DIAGNOSIS — I25118 Atherosclerotic heart disease of native coronary artery with other forms of angina pectoris: Secondary | ICD-10-CM | POA: Diagnosis not present

## 2024-02-01 NOTE — Progress Notes (Signed)
 " Cardiology Office Note:    Date:  02/01/2024   ID:  [name], DOB [birth year], MRN [id number]  PCP:  Frann Mabel Mt, DO   [facility] Providers Cardiologist:  Ozell Fell, MD     Referring MD: Frann Mabel Mt, DO   Chief Complaint  Patient presents with   Shortness of Breath    History of Present Illness:    [name] is a 54 y.o. female with a hx of coronary artery dissection, presenting today for follow-up evaluation.  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 She underwent single-vessel CABG. Follow-up gated cardiac CTA demonstrated healing of the native RCA and continued patency of the saphenous vein graft RCA. LV function at follow-up has been normal with an ejection fraction of 55-60%.    The patient is here alone today. She recently did a marathon, describes her pace as a walk-jog. Near the end of the race, she felt short of breath, heart racing with HR 160 bpm on her Smart Watch, reporting that it was a very hot day. She had shortness of breath during exercise 2 other times when she was training. She also felt a tightness in the chest, but more of a 'breathing problem' when she felt like she couldn't get enough air. She also reports problems with GERD, taking 2 Prilosec daily. She has had burning in her throat with this, more pronounced than in the past. She's been taking a lot of Tums in addition to the Prilosec. There is also a cough associated with this.   Past Medical History:  Diagnosis Date   Allergy    Anemia    IDA   Arthritis    Blood transfusion without reported diagnosis    breast cancer 2016   Breast cancer (HCC) 12/2014   Seeing [facility] doctors   CAD (coronary artery disease)    open heart surgery 09/12/10   Cancer (HCC) 2007   thyroid    Family hx of colon cancer    GERD (gastroesophageal reflux  disease)    Gilbert disease    Heart disease    History of thyroid  cancer    Hypothyroid    Overweight(278.02)    PONV (postoperative nausea and vomiting)     Current Medications: Active Medications[1]   Allergies:   Patient has no known allergies.   ROS:   Please see the history of present illness.    All other systems reviewed and are negative.  EKGs/Labs/Other Studies Reviewed:    The following studies were reviewed today: Cardiac Studies & Procedures   ______________________________________________________________________________________________   STRESS TESTS  ECHOCARDIOGRAM STRESS TEST 06/05/2020  Narrative EXERCISE STRESS ECHO REPORT   --------------------------------------------------------------------------------  Patient Name:   [name] Date of Exam: 06/05/2020 Medical Rec #:  [id number]          Height:       68.0 in Accession #:    [id number]         Weight:       205.2 lb Date of Birth:  [birth year]         BSA:          2.066 m Patient Age:    49 years           BP:           155/106 mmHg Patient Gender: F  HR:           76 bpm. Exam Location:  [facility]  Procedure: Limited Echo, Limited Color Doppler, Cardiac Doppler and Stress Echo  Indications:    I25.10 CAD  History:        Patient has prior history of Echocardiogram examinations, most recent 10/29/2012.  Sonographer:    Elsie Bohr RDCS Referring Phys: [phone] Owen Pratte   Sonographer Comments: [facility] DPR SIGNED ON 01/29/19. OK TO SPEAK TO SPOUSE BRYAN Capuano, DAUGHTER LAUREN Rivest, AND MOTHER ELEANOR MCCOY IMPRESSIONS   1. This is a negative stress echocardiogram for ischemia. 2. This is a low risk study.  FINDINGS  Exam Protocol: The patient exercised on a treadmill according to a Bruce protocol. Definity  contrast agent was given IV to delineate the left ventricular endocardial borders.   Patient Performance: The patient exercised for 8 minutes  achieving 10 METS. The maximum stage achieved was III of the Bruce protocol. The heart rate at peak stress was 176 bpm. The target heart rate was calculated to be 143 bpm. The percentage of maximum predicted heart rate achieved was 104.4 %. The baseline blood pressure was 155/106 mmHg. The blood pressure at peak stress was 190/88 mmHg. The patient developed shortness of breath during the stress exam. The symptoms resolved with rest.  EKG: Resting EKG showed normal sinus rhythm. The patient developed no abnormal EKG findings and Motion artifact during exercise.   2D Echo Findings: The baseline ejection fraction was 60%. The peak ejection fraction at stress was 80%. Baseline regional wall motion abnormalities were not present. There were no stress-induced wall motion abnormalities. This is a negative stress echocardiogram for ischemia.  Stress Doppler:  Mitral Regurgitation: At baseline, trivial mitral regurgitation was present.   Vina Gull MD Electronically signed on 06/15/2020 at 5:40:36 PM      Final        CT SCANS  CT CORONARY MORPH W/CTA COR W/SCORE 07/07/2011  Addendum 07/08/2011  8:37 AM **ADDENDUM** CREATED: 07/08/2011 08:28:01  OVER-READ INTERPRETATION - CT CHEST  The following report is an over-read performed by radiologist Dr. Rockey BIRCH. Marthann, M.D. of [facility], [place] on 07/08/2011 08:28:01.  This over-read does not include interpretation of cardiac or coronary anatomy or pathology.  The CTA interpretation by the cardiologist is attached.  Comparison:  Plain film chest of 10/09/2010.  No prior chest CTs.  Findings: Lung windows demonstrate no nodules or airspace opacities.  Soft tissue windows demonstrate normal imaged thoracic aorta without dissection. No pericardial or pleural effusion.  Prior median sternotomy No mediastinal or hilar adenopathy.  Limited evaluation for pulmonary embolism, secondary bolus timing.  Limited abdominal imaging demonstrates  no significant findings.  No acute osseous abnormality.  IMPRESSION: No extracardiac findings within the imaged chest.  **END ADDENDUM** SIGNED BY: Rockey BIRCH. Marthann, M.D.  Addendum 07/07/2011  3:01 PM **ADDENDUM** CREATED: 07/07/2011 14:59:29  [facility] to read noncardiac findings.  **END ADDENDUM** SIGNED BY: Ezra CANDIE Shuck  Narrative CARDIAC CTA WITH CALCIUM SCORE 07/07/2011 13:17:00  Ordering Physician: DENTON  Reading Physician: DaltonS.Mclean  Protocol:  The patient scanned on a Philips 256 slice scanner. Prospective gating with 5% phase tolerance at 120 kV was used.   A total of 15 mg of IV Lopressor  was administered.  Average heart rate during the scan was 65 beats per minute.  After an initial AP and lateral topogram, 3 mm axial slices were performed through the heart for calcium scoring.  The patient then had a  80 ml bolus of contrast given for coronary CTA with bolus tracking in the descending thoracic aorta.  The 3-D data set was then sent to the [employer].  Reconstructions were done using MIP,MPR and VRT modes.  Indications: Chest pain, h/o spontaneous RCA dissection  DETAILED FINDINGS:  Quality of Study: Good  Left Main: No plaque or stenosis  Left Anterior Descending: No plaque or stenosis  Left Circumflex: Large OM1, small AV LCX beyond OM1.  No plaque or stenosis.  Right Coronary Artery: Dominant vessel.  No plaque or stenosis. There did not appear to be any significant perivascular thickening suggestive of hematoma (prior proximal to mid RCA dissection). There was a patent saphenous vein graft to the distal RCA with no significant disease.  Coronary Calcium Score: 0 Agatston units  IMPRESSION: 1. No plaque or stenosis in the coronaries with coronary artery calcium score = 0 Agatston units.  From the stand point of traditional risk factors, this conveys low risk of future cardiac events.  2. Prior RCA dissection with  SVG-distal RCA.  The bypass graft is patent without significant disease.  I do not see significant perivascular/intramural thickening around the RCA and it appears widely patent.  Original Report Authenticated By: ROCKEY CHARM KILTS, M.D.     ______________________________________________________________________________________________      EKG:   EKG Interpretation Date/Time:  Monday February 01 2024 08:37:47 EST Ventricular Rate:  70 PR Interval:  210 QRS Duration:  88 QT Interval:  400 QTC Calculation: 432 R Axis:   -22  Text Interpretation: Sinus rhythm with 1st degree A-V block Cannot rule out Anterior infarct (cited on or before 14-Sep-2010) When compared with ECG of 05-Feb-2023 08:24, Criteria for Inferior infarct are no longer Present Confirmed by Wonda Sharper [phone]) on 02/01/2024 8:44:05 AM    Recent Labs: 10/06/2023: ALT 24; BUN 11; Creatinine, Ser 0.92; Hemoglobin 13.1; Platelets 242.0; Potassium 3.9; Sodium 142; TSH 0.06  Recent Lipid Panel    Component Value Date/Time   CHOL 152 10/06/2023 1523   TRIG 164.0 (H) 10/06/2023 1523   HDL 38.30 (L) 10/06/2023 1523   CHOLHDL 4 10/06/2023 1523   VLDL 32.8 10/06/2023 1523   LDLCALC 81 10/06/2023 1523     Risk Assessment/Calculations:                Physical Exam:    VS:  BP 108/78 (BP Location: Left Arm)   Pulse 70   Ht 5' 8 (1.727 m)   Wt 208 lb 6.4 oz (94.5 kg)   SpO2 94%   BMI 31.69 kg/m     Wt Readings from Last 3 Encounters:  02/01/24 208 lb 6.4 oz (94.5 kg)  12/03/23 204 lb 0.6 oz (92.6 kg)  10/06/23 204 lb (92.5 kg)     GEN:  Well nourished, well developed in no acute distress HEENT: Normal NECK: No JVD; No carotid bruits LYMPHATICS: No lymphadenopathy CARDIAC: RRR, no murmurs, rubs, gallops RESPIRATORY:  Clear to auscultation without rales, wheezing or rhonchi  ABDOMEN: Soft, non-tender, non-distended MUSCULOSKELETAL:  No edema; No deformity  SKIN: Warm and dry NEUROLOGIC:  Alert and  oriented x 3 PSYCHIATRIC:  Normal affect   Assessment & Plan Essential hypertension Blood pressure controlled on losartan  and metoprolol  succinate.  Continue the same Coronary artery disease involving native coronary artery of native heart with other form of angina pectoris S/P CABG, remote hx of spontaneous coronary artery dissection.  With her recent symptoms of breathlessness, increased throat burning possibly related to GERD but  still warranting evaluation, and atypical chest discomfort that occurred during exercise, I think stress testing is indicated.  I have recommended an exercise stress echocardiogram which she had in 2022 and was negative for ischemia at that time.  Continue current medical therapy.  Will check a stress echocardiogram as outlined above.  I will see the patient back in 1 year unless her stress echo shows significant abnormalities.          Medication Adjustments/Labs and Tests Ordered: Current medicines are reviewed at length with the patient today.  Concerns regarding medicines are outlined above.  Orders Placed This Encounter  Procedures   EKG 12-Lead   ECHOCARDIOGRAM STRESS TEST   No orders of the defined types were placed in this encounter.   Patient Instructions  Medication Instructions:  No medication changes were made at this visit. Continue current regimen.   *If you need a refill on your cardiac medications before your next appointment, please call your pharmacy*  Lab Work: None ordered today. If you have labs (blood work) drawn today and your tests are completely normal, you will receive your results only by: MyChart Message (if you have MyChart) OR A paper copy in the mail If you have any lab test that is abnormal or we need to change your treatment, we will call you to review the results.  Testing/Procedures: Your physician has requested that you have a stress echocardiogram. For further information please visit [email or web address] Please  follow instruction sheet as given.  Please note: We ask at that you not bring children with you during ultrasound (echo/ vascular) testing. Due to room size and safety concerns, children are not allowed in the ultrasound rooms during exams. Our front office staff cannot provide observation of children in our lobby area while testing is being conducted. An adult accompanying a patient to their appointment will only be allowed in the ultrasound room at the discretion of the ultrasound technician under special circumstances. We apologize for any inconvenience.   Follow-Up: At [facility], you and your health needs are our priority.  As part of our continuing mission to provide you with exceptional heart care, our providers are all part of one team.  This team includes your primary Cardiologist (physician) and Advanced Practice Providers or APPs (Physician Assistants and Nurse Practitioners) who all work together to provide you with the care you need, when you need it.  Your next appointment:   1 year(s)  Provider:   Ozell Fell, MD     Signed, Ozell Fell, MD  02/01/2024 1:56 PM    [facility]     [1]  Current Meds  Medication Sig   gabapentin  (NEURONTIN ) 300 MG capsule Take 2 capsules (600 mg total) by mouth at bedtime.   levonorgestrel  (MIRENA ) 20 MCG/24HR IUD 1 each by Intrauterine route once.   levothyroxine  (SYNTHROID ) 150 MCG tablet Take 1 tablet (150 mcg total) by mouth daily before breakfast. TAKE 1 TABLET(150 MCG) BY MOUTH EVERY MORNING   losartan  (COZAAR ) 25 MG tablet TAKE 1 TABLET(25 MG) BY MOUTH DAILY   metoprolol  succinate (TOPROL -XL) 25 MG 24 hr tablet TAKE 1 TABLET(25 MG) BY MOUTH DAILY   omeprazole  (PRILOSEC) 40 MG capsule TAKE 1 CAPSULE(40 MG) BY MOUTH TWICE DAILY   "

## 2024-02-01 NOTE — Patient Instructions (Signed)
 Medication Instructions:  No medication changes were made at this visit. Continue current regimen.   *If you need a refill on your cardiac medications before your next appointment, please call your pharmacy*  Lab Work: None ordered today. If you have labs (blood work) drawn today and your tests are completely normal, you will receive your results only by: MyChart Message (if you have MyChart) OR A paper copy in the mail If you have any lab test that is abnormal or we need to change your treatment, we will call you to review the results.  Testing/Procedures: Your physician has requested that you have a stress echocardiogram. For further information please visit [email or web address] Please follow instruction sheet as given.  Please note: We ask at that you not bring children with you during ultrasound (echo/ vascular) testing. Due to room size and safety concerns, children are not allowed in the ultrasound rooms during exams. Our front office staff cannot provide observation of children in our lobby area while testing is being conducted. An adult accompanying a patient to their appointment will only be allowed in the ultrasound room at the discretion of the ultrasound technician under special circumstances. We apologize for any inconvenience.   Follow-Up: At [facility], you and your health needs are our priority.  As part of our continuing mission to provide you with exceptional heart care, our providers are all part of one team.  This team includes your primary Cardiologist (physician) and Advanced Practice Providers or APPs (Physician Assistants and Nurse Practitioners) who all work together to provide you with the care you need, when you need it.  Your next appointment:   1 year(s)  Provider:   Ozell Fell, MD

## 2024-02-01 NOTE — Assessment & Plan Note (Addendum)
 S/P CABG, remote hx of spontaneous coronary artery dissection.  With her recent symptoms of breathlessness, increased throat burning possibly related to GERD but still warranting evaluation, and atypical chest discomfort that occurred during exercise, I think stress testing is indicated.  I have recommended an exercise stress echocardiogram which she had in 2022 and was negative for ischemia at that time.  Continue current medical therapy.

## 2024-02-08 ENCOUNTER — Other Ambulatory Visit

## 2024-02-15 ENCOUNTER — Other Ambulatory Visit

## 2024-03-11 ENCOUNTER — Other Ambulatory Visit (HOSPITAL_COMMUNITY)

## 2024-03-11 ENCOUNTER — Ambulatory Visit (HOSPITAL_COMMUNITY)
